# Patient Record
Sex: Male | Born: 1960 | Race: White | Hispanic: No | Marital: Married | Smoking: Current some day smoker
Health system: Southern US, Community
[De-identification: ages and names within clinical notes are randomized; demographics above are authoritative.]

## PROBLEM LIST (undated history)

## (undated) DIAGNOSIS — R918 Other nonspecific abnormal finding of lung field: Secondary | ICD-10-CM

## (undated) DIAGNOSIS — I10 Essential (primary) hypertension: Secondary | ICD-10-CM

## (undated) DIAGNOSIS — K219 Gastro-esophageal reflux disease without esophagitis: Secondary | ICD-10-CM

## (undated) DIAGNOSIS — I2510 Atherosclerotic heart disease of native coronary artery without angina pectoris: Secondary | ICD-10-CM

## (undated) DIAGNOSIS — E785 Hyperlipidemia, unspecified: Secondary | ICD-10-CM

## (undated) DIAGNOSIS — Z8739 Personal history of other diseases of the musculoskeletal system and connective tissue: Secondary | ICD-10-CM

## (undated) DIAGNOSIS — I219 Acute myocardial infarction, unspecified: Secondary | ICD-10-CM

## (undated) DIAGNOSIS — I2699 Other pulmonary embolism without acute cor pulmonale: Secondary | ICD-10-CM

## (undated) HISTORY — PX: COLONOSCOPY W/ POLYPECTOMY: SHX1380

## (undated) HISTORY — DX: Hyperlipidemia, unspecified: E78.5

## (undated) HISTORY — DX: Essential (primary) hypertension: I10

## (undated) HISTORY — PX: CORONARY ANGIOPLASTY WITH STENT PLACEMENT: SHX49

---

## 1999-09-03 ENCOUNTER — Encounter: Admission: RE | Admit: 1999-09-03 | Discharge: 1999-09-03 | Payer: Self-pay | Admitting: Gastroenterology

## 1999-09-03 ENCOUNTER — Encounter: Payer: Self-pay | Admitting: Gastroenterology

## 2000-10-28 ENCOUNTER — Encounter: Payer: Self-pay | Admitting: Gastroenterology

## 2000-10-28 ENCOUNTER — Ambulatory Visit (HOSPITAL_COMMUNITY): Admission: RE | Admit: 2000-10-28 | Discharge: 2000-10-28 | Payer: Self-pay | Admitting: Gastroenterology

## 2000-12-27 ENCOUNTER — Ambulatory Visit (HOSPITAL_COMMUNITY): Admission: RE | Admit: 2000-12-27 | Discharge: 2000-12-27 | Payer: Self-pay | Admitting: Gastroenterology

## 2002-07-27 HISTORY — PX: ESOPHAGOGASTRODUODENOSCOPY: SHX1529

## 2005-03-01 ENCOUNTER — Inpatient Hospital Stay (HOSPITAL_COMMUNITY): Admission: EM | Admit: 2005-03-01 | Discharge: 2005-03-03 | Payer: Self-pay | Admitting: Emergency Medicine

## 2005-03-02 ENCOUNTER — Encounter (INDEPENDENT_AMBULATORY_CARE_PROVIDER_SITE_OTHER): Payer: Self-pay | Admitting: Cardiology

## 2008-09-24 ENCOUNTER — Encounter: Admission: RE | Admit: 2008-09-24 | Discharge: 2008-09-24 | Payer: Self-pay | Admitting: Family Medicine

## 2010-12-12 NOTE — H&P (Signed)
[name], [name]                 ACCOUNT NO.:  [id number]   MEDICAL RECORD NO.:  [id number]          PATIENT TYPE:  INP   LOCATION:  1825                         FACILITY:  [facility]   PHYSICIAN:  Melissa L. Ladona Ridgel, MD  DATE OF BIRTH:  12-17-1960   DATE OF ADMISSION:  02/28/2005  DATE OF DISCHARGE:                                HISTORY & PHYSICAL   CHIEF COMPLAINT:  Chest pain.   PRIMARY CARE PHYSICIAN:  Dr. Manus Gunning.   HISTORY OF PRESENT ILLNESS:  The patient is a 50 year old white male with a  past medical history for hypertension, hypercholesterolemia, and GERD. The  patient states that he awoke at 1 a.m. on the night prior to admission with  substernal central chest pain radiating to both shoulders. The pain was 10  out of 10.  It was exacerbated by nothing and relieved over time. The  patient states that he got up and walked around, then he tried to go to  sleep in a recliner but really could not go back to sleep. The patient  states that he got up and went to work this morning. 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 The patient states  that he went home, sat down in a chair to rest and developed the chest pain  again, and therefore came in to the emergency room for further evaluation.   REVIEW OF SYSTEMS:  Reveals no weight loss or weight gain, no fevers, no  chills. No nausea or vomiting, no diaphoresis, no paroxysmal nocturnal  dyspnea, no orthopnea. Denies any dysuria, no hematochezia. All other review  of systems are negative.   PAST MEDICAL HISTORY:  1.  Hypertension.  2.  Hypercholesterolemia.  3.  Gastroesophageal reflux disease.   PAST SURGICAL HISTORY:  None.   SOCIAL HISTORY:  He smokes 1.5 packs per day. He denies any ethanol use at  this time. However he has had trouble with ethanol in the past. He does  smoke marijuana. 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   FAMILY HISTORY:  Mom is living with hypertension. Dad  deceased secondary to  alcohol abuse.   ALLERGIES:  No known drug allergies.   MEDICATIONS:  1.  Nexium 40 mg daily.  2.  Atenolol/hydrochlorothiazide 100/25 mg daily.  3.  Lovastatin 40 mg daily.  4.  Back in April he did have a prescription for Antabuse.   PHYSICAL EXAMINATION:  VITAL SIGNS:  Temperature is 97.1, blood pressure  148/95, pulse 56, respirations 24, saturation 100%.  GENERAL:  This is a well-developed, well-nourished white male in no acute  distress.  HEENT:  Normocephalic, atraumatic. Pupils are equal, round and reactive to  light. Extraocular movements are intact. Mucous membranes are moist. Poor  dentition.  NECK:  Supple with no JVD, no lymph nodes, no carotid bruits.  CHEST:  Clear to auscultation, no rhonchi, wheezes.  CARDIOVASCULAR:  Regular rate and rhythm, positive S1, S2, no S3, S4 noted,  no murmurs, rubs, or gallops.  ABDOMEN:  Soft, nontender, nondistended with positive  bowel sounds.  EXTREMITIES:  Show no clubbing, cyanosis or edema.  NEUROLOGIC:  He is awake, alert, oriented. Cranial nerves II-XII are intact.  Power is 5/5. Deep tendon reflexes are 2+.   LABORATORY DATA:  Sodium is 140, potassium is 2.9, chloride is 103, CO2 is  31, BUN is 16, creatinine 0.9, glucose is 120. Will check a CBC as point of  care enzymes myoglobin is 44.7. CK-MB of 33.3 and a troponin of 0.39 which  is slightly elevated. EKG shows sinus bradycardia with minor ST depressions  in V2, really nothing to speak about.   His chest x-ray shows a 7 mm nodule at the left base; need to repeat films  with nipple markers.   ASSESSMENT:  This is a 50 year old white male with past medical history for  hypertension, hypercholesterolemia, and heavy tobacco use who presents with  unstable angina without acute EKG changes.  1.  Cardiovascular. Unstable angina with increased troponin. No acute EKG      changes at this time. I agree with a nitroglycerin drip and heparin.      Will  check an echo. Continue his aspirin and Atenolol,      hydrochlorothiazide, and consult cardiology in the a.m.  2.  Pulmonary. The lung nodule needs follow up x-ray and we will recommend      tobacco cessation and a Nicoderm patch.  3.  Gastrointestinal. Has a history of gastroesophageal reflux disease. We      will continue either Nexium or Protonix.  4.  GU which is stable.  5.  Endocrine. Will check a TSH and a fasting lipid panel.  6.  The patient is a full code and full care.  7.  Will replace his potassium p.o. and IV p.r.n.       MLT/MEDQ  D:  03/01/2005  T:  03/01/2005  Job:  [id number]   cc:   Angelia Mould. Derrell Lolling, M.D.  [address]  [place]  [place] [address]

## 2010-12-12 NOTE — Procedures (Signed)
[place]  Patient:    [name], [name]                        MRN: [id number] Proc. Date: 12/27/00 Adm. Date:  [id number] Attending:  Nelda Marseille CC:         Dyanne Carrel, M.D.   Procedure Report  PROCEDURE PERFORMED:  Esophagogastroduodenoscopy with biopsies.  ENDOSCOPIST:  Petra Kuba, M.D.  INDICATIONS FOR PROCEDURE:  Morning nausea and vomiting, questionable etiology.  Consent was signed after risks, benefits, methods, and options were thoroughly discussed in the office.  MEDICATIONS USED:  Demerol 100 mg, Versed 10 mg.  DESCRIPTION OF PROCEDURE:  The video endoscope was inserted by direct vision, the esophagus was normal.  In the distal esophagus was a small hiatal hernia. Scope passed into the stomach and advanced through the antrum pertinent for some minimal antritis through a normal pylorus into a normal duodenal bulb and around the C-loop to a normal second portion of the duodenum.  The scope was withdrawn back to the bulb and a good look there ruled out abnormalities in that location.  Scope was withdrawn back to the stomach and retroflexed.  High in the cardia, the hiatal hernia was confirmed.  The fundus, angularis, lesser and greater curve were normal except for some mild gastritis.  Scope was straightened and straight visualization of the stomach confirmed the gastritis, ruled out any other abnormalities.  The scope was advanced to the antrum.  One biopsy for the CLO test was obtained to rule out Helicobacter pylori.  Scope was then slowly withdrawn.  Again a good look at the esophagus was normal except for the small hiatal hernia.  Scope was removed.  The patient tolerated the procedure well.  There was no obvious immediate complications.  ENDOSCOPIC DIAGNOSIS: 1. Small hiatal hernia. 2. Gastritis, antritis, status post CLO biopsy. 3. Otherwise normal esophagogastroduodenoscopy.  PLAN:  Increase head of  bed to 30 degrees to see if that helps.  Await CLO test and although I dont think treatment if positive would be helpful, willing to try.  Consider a trial of antidepressants next versus following up with Dr. Manus Gunning to see if he has any other ideas, work-up or plans or even sending him for a second opinion since I dont know of any other etiology to check for which I have discussed with Mr. Antonelli.  Possibly he is just so used to throwing up from his drinking, he has trained himself to throw up in the morning. DD:  12/27/00 TD:  12/27/00 Job: [id number]

## 2010-12-12 NOTE — Discharge Summary (Signed)
NAMEKEIONTE, SWICEGOOD                 ACCOUNT NO.:  [id number]   MEDICAL RECORD NO.:  [id number]          PATIENT TYPE:  INP   LOCATION:  2928                         FACILITY:  [facility]   PHYSICIAN:  Corinna L. Lendell Caprice, MDDATE OF BIRTH:  [birth year]   DATE OF ADMISSION:  02/28/2005  DATE OF DISCHARGE:  03/03/2005                                 DISCHARGE SUMMARY   DIAGNOSES:  1.  Non-ST segment elevation myocardial infarction.  2.  Hyperlipidemia.  3.  Hypertension.  4.  Gastroesophageal reflux disease.  5.  Hypokalemia.  6.  Tobacco abuse, counseled against.   DISCHARGE MEDICATIONS:  1.  Aspirin 325 milligrams a day.  2.  Lovastatin 80 milligrams a day.  3.  KCl 20 mEq a day.  4.  Plavix 75 milligrams a day.  5.  He is to continue Nexium 40 milligrams a day.  6.  Atenolol 50 milligrams a day.  7.  Hydrochlorothiazide 25 milligrams a day.   FOLLOW UP:  Dr. Amil Amen on March 20, 2005. Follow up with the [facility]  Cardiology Lab on March 10, 2005.   CONDITION:  Stable.   DIET:  Heart-healthy.   CONSULTATIONS:  Dr. Corliss Marcus.   PROCEDURE:  Cardiac catheterization on March 02, 2005 which showed two-  vessel atherosclerotic cardiovascular disease status post stent placement in  the mid-left circumflex marginal with normal ejection fraction.   PERTINENT LABORATORY DATA:  Urine drug screen positive for cannabinoids.  Troponin peaked at 0.97. CPK was 153. Peak CPK-MB was 6.3, index was 4.1.  Triglycerides 160, total cholesterol 176, HDL 29, LDL 115. TSH 2.5. Basic  metabolic panel significant for a potassium of 2.9, otherwise unremarkable.  Uric acid at 7.1. Coagulation panel normal. CBC was significant for white  count of 11,000, otherwise unremarkable. Special studies in radiology:  Chest x-ray showed a subcentimeter nodular density at the left base but with  no nipple markers, nodule, probable bronchitis. An EKG showed sinus  bradycardia with short PR interval, right  ventricular conduction delay.   HISTORY AND HOSPITAL COURSE:  Mr. Maffei is a 50 year old white male with  history of hypertension, hyperlipidemia who woke up with substernal chest  pain radiating to his shoulders. Please see H&P for complete details. His  vital signs were significant for a blood pressure 148/95, pulse 56. Fairly  unremarkable physical examination. He was noted to be hyperkalemic and had  elevated troponin. The patient was admitted to the hospital with  nitroglycerin drip and heparin drip. Cardiology was consulted. He had a  cardiac catheterization and stent placement as above. At the time of  discharge, the patient had no further chest pain due to his elevated LDL.  His Zocor was increased. He also was started on aspirin and Plavix. He also  had an echocardiogram which showed normal LV function and no significant  valvular disease or wall motion abnormalities.   The patient's potassium was repleted.      Corinna L. Lendell Caprice, MD  Electronically Signed     CLS/MEDQ  D:  07/08/2005  T:  07/09/2005  Job:  [id number]  cc:   Bryan Lemma. Manus Gunning, M.D.  Fax: [phone]

## 2010-12-12 NOTE — Cardiovascular Report (Signed)
[name], Adam NO.:  [id number]   Adam RECORD NO.:  [id number]          PATIENT TYPE:  INP   LOCATION:  2928                         FACILITY:  [facility]   PHYSICIAN:  Francisca December, M.D.  DATE OF BIRTH:  1960-08-18   DATE OF PROCEDURE:  03/02/2005  DATE OF DISCHARGE:                              CARDIAC CATHETERIZATION   PROCEDURES PERFORMED:  1.  Left heart catheterization.  2.  Left ventriculogram.  3.  Coronary angiography.  4.  PCI/DE stent implantation midleft circumflex marginal.   INDICATION:  [name] is a 50 year old man was admitted to on March 01, 2005 after prolonged chest pain. He is ruled in for non-ST-segment  myocardial infarction by elevated troponin level. He was brought to the  cardiac catheterization laboratory at this time to identify the extent of  disease and provide further therapeutic options.   PROCEDURE NOTE:  The patient is brought to the cardiac catheterization  laboratory in the fasting state. The right groin was prepped and draped in  the usual sterile fashion. Local anesthesia was obtained with infiltration  of 1% lidocaine. A 6-French catheter sheath was inserted percutaneously into  the right femoral artery utilizing an anterior  approach over a guiding J-  wire. Left heart catheterization then proceeded in the standard fashion,  using a 6-French 110 cm pigtail catheter and 6-French #4 left and right  Judkins catheters. All catheter manipulations were performed using  fluoroscopic observation and exchanges performed over a long guiding J-wire.   I then proceeded with coronary intervention. A 6-French #4 CLS guiding  catheter was advanced to the ascending aorta where left coronary os was  engaged. A 0.014 inch Luge and intracoronary guidewire was passed across the  lesion in the left circumflex marginal branch without difficulty. The  patient received 4000 units of heparin and a double bolus and constant  infusion of Integrilin. The resultant ACT was 286 seconds. The lesion was  directly stented using a 3.0/16 mm Scimed TAXUS intracoronary drug-eluting  stent. This was inflated to peak pressure of 14 atmospheres. Following  deflation and removal of the balloon, adequate patency was confirmed in  orthogonal views both with and without the guidewire in place. The guiding  catheter was then removed. A right femoral arteriogram was obtained, but  showed extensive atherosclerotic plaque at the entry site of the catheter  sheath. It was therefore sutured into place. The patient was transported to  the recovery area in stable condition.   HEMODYNAMIC RESULTS:  Systemic arterial pressure was 119/76 with a mean of  95 mmHg. There was no systolic gradient across the aortic valve. Left  ventricular end-diastolic pressure was 8 mmHg preventriculogram.   ANGIOGRAPHY:  The left ventriculogram demonstrated normal chamber size and  normal global systolic function without regional wall motion abnormality.  Visual estimated ejection fraction 65-70%. There is left coronary  calcification seen. There is no mitral regurgitation.   There was a right dominant coronary system present. The main left coronary  artery was normal   The left anterior descending artery and its  branches were moderately  diseased; the vessel contains angulated 40-50% stenosis in the midportion  just before the origin of a moderate-sized diagonal branch which has a 60-  70% stenosis at its origin. The ongoing anterior descending artery reaches  and traverses the apex. It is diffusely diseased.   The left circumflex coronary and its branches are highly diseased; the  vessel really gives rise to a single large marginal branch. In the  midportion of this, there is a tapering focal 80-90% stenosis. The ongoing  vessel is quite large.   The right coronary and its branches are moderately diseased as well. There  is a 40% stenosis in the  proximal portion. There is diffuse disease in the  junction of the mid and distal segments but without significant obstruction.  The distal vessel gives rise to a moderate size posterior descending artery  and a moderate size posterolateral segment with two left ventricular  branches, both of which are relatively small. The poster descending artery  has a 60-70% stenosis at its origin.   Collateral vessels are not seen.   Following balloon dilatation and stent implantation in the circumflex  marginal branch, there was no residual stenosis.   FINAL IMPRESSION:  1.  Atherosclerotic cardiovascular disease, two-vessel.  2.  Status post successful percutaneous coronary intervention drug-eluting      stent implantation mid-left circumflex marginal.  3.  Intact ventricular size and global systolic function. Ejection fraction      65-70%.  4.  Typical angina was reproduced with device insertion and balloon      inflation.      Francisca December, M.D.  Electronically Signed     JHE/MEDQ  D:  03/02/2005  T:  03/03/2005  Job:  [id number]   cc:   [name]. [name], M.D.  [address] [place]  [place] [address]  Fax: [phone]

## 2010-12-12 NOTE — Consult Note (Signed)
NAMETOMMEY, BARRET NO.:  [id number]   MEDICAL RECORD NO.:  [id number]          PATIENT TYPE:  INP   LOCATION:  2928                         FACILITY:  [facility]   PHYSICIAN:  Francisca December, M.D.  DATE OF BIRTH:  1960/09/17   DATE OF CONSULTATION:  03/01/2005  DATE OF DISCHARGE:                                   CONSULTATION   REASON FOR CONSULTATION:  Chest pain.   HISTORY OF PRESENT ILLNESS:  [name] is a 50 year old male without  prior cardiac history, who late Friday, February 27, 2005, to early Saturday,  February 28, 2005, developed the onset of anterior substernal aching chest pain  that radiated out to both shoulders.  This was spontaneous in nature.  He  felt the pain was severe enough that he could not sit or lie.  He got up to  walk.  It did not generally help much to walk.  The pain would wax and wane.  Each episode would last 2 to 3 minutes and then resolve.  He would sit down  to rest, and again it would start again within 10 or 15 minutes.  This  occurred multiple times Friday evening until he finally got up and got ready  for work Saturday morning.  He went to work all day and did not have any  discomfort.  However, around 10 p.m. Saturday evening, it began again, and  he presented to the emergency room around 1 a.m.  The pain was not  associated with any nausea or diaphoresis.  No particular shortness of  breath.   Cardiac risk factors are age, sex, hypertension, and tobacco abuse.  There  is no early family history of coronary disease.  He does have  hypercholesterolemia as well.   PAST MEDICAL HISTORY:  1.  Hypertension.  2.  Hypercholesterolemia.  3.  GERD.   PAST SURGICAL HISTORY:  None.   ALLERGIES:  None known.   CURRENT MEDICATIONS:  1.  Nexium 40 mg p.o. daily.  2.  Atenolol/hydrochlorothiazide 100/25 mg one p.o. daily.  3.  Lovastatin 40 mg p.o. daily.   SOCIAL HISTORY:  Smokes 1-1/2 packs of cigarettes daily.  No ethanol.   Does  use occasional cannabis.  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   FAMILY HISTORY:  Father died of ETOH abuse.  Mother is alive with  hypertension.   REVIEW OF SYSTEMS:  Otherwise negative.   PHYSICAL EXAMINATION:  GENERAL:  This is a well-appearing, thin 50 year old  man in no acute distress.  VITAL SIGNS:  Blood pressure 136/82.  Pulse 55 and regular.  Respiratory  rate 18.  Temperature 97.8.  HEENT:  Unremarkable.  Head is atraumatic and normocephalic.  Pupils are  equal and reactive to light and accommodation.  Extraocular movements are  intact.  Sclerae are anicteric.  Oral mucosa is pink and moist.  Tongue is  not coated.  NECK:  Supple without thyromegaly or masses.  Carotid upstrokes are normal.  There is no bruit.  There is no jugular  venous distention.  CHEST:  Clear with adequate excursion.  HEART:  Regular rhythm.  Normal S1 and S2 heard.  No S3, S4, murmur, click,  or rub noted.  ABDOMEN:  Soft, scaphoid, nontender.  No midline pulsatile mass.  GU:  External genitalia without lesions.  RECTAL:  Not performed.  EXTREMITIES:  Full range of motion.  No edema.  Intact distal pulses.  NEUROLOGIC:  Cranial nerves 2-12 are intact.  Motor and sensory grossly  intact.  Gait not tested.  SKIN:  Warm, dry, and clear.   LABORATORY DATA:  Electrocardiogram on admission showed normal sinus rhythm.  There are small Q-waves in I and aVL.  Admission hemogram within normal  limits.  Potassium on admission was 2.5.  It was increased to 2.9 this  morning.  Serum electrolytes otherwise normal.  BUN and creatinine normal.  On initial point of care enzymes in the emergency room, troponin was 0.39,  CK/MB 3.3, myoglobin 44.  Subsequent troponin drawn in the CCU is 0.78 with  a CK/MB of 6.3 and a total of 153.  Additional CK/MB pending at this time.  Chest x-ray with no active cardiopulmonary disease.  There may be a 7-mm  nodule at the left base.    ASSESSMENT:  1.  Acute coronary syndrome/non-ST elevation myocardial infarction.  2.  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  3.  Hypertension.  4.  Tobacco abuse.  5.  Lung nodule, left base.  6.  Hyperlipidemia.  7.  Gastroesophageal reflux disease.   PLAN:  1.  Agree with your management thus far, which includes IV nitroglycerin, IV      heparin, and aspirin.  Continue beta blocker.  2.  Repeat CK/MB and troponin today.  3.  Have counseled him for cardiac catheterization, coronary angiography,      and possible percutaneous coronary intervention.  Goals, risks, and      alternatives were reviewed.  The patient states his understanding, has      had his questions answered, and wishes to proceed.       JHE/MEDQ  D:  03/01/2005  T:  03/01/2005  Job:  [id number]

## 2011-06-21 ENCOUNTER — Other Ambulatory Visit: Payer: Self-pay

## 2011-06-21 ENCOUNTER — Encounter (HOSPITAL_COMMUNITY): Admission: EM | Disposition: A | Discharge status: Home / Self Care | Payer: Self-pay | Source: Ambulatory Visit

## 2011-06-21 ENCOUNTER — Inpatient Hospital Stay (HOSPITAL_COMMUNITY)
Admission: EM | Admit: 2011-06-21 | Discharge: 2011-06-24 | DRG: 853 | Disposition: A | Discharge status: Home / Self Care | Payer: BC Managed Care – PPO | Attending: Cardiovascular Disease | Admitting: Cardiovascular Disease

## 2011-06-21 ENCOUNTER — Inpatient Hospital Stay (HOSPITAL_COMMUNITY)
Admission: EM | Admit: 2011-06-21 | Discharge: 2011-06-21 | Disposition: A | Discharge status: Home / Self Care | Payer: No Typology Code available for payment source | Source: Ambulatory Visit

## 2011-06-21 ENCOUNTER — Encounter (HOSPITAL_COMMUNITY)
Admission: EM | Disposition: A | Discharge status: Home / Self Care | Payer: Self-pay | Source: Home / Self Care | Attending: Cardiovascular Disease

## 2011-06-21 ENCOUNTER — Emergency Department (HOSPITAL_COMMUNITY): Payer: BC Managed Care – PPO

## 2011-06-21 ENCOUNTER — Encounter: Payer: Self-pay | Admitting: *Deleted

## 2011-06-21 ENCOUNTER — Ambulatory Visit (HOSPITAL_COMMUNITY): Admit: 2011-06-21 | Payer: Self-pay | Admitting: Cardiovascular Disease

## 2011-06-21 DIAGNOSIS — K219 Gastro-esophageal reflux disease without esophagitis: Secondary | ICD-10-CM | POA: Diagnosis present

## 2011-06-21 DIAGNOSIS — R079 Chest pain, unspecified: Secondary | ICD-10-CM

## 2011-06-21 DIAGNOSIS — F17200 Nicotine dependence, unspecified, uncomplicated: Secondary | ICD-10-CM | POA: Diagnosis present

## 2011-06-21 DIAGNOSIS — E7800 Pure hypercholesterolemia, unspecified: Secondary | ICD-10-CM | POA: Diagnosis present

## 2011-06-21 DIAGNOSIS — E785 Hyperlipidemia, unspecified: Secondary | ICD-10-CM | POA: Diagnosis present

## 2011-06-21 DIAGNOSIS — Z9861 Coronary angioplasty status: Secondary | ICD-10-CM

## 2011-06-21 DIAGNOSIS — I2510 Atherosclerotic heart disease of native coronary artery without angina pectoris: Secondary | ICD-10-CM

## 2011-06-21 DIAGNOSIS — I252 Old myocardial infarction: Secondary | ICD-10-CM

## 2011-06-21 DIAGNOSIS — R110 Nausea: Secondary | ICD-10-CM | POA: Diagnosis not present

## 2011-06-21 DIAGNOSIS — Z7982 Long term (current) use of aspirin: Secondary | ICD-10-CM

## 2011-06-21 DIAGNOSIS — I10 Essential (primary) hypertension: Secondary | ICD-10-CM | POA: Diagnosis present

## 2011-06-21 DIAGNOSIS — I213 ST elevation (STEMI) myocardial infarction of unspecified site: Secondary | ICD-10-CM

## 2011-06-21 DIAGNOSIS — I2119 ST elevation (STEMI) myocardial infarction involving other coronary artery of inferior wall: Principal | ICD-10-CM | POA: Diagnosis present

## 2011-06-21 DIAGNOSIS — Z7902 Long term (current) use of antithrombotics/antiplatelets: Secondary | ICD-10-CM

## 2011-06-21 HISTORY — DX: Atherosclerotic heart disease of native coronary artery without angina pectoris: I25.10

## 2011-06-21 HISTORY — PX: LEFT HEART CATH: SHX5478

## 2011-06-21 LAB — CBC
HCT: 37.7 % — ABNORMAL LOW (ref 39.0–52.0)
Hemoglobin: 13.4 g/dL (ref 13.0–17.0)
MCH: 33 pg (ref 26.0–34.0)
MCHC: 35.5 g/dL (ref 30.0–36.0)
MCV: 92.9 fL (ref 78.0–100.0)
RDW: 11.9 % (ref 11.5–15.5)

## 2011-06-21 LAB — DIFFERENTIAL
Basophils Absolute: 0 10*3/uL (ref 0.0–0.1)
Basophils Relative: 0 % (ref 0–1)
Eosinophils Absolute: 0.1 10*3/uL (ref 0.0–0.7)
Eosinophils Relative: 1 % (ref 0–5)
Monocytes Absolute: 1.2 10*3/uL — ABNORMAL HIGH (ref 0.1–1.0)
Monocytes Relative: 7 % (ref 3–12)
Neutro Abs: 14.2 10*3/uL — ABNORMAL HIGH (ref 1.7–7.7)

## 2011-06-21 LAB — CARDIAC PANEL(CRET KIN+CKTOT+MB+TROPI)
CK, MB: 39.4 ng/mL (ref 0.3–4.0)
Relative Index: 3.1 — ABNORMAL HIGH (ref 0.0–2.5)
Total CK: 1266 U/L — ABNORMAL HIGH (ref 7–232)
Troponin I: >25 ng/mL (ref <0.30)

## 2011-06-21 LAB — PROTIME-INR
INR: 1.31 (ref 0.00–1.49)
Prothrombin Time: 16.5 seconds — ABNORMAL HIGH (ref 11.6–15.2)

## 2011-06-21 LAB — COMPREHENSIVE METABOLIC PANEL
AST: 114 U/L — ABNORMAL HIGH (ref 0–37)
Albumin: 4 g/dL (ref 3.5–5.2)
BUN: 21 mg/dL (ref 6–23)
Calcium: 9.1 mg/dL (ref 8.4–10.5)
Creatinine, Ser: 0.97 mg/dL (ref 0.50–1.35)
Total Bilirubin: 0.2 mg/dL — ABNORMAL LOW (ref 0.3–1.2)
Total Protein: 6.4 g/dL (ref 6.0–8.3)

## 2011-06-21 LAB — APTT: aPTT: 75 seconds — ABNORMAL HIGH (ref 24–37)

## 2011-06-21 SURGERY — LEFT AND RIGHT HEART CATHETERIZATION WITH CORONARY ANGIOGRAM
Anesthesia: LOCAL

## 2011-06-21 SURGERY — LEFT HEART CATH
Anesthesia: LOCAL

## 2011-06-21 MED ORDER — SODIUM CHLORIDE 0.9 % IV SOLN
250.0000 mL | INTRAVENOUS | Status: DC
  Administered 2011-06-21 – 2011-06-23 (×2): 1000 mL via INTRAVENOUS

## 2011-06-21 MED ORDER — HEPARIN SODIUM (PORCINE) 5000 UNIT/ML IJ SOLN
5000.0000 [IU] | Freq: Three times a day (TID) | INTRAMUSCULAR | Status: DC
  Administered 2011-06-22 – 2011-06-24 (×8): 5000 [IU] via SUBCUTANEOUS
  Filled 2011-06-21 (×11): qty 1

## 2011-06-21 MED ORDER — MIDAZOLAM HCL 2 MG/2ML IJ SOLN
INTRAMUSCULAR | Status: AC
  Filled 2011-06-21: qty 2

## 2011-06-21 MED ORDER — METOPROLOL TARTRATE 25 MG PO TABS: 25.0000 mg | ORAL_TABLET | Freq: Two times a day (BID) | ORAL | Status: DC

## 2011-06-21 MED ORDER — MORPHINE SULFATE 4 MG/ML IJ SOLN
4.0000 mg | Freq: Once | INTRAMUSCULAR | Status: AC
  Administered 2011-06-21: 2 mg via INTRAVENOUS

## 2011-06-21 MED ORDER — ONDANSETRON HCL 4 MG/2ML IJ SOLN
4.0000 mg | Freq: Once | INTRAMUSCULAR | Status: AC
  Administered 2011-06-21: 4 mg via INTRAVENOUS

## 2011-06-21 MED ORDER — HEPARIN BOLUS VIA INFUSION
4000.0000 [IU] | Freq: Once | INTRAVENOUS | Status: AC
  Administered 2011-06-21: 4000 [IU] via INTRAVENOUS
  Filled 2011-06-21: qty 4000

## 2011-06-21 MED ORDER — LIDOCAINE HCL (PF) 1 % IJ SOLN
INTRAMUSCULAR | Status: AC
  Filled 2011-06-21: qty 30

## 2011-06-21 MED ORDER — MORPHINE SULFATE 4 MG/ML IJ SOLN
INTRAMUSCULAR | Status: AC
  Administered 2011-06-22: 2 mg via INTRAVENOUS
  Filled 2011-06-21: qty 1

## 2011-06-21 MED ORDER — ALPRAZOLAM 0.25 MG PO TABS
0.2500 mg | ORAL_TABLET | Freq: Two times a day (BID) | ORAL | Status: DC | PRN
  Administered 2011-06-23 – 2011-06-24 (×2): 0.25 mg via ORAL
  Filled 2011-06-21 (×2): qty 1

## 2011-06-21 MED ORDER — METOPROLOL TARTRATE 12.5 MG HALF TABLET
12.5000 mg | ORAL_TABLET | Freq: Two times a day (BID) | ORAL | Status: DC
  Administered 2011-06-21: 12.5 mg via ORAL
  Filled 2011-06-21 (×3): qty 1

## 2011-06-21 MED ORDER — NITROGLYCERIN 0.4 MG SL SUBL
SUBLINGUAL_TABLET | SUBLINGUAL | Status: AC
  Filled 2011-06-21: qty 25

## 2011-06-21 MED ORDER — HEPARIN (PORCINE) IN NACL 2-0.9 UNIT/ML-% IJ SOLN
INTRAMUSCULAR | Status: AC
  Filled 2011-06-21: qty 2000

## 2011-06-21 MED ORDER — ACETAMINOPHEN 325 MG PO TABS
650.0000 mg | ORAL_TABLET | ORAL | Status: DC | PRN
  Administered 2011-06-23: 650 mg via ORAL
  Filled 2011-06-21: qty 2

## 2011-06-21 MED ORDER — FENTANYL CITRATE 0.05 MG/ML IJ SOLN
INTRAMUSCULAR | Status: AC
  Filled 2011-06-21: qty 2

## 2011-06-21 MED ORDER — PRASUGREL HCL 10 MG PO TABS
ORAL_TABLET | ORAL | Status: AC
  Administered 2011-06-22: 10 mg via ORAL
  Filled 2011-06-21: qty 6

## 2011-06-21 MED ORDER — ROSUVASTATIN CALCIUM 40 MG PO TABS: 40.0000 mg | ORAL_TABLET | Freq: Every day | ORAL | Status: DC

## 2011-06-21 MED ORDER — NITROGLYCERIN 0.2 MG/ML ON CALL CATH LAB
INTRAVENOUS | Status: AC
  Filled 2011-06-21: qty 1

## 2011-06-21 MED ORDER — ASPIRIN EC 81 MG PO TBEC
81.0000 mg | DELAYED_RELEASE_TABLET | Freq: Every day | ORAL | Status: DC
  Administered 2011-06-22 – 2011-06-24 (×3): 81 mg via ORAL
  Filled 2011-06-21 (×3): qty 1

## 2011-06-21 MED ORDER — ASPIRIN 300 MG RE SUPP
300.0000 mg | RECTAL | Status: AC
  Filled 2011-06-21: qty 1

## 2011-06-21 MED ORDER — MIDAZOLAM HCL 2 MG/2ML IJ SOLN
INTRAMUSCULAR | Status: AC
  Filled 2011-06-21: qty 1

## 2011-06-21 MED ORDER — ONDANSETRON HCL 4 MG/2ML IJ SOLN
4.0000 mg | Freq: Four times a day (QID) | INTRAMUSCULAR | Status: DC | PRN
  Administered 2011-06-22: 4 mg via INTRAVENOUS
  Filled 2011-06-21 (×2): qty 2

## 2011-06-21 MED ORDER — NITROGLYCERIN 0.4 MG SL SUBL
0.4000 mg | SUBLINGUAL_TABLET | SUBLINGUAL | Status: DC | PRN
  Administered 2011-06-21: 19:00:00 via SUBLINGUAL
  Administered 2011-06-21: 0.4 mg via SUBLINGUAL

## 2011-06-21 MED ORDER — SODIUM CHLORIDE 0.9 % IJ SOLN: 3.0000 mL | INTRAMUSCULAR | Status: DC | PRN

## 2011-06-21 MED ORDER — MORPHINE SULFATE 4 MG/ML IJ SOLN
4.0000 mg | INTRAMUSCULAR | Status: DC | PRN
  Administered 2011-06-21 (×2): 4 mg via INTRAVENOUS
  Administered 2011-06-22: 2 mg via INTRAVENOUS
  Administered 2011-06-22 (×5): 4 mg via INTRAVENOUS
  Administered 2011-06-23: 2 mg via INTRAVENOUS
  Administered 2011-06-23 (×3): 4 mg via INTRAVENOUS
  Filled 2011-06-21 (×11): qty 1

## 2011-06-21 MED ORDER — ONDANSETRON HCL 4 MG/2ML IJ SOLN
INTRAMUSCULAR | Status: AC
  Filled 2011-06-21: qty 2

## 2011-06-21 MED ORDER — ASPIRIN 81 MG PO CHEW
324.0000 mg | CHEWABLE_TABLET | ORAL | Status: AC
  Administered 2011-06-21: 324 mg via ORAL

## 2011-06-21 MED ORDER — SODIUM CHLORIDE 0.9 % IJ SOLN
3.0000 mL | Freq: Two times a day (BID) | INTRAMUSCULAR | Status: DC
  Administered 2011-06-22 – 2011-06-24 (×4): 3 mL via INTRAVENOUS

## 2011-06-21 MED ORDER — NITROGLYCERIN IN D5W 200-5 MCG/ML-% IV SOLN
INTRAVENOUS | Status: AC
  Filled 2011-06-21: qty 250

## 2011-06-21 MED ORDER — MORPHINE SULFATE 4 MG/ML IJ SOLN
INTRAMUSCULAR | Status: AC
  Filled 2011-06-21: qty 1

## 2011-06-21 MED ORDER — MORPHINE SULFATE 2 MG/ML IJ SOLN: 2.0000 mg | INTRAMUSCULAR | Status: DC | PRN

## 2011-06-21 MED ORDER — HEPARIN SODIUM (PORCINE) 5000 UNIT/ML IJ SOLN
INTRAMUSCULAR | Status: AC
  Administered 2011-06-22: 5000 [IU] via SUBCUTANEOUS
  Filled 2011-06-21: qty 1

## 2011-06-21 MED ORDER — BIVALIRUDIN 250 MG IV SOLR
INTRAVENOUS | Status: AC
  Filled 2011-06-21: qty 250

## 2011-06-21 MED ORDER — SODIUM CHLORIDE 0.9 % IV SOLN
250.0000 mL | INTRAVENOUS | Status: DC
  Administered 2011-06-21: 21:00:00 via INTRAVENOUS

## 2011-06-21 MED ORDER — NITROGLYCERIN IN D5W 200-5 MCG/ML-% IV SOLN
2.0000 ug/min | INTRAVENOUS | Status: DC
  Administered 2011-06-21: 10 ug/min via INTRAVENOUS

## 2011-06-21 MED ORDER — ROSUVASTATIN CALCIUM 10 MG PO TABS
10.0000 mg | ORAL_TABLET | Freq: Every day | ORAL | Status: DC
  Administered 2011-06-21 – 2011-06-24 (×4): 10 mg via ORAL
  Filled 2011-06-21 (×4): qty 1

## 2011-06-21 NOTE — H&P (Signed)
Primary Cardiologist: unknown  Chief Complaint:  Chest pain.  HPI: [name]; is a 50 year old gentleman with a history of coronary artery disease requiring PCI to his left circumflex artery in 2006 in the setting of an NSTEMI, hypertension, hyperlipidemia, and ongoing tobacco use who presents with abrupt onset severe substernal chest pain.  His chest pain began abruptly at around 615pm tonight (Sunday) while he was sitting at rest. Pain is substernal, crushing in nature, and is associated with significant nausea. He has vomited several times int he last hour. He does report that for the past two days he has felt episodic chest pressure similar in nature though much less severe. Based on the severity of his pain, he called EMS. In the field, a 12-lead ECG was obtained and on the basis of this result (ST segment elevation in an inferior-posterior distribution), a code STEMI was called and the cath lab was called in for emergent coronary angiography and possible coronary intervention.   Of note, the patient has had no shortness of breath, no orthopnea, no PND, no leg edema. No fevers or recent sick contacts. No diarrhea. No bleeding or bruising issues. The patient has been taking a daily aspirin and was taking clopidogrel until about a month ago.   Past Medical History: Coronary Artery Disease  - s/p NSTEMI in 2006 prompting LHCath and PCI to an OM1 branch; cath alos demonstrated non-obstructive disease in the LAD and RCA which was medically managed. LV gram 65-70%. Hypertension Hypercholesterolemia Active tobacco use Gastroesophageal reflux disease  Family History: No known premature CAD Mother with HTN Father deceased from EtOH abude  Social History:   Active cigarette smoker:  1-1.5 packs per day.  Married (wife accompanies him here)  ROS: As per HPI, otherwise is comprehensively negative  Allergies:  NKDA  Medications: Aspirin 325mg  Lovastatin 80mg  Plavix 75mg  daily (has not taken  in a month) Nexium 40mg  Atenolol 50mg  HCTZ 25mg   Exam: Afebrile HR 80 BP 160/93 RR 16 97% 2L [place] GENERAL: thin appearing man in significant distress from chest pain and nausea NECK:  Supple with no JVD, no lymph nodes, no carotid bruits. CHEST:  Clear to auscultation, no rhonchi, wheezes. CARDIOVASCULAR:  Regular rate S1, S2, no murmurs, rubs, or gallops. ABDOMEN:  Soft, nontender, nondistended with positive bowel sounds. EXTREMITIES:  Warm, well-perfused, no edema NEUROLOGIC:  He is awake, alert, oriented. SKIN warm & dry  Labs:  Pending; no results back yet  EKG: sinus rhythm with 2mm of ST depression in V2-V4 and > 1mm of ST elevation in leads II, III, AVF  Assessment:   50 year old gentleman with prior CAD and several ongoing coronary risk factors who presents with acute chest pain in the setting of an inferior STEMI. On the basis of his clinical presentation and his field ECG, he was taken emergently to the cardiac catheterization lab. Angiography was performed revealing non-obstructive disease in the LAD and LCx systems (patent prior OM1 stent), and a total obstruction in the proximal portion of his RCA. Emergent angioplasty and PCI performed by Dr. Copper with restoration of TIMI II flow in the RCA.   Plan: CAD with inferior STEMI - s/p emergent reperfusion via RCA PCI with TIMI II flow restored - Post-PCI pain still present, though improving; will follow ST segments on serial ECGs and supportively manage his improving pain with IV NTG and with IV morphine as needed - Obligatory dual anti-platelet therapy with ASA (lifelong) and Prasugrel 10mg  daily (atleast one  year) - Statin therapy with aggressive LDL goal (ideally < 70) - Mandatory smoking cessation and counseling - TTE to assess LV function given that ventriculography was not performed in the cath lab  HTN - Will follow BP trend and make adjustments accordingly - Beta-blockade with Metop 12.5mg  BID with titrations as  necessary - Would benefit from ACE-I if BP allows; Will first re-introduce beta-blockade  Hyperlipidemia - Statin therapy with aggressive LDL goal (ideally <70)  Plan discussed with patient and his questions were answered to the best of my ability.  Zacarias Pontes, M.D. Cardiology Fellow

## 2011-06-21 NOTE — H&P (Signed)
H and P update. Pt with known CAD, presented with acute inferoposterior MI 11/25. Brought directly to cardiac cath lab for emergency cath and possible PCI. Emergency consent was obtained. H &P note of Dr Maryelizabeth Kaufmann reviewed with no additions to make.

## 2011-06-21 NOTE — ED Notes (Signed)
Pt had onset of chest pain approx 1800, code stemi called pta. Given asa and 1 spray nitro pta. HR 70, Bp 160/90 pta.

## 2011-06-21 NOTE — Procedures (Signed)
Cardiac Catheterization Procedure Note  Name: [name] MRN: [id number] DOB: 04/13/1947  Procedure: Catheter placement, Selective Coronary Angiography,   PTCA/Stent of the RCA  Indication:    Diagnostic Procedure Details: The right groin was prepped, draped, and anesthetized with 1% lidocaine. Using the modified Seldinger technique, a 6 French sheath was introduced into the right femoral artery. Standard Judkins catheters were used for selective coronary angiography. Catheter exchanges were performed over a wire.  The diagnostic procedure was well-tolerated without immediate complications.  PROCEDURAL FINDINGS Hemodynamics: AO 123/70   Coronary angiography: Coronary dominance: right  Left mainstem: 40% stenosis  Left anterior descending (LAD): Patent to the LV apex. Mild nonobstructive disease noted. Prox stenosis 30-40%. 2 moderate diags are patent.  Left circumflex (LCx): Nonobstructive disease. Patent stent into OM1 with 50% ISR.  Right coronary artery (RCA): Total occlusion in mid vessel. Severe diffuse proximal stenosis prior to occlusion site.  PCI Procedure Note:  Following the diagnostic procedure, the decision was made to proceed with PCI.  Weight-based bivalirudin was given for anticoagulation. Effient 60 mg was given orally. Once a therapeutic ACT was achieved, a 6 [place] JR4 guide catheter was inserted.  A Cougar coronary guidewire was used to cross the lesion.  The lesion was predilated with a 2.5x15 mm balloon.  The lesion was then stented with overlapping 3.0x38 and 3.0x20 Promus Element DES.  The stents were postdilated with a 3.25x20 mm Rankin balloon to 18 atm throughout.  Following PCI, there was 0% residual stenosis and TIMI-3 flow. Final angiography confirmed an excellent result. Femoral hemostasis will be achieved with manual pressure once angiomax is off for 2 hours.  The patient tolerated the PCI procedure well. There were no immediate procedural complications.   The patient was transferred to the CCU for further monitoring.  Final Conclusions:   1. Acute inferior MI secondary to total occlusion of the RCA 2. Successful primary PCI of the RCA with overlapping DES 3. Moderate Lcx in-stent restenosis 4. Mild nonobstructive LAD stenosis  Recommendations: Check 2D echo tomorrow to assess LV function, post MI medical therapy. Note the patient went into atrial fib with mildly increased HR's after reperfusion (HR 80-110 bpm). He will be started on a beta blocker and hopefully will convert back to sinus rapidly.  [name] 06/21/2011, 8:28 PM

## 2011-06-21 NOTE — ED Notes (Signed)
Taken to cath lab at [facility]

## 2011-06-21 NOTE — ED Provider Notes (Signed)
History     CSN: [id number] Arrival date & time: 06/21/2011  6:53 PM   First MD Initiated Contact with Patient 06/21/11 1904      Chief Complaint  Patient presents with  . Code STEMI    (Consider location/radiation/quality/duration/timing/severity/associated sxs/prior treatment) Patient is a 50 y.o. male presenting with chest pain. The history is provided by the patient and the EMS personnel.  Chest Pain The chest pain began 3 - 5 hours ago. Chest pain occurs constantly. The chest pain is unchanged. The pain is associated with exertion. The severity of the pain is severe. The quality of the pain is described as pressure-like. The pain radiates to the left shoulder. Chest pain is worsened by exertion. Primary symptoms include shortness of breath and nausea. Pertinent negatives for primary symptoms include no fever, no cough, no wheezing, no abdominal pain and no vomiting.  Associated symptoms include diaphoresis.  Pertinent negatives for associated symptoms include no numbness and no weakness. He tried nitroglycerin (minimal improvement with ntg) for the symptoms. Risk factors include smoking/tobacco exposure.  His past medical history is significant for CAD (stent 6 yrs ago).   Code STEMI called in field by EMS.  ASA 324 mg PO and NTG given by EMS.  Past Medical History  Diagnosis Date  . MI (myocardial infarction)   . Hypertension   . Coronary artery disease     History reviewed. No pertinent past surgical history.  History reviewed. No pertinent family history.  History  Substance Use Topics  . Smoking status: Current Everyday Smoker -- 1.0 packs/day for 20 years    Types: Cigarettes  . Smokeless tobacco: Not on file  . Alcohol Use: No      Review of Systems  Constitutional: Positive for diaphoresis. Negative for fever, chills and activity change.  HENT: Negative for congestion and neck pain.   Respiratory: Positive for shortness of breath. Negative for cough, chest  tightness and wheezing.   Cardiovascular: Positive for chest pain.  Gastrointestinal: Positive for nausea. Negative for vomiting, abdominal pain, diarrhea and abdominal distention.  Genitourinary: Negative for difficulty urinating.  Musculoskeletal: Negative for gait problem.  Skin: Negative for rash.  Neurological: Negative for weakness and numbness.  Psychiatric/Behavioral: Negative for behavioral problems and confusion.  All other systems reviewed and are negative.    Allergies  Review of patient's allergies indicates no known allergies.  Home Medications  No current outpatient prescriptions on file.  BP 111/75  Pulse 75  Temp(Src) 98.2 F (36.8 C) (Oral)  Resp 18  SpO2 99%  Physical Exam  Nursing note and vitals reviewed. Constitutional: He is oriented to person, place, and time. He appears well-developed and well-nourished.       Uncomfortable appearing  HENT:  Head: Normocephalic.  Nose: Nose normal.  Eyes: EOM are normal. Pupils are equal, round, and reactive to light.  Neck: Normal range of motion. Neck supple. No JVD present.  Cardiovascular: Regular rhythm and intact distal pulses.   Pulmonary/Chest: Effort normal and breath sounds normal. No respiratory distress.  Abdominal: Soft. Bowel sounds are normal. He exhibits no distension. There is no tenderness.  Musculoskeletal: Normal range of motion. He exhibits no edema and no tenderness.       No calf ttp  Neurological: He is alert and oriented to person, place, and time.       Normal strength  Skin: Skin is warm.  Psychiatric: He has a normal mood and affect. His behavior is normal. Thought content normal.  ED Course  Procedures (including critical care time)    Date: 06/21/2011  Rate: 75  Rhythm: sinus arrhythmia  QRS Axis: normal  Intervals: QT prolonged  ST/T Wave abnormalities: ST elevations inferiorly and ST depressions anteriorly  Conduction Disutrbances:none  Narrative Interpretation: STEMI   Old EKG Reviewed: none available     Labs Reviewed  CBC - Abnormal; Notable for the following:    WBC 18.3 (*)    RBC 4.06 (*)    HCT 37.7 (*)    All other components within normal limits  DIFFERENTIAL - Abnormal; Notable for the following:    Neutro Abs 14.2 (*)    Monocytes Absolute 1.2 (*)    All other components within normal limits  COMPREHENSIVE METABOLIC PANEL - Abnormal; Notable for the following:    CO2 18 (*)    Glucose, Bld 140 (*)    AST 114 (*)    Total Bilirubin 0.2 (*)    All other components within normal limits  APTT - Abnormal; Notable for the following:    aPTT 75 (*)    All other components within normal limits  PROTIME-INR - Abnormal; Notable for the following:    Prothrombin Time 16.5 (*)    All other components within normal limits  CARDIAC PANEL(CRET KIN+CKTOT+MB+TROPI) - Abnormal; Notable for the following:    Total CK 1266 (*)    CK, MB 39.4 (*)    Troponin I >25.00 (*)    Relative Index 3.1 (*)    All other components within normal limits  MRSA PCR SCREENING  CARDIAC PANEL(CRET KIN+CKTOT+MB+TROPI)  CARDIAC PANEL(CRET KIN+CKTOT+MB+TROPI)  CBC  BASIC METABOLIC PANEL  PROTIME-INR  CBC  CREATININE, SERUM  LIPID PANEL   No results found.   No diagnosis found. Diagnosis: ST elevation MI     MDM  Pt with h/o CAD and circumflex stent 6 yrs ago here with STEMI.  Code STEMI called in field.  ASA by EMS.  Cardiology at bedside when pt arrived.  Heparin bolus given.  Pt to get additional antiplatelet in cath lab.  Immediately taken to cath lab.  No hemodynamic compromise.        Milus Glazier 06/21/11 2346

## 2011-06-22 ENCOUNTER — Other Ambulatory Visit: Payer: Self-pay

## 2011-06-22 ENCOUNTER — Inpatient Hospital Stay (HOSPITAL_COMMUNITY): Payer: No Typology Code available for payment source

## 2011-06-22 LAB — LIPID PANEL
Cholesterol: 132 mg/dL (ref 0–200)
Total CHOL/HDL Ratio: 3.8 RATIO
Triglycerides: 98 mg/dL (ref <150)
VLDL: 20 mg/dL (ref 0–40)

## 2011-06-22 LAB — CBC
HCT: 36.6 % — ABNORMAL LOW (ref 39.0–52.0)
MCV: 94.6 fL (ref 78.0–100.0)
RDW: 12.1 % (ref 11.5–15.5)
WBC: 16.7 10*3/uL — ABNORMAL HIGH (ref 4.0–10.5)

## 2011-06-22 LAB — POCT I-STAT, CHEM 8
Calcium, Ion: 1.14 mmol/L (ref 1.12–1.32)
Chloride: 99 mEq/L (ref 96–112)
HCT: 37 % — ABNORMAL LOW (ref 39.0–52.0)
Potassium: 3.4 mEq/L — ABNORMAL LOW (ref 3.5–5.1)
Sodium: 132 mEq/L — ABNORMAL LOW (ref 135–145)

## 2011-06-22 LAB — BASIC METABOLIC PANEL
BUN: 15 mg/dL (ref 6–23)
Chloride: 104 mEq/L (ref 96–112)
Creatinine, Ser: 0.78 mg/dL (ref 0.50–1.35)
GFR calc Af Amer: >90 mL/min (ref >90)

## 2011-06-22 LAB — CARDIAC PANEL(CRET KIN+CKTOT+MB+TROPI): Total CK: 2146 U/L — ABNORMAL HIGH (ref 7–232)

## 2011-06-22 LAB — POCT ACTIVATED CLOTTING TIME: Activated Clotting Time: 496 seconds

## 2011-06-22 MED ORDER — OXYCODONE-ACETAMINOPHEN 5-325 MG PO TABS
2.0000 | ORAL_TABLET | Freq: Once | ORAL | Status: AC
  Administered 2011-06-22: 2 via ORAL
  Filled 2011-06-22: qty 2

## 2011-06-22 MED ORDER — PROMETHAZINE HCL 25 MG/ML IJ SOLN
12.5000 mg | Freq: Four times a day (QID) | INTRAMUSCULAR | Status: DC | PRN
  Administered 2011-06-22: 12.5 mg via INTRAVENOUS
  Filled 2011-06-22: qty 1

## 2011-06-22 MED ORDER — LISINOPRIL 10 MG PO TABS
10.0000 mg | ORAL_TABLET | Freq: Every day | ORAL | Status: DC
  Administered 2011-06-22 – 2011-06-23 (×2): 10 mg via ORAL
  Filled 2011-06-22 (×2): qty 1

## 2011-06-22 MED ORDER — METOPROLOL TARTRATE 25 MG PO TABS
25.0000 mg | ORAL_TABLET | Freq: Two times a day (BID) | ORAL | Status: DC
  Filled 2011-06-22: qty 1

## 2011-06-22 MED ORDER — METOPROLOL TARTRATE 12.5 MG HALF TABLET
12.5000 mg | ORAL_TABLET | Freq: Once | ORAL | Status: AC
  Administered 2011-06-22: 12.5 mg via ORAL
  Filled 2011-06-22: qty 1

## 2011-06-22 MED ORDER — PRASUGREL HCL 10 MG PO TABS
10.0000 mg | ORAL_TABLET | Freq: Every day | ORAL | Status: DC
  Administered 2011-06-22 – 2011-06-24 (×3): 10 mg via ORAL
  Filled 2011-06-22 (×3): qty 1

## 2011-06-22 MED ORDER — METOPROLOL TARTRATE 25 MG PO TABS
25.0000 mg | ORAL_TABLET | Freq: Four times a day (QID) | ORAL | Status: DC
  Administered 2011-06-22 – 2011-06-24 (×9): 25 mg via ORAL
  Filled 2011-06-22 (×11): qty 1

## 2011-06-22 MED ORDER — ATROPINE SULFATE 1 MG/ML IJ SOLN
INTRAMUSCULAR | Status: AC
  Filled 2011-06-22: qty 1

## 2011-06-22 NOTE — Progress Notes (Signed)
Pt states pain to Lt shoulder improves w/mobility (ROM) the patient currently denies c/p .

## 2011-06-22 NOTE — Progress Notes (Signed)
Rt groin arterial Sheath pulled per protocol .Approx 3 cm hematoma at entry site was noted prior to sheath pull the patient toll procedure well .Pressure held x 30 min then pressure drsg applied. Scant ( approx 3 cm ) bruise to entry site yet skin very supple .Activity level discussed w/Pt .Plan of care reviewed w/Pt .VSS .

## 2011-06-22 NOTE — Consult Note (Signed)
Pt smokes 1 ppd and is in action stage, eager to quit. Recommended the patch. 21 mg patch x 6 weeks, 14 mg patch x 2 weeks and 7 mg patch x 2 weeks. Instructed pt on patch use directions. Referred to 1-800 quit now for f/u and support. Discussed oral fixation substitutes, second hand smoke and in home smoking policy. Reviewed and gave pt Written education/contact information.

## 2011-06-22 NOTE — Progress Notes (Signed)
Subjective: Patient has been having mild-moderate left sided CP on and off since PPCI last night despite being on a NTG gtt. Also note that his rhythm has been between sinus with runs of NSVT.   Objective: Vital signs in last 24 hours: Filed Vitals:   06/22/11 0300 06/22/11 0400 06/22/11 0500 06/22/11 0545  BP: 139/83 127/95 130/82 125/95  Pulse: 64 69 70   Temp:  97.8 F (36.6 C)    TempSrc:  Oral    Resp: 14 11 11 15   Height:      Weight:      SpO2: 100% 98% 99% 98%   Weight change:   Intake/Output Summary (Last 24 hours) at 06/22/11 0720 Last data filed at 06/22/11 0500  Gross per 24 hour  Intake 605.75 ml  Output    475 ml  Net 130.75 ml   Physical Exam: GENERAL: thin appearing man in laying in bed, NAD NECK: Supple with no JVD, no lymph nodes, no carotid bruits.  CHEST: Clear to auscultation, no rhonchi, wheezes.  CARDIOVASCULAR: Regular rate S1, S2, no murmurs, rubs, or gallops.  ABDOMEN: Soft, nontender, nondistended with positive bowel sounds.  EXTREMITIES: Warm, well-perfused, no edema  NEUROLOGIC: He is awake, alert, oriented.  SKIN warm & dry  Lab Results: Basic Metabolic Panel:  Lab 06/22/11 1610 06/21/11 2055  NA 135 135  K 4.0 3.9  CL 104 104  CO2 20 18*  GLUCOSE 125* 140*  BUN 15 21  CREATININE 0.78 0.97  CALCIUM 8.8 9.1  MG -- --  PHOS -- --   Liver Function Tests:  Lab 06/21/11 2055  AST 114*  ALT 27  ALKPHOS 57  BILITOT 0.2*  PROT 6.4  ALBUMIN 4.0   CBC:  Lab 06/22/11 0442 06/21/11 2055  WBC 16.7* 18.3*  NEUTROABS -- 14.2*  HGB 12.9* 13.4  HCT 36.6* 37.7*  MCV 94.6 92.9  PLT 214 226   Cardiac Enzymes:  Lab 06/22/11 0442 06/21/11 2055  CKTOTAL 2146* 1266*  CKMB 132.6* 39.4*  CKMBINDEX -- --  TROPONINI >25.00* >25.00*   Fasting Lipid Panel:  Lab 06/22/11 0442  CHOL 132  HDL 35*  LDLCALC 77  TRIG 98  CHOLHDL 3.8  LDLDIRECT --   Coagulation:  Lab 06/22/11 0442 06/21/11 2055  LABPROT 13.4 16.5*  INR 1.00 1.31     Medications:  Scheduled Meds:   . aspirin  324 mg Oral NOW   Or  . aspirin  300 mg Rectal NOW  . aspirin EC  81 mg Oral Daily  . atropine      . bivalirudin      . fentaNYL      . heparin  4,000 Units Intravenous Once  . heparin      . heparin      . heparin  5,000 Units Subcutaneous Q8H  . lidocaine      . metoprolol tartrate  12.5 mg Oral Once  . metoprolol tartrate  25 mg Oral BID  . midazolam      . midazolam      .  morphine injection  4 mg Intravenous Once  . morphine      . nitroGLYCERIN      . nitroGLYCERIN      . ondansetron (ZOFRAN) IV  4 mg Intravenous Once  . oxyCODONE-acetaminophen  2 tablet Oral Once  . prasugrel      . rosuvastatin  10 mg Oral Daily  . sodium chloride  3 mL Intravenous Q12H  .  DISCONTD: metoprolol tartrate  12.5 mg Oral BID  . DISCONTD: nitroGLYCERIN       Continuous Infusions:   . sodium chloride 250 mL (06/21/11 2200)  . nitroGLYCERIN 35 mcg/min (06/22/11 0500)  . DISCONTD: sodium chloride 10 mL/hr at 06/21/11 2116   PRN Meds:.acetaminophen, ALPRAZolam, morphine injection, nitroGLYCERIN, ondansetron (ZOFRAN) IV, sodium chloride, DISCONTD:  morphine injection Assessment 50 year old male with prior CAD, presents with inferior STEMI on 11/15. Had PPCI to proximal portion of his RCA. Emergent angioplasty and PCI performed by Dr. Copper with restoration of TIMI II flow in the RCA.   Plan: -Consider starting ACE inhibitor today -Increase metoprolol to 25mg  q6h. -Continue Prasagrel indefinitely  -Continue to trend cardiac enzymes -Smoking cessation counseling  -Continue to monitor tele rhythm   LOS: 1 day   TOBBIA,PATRICK 06/22/2011, 7:20 AM  I have taken a history, reviewed medications, allergies, PMH, SH, FH, and reviewed ROS and examined the patient.  I agree with the assessment and plan. Will increase metoprolol to q 6 hrs, add ACEI, and hold off on Echo since will not change our plan. Will need Echo in 6 weeks to assess LV  systolic function.  Tihanna Goodson C. Daleen Squibb, MD, [facility] Pager:  [phone]

## 2011-06-22 NOTE — ED Provider Notes (Signed)
I saw and evaluated the patient, reviewed the resident's note and I agree with the findings and plan, ekg interpretation (reviewed by me)  Continues to have CP on arrival to ED. RRR, CTAB. STEMI activation in field. Cardiology at bedside on arrival. To cath lab for PCI.  Forbes Cellar, MD 06/22/11 2133

## 2011-06-22 NOTE — Progress Notes (Signed)
   CARE MANAGEMENT NOTE 06/22/2011  Patient:  [name]   Account Number:  [id number]  Date Initiated:  06/22/2011  Documentation initiated by:  GRAVES-BIGELOW,Tjuana Vickrey  Subjective/Objective Assessment:   Pt in with stemi. Plan for d/c on effient. CM will provide pt with 30 day free/copay card. CM will place cards in shadow chart. MD please write rx for 30 day free without refills and original with refills.     Action/Plan:   Anticipated DC Date:  06/24/2011   Anticipated DC Plan:  HOME/SELF CARE      DC Planning Services  CM consult      Choice offered to / List presented to:             Status of service:  Completed, signed off Medicare Important Message given?   (If response is "NO", the following Medicare IM given date fields will be blank) Date Medicare IM given:   Date Additional Medicare IM given:    Discharge Disposition:  HOME/SELF CARE  Per UR Regulation:    Comments:  06-22-11 1625 Tomi Bamberger, RN, BSN [phone] CM will continue to monitor for additional d/c needs.

## 2011-06-23 DIAGNOSIS — I2119 ST elevation (STEMI) myocardial infarction involving other coronary artery of inferior wall: Secondary | ICD-10-CM

## 2011-06-23 LAB — CARDIAC PANEL(CRET KIN+CKTOT+MB+TROPI)
CK, MB: 55 ng/mL (ref 0.3–4.0)
Relative Index: 3.8 — ABNORMAL HIGH (ref 0.0–2.5)
Total CK: 1455 U/L — ABNORMAL HIGH (ref 7–232)
Troponin I: >25 ng/mL (ref <0.30)

## 2011-06-23 MED ORDER — PANTOPRAZOLE SODIUM 40 MG PO TBEC
40.0000 mg | DELAYED_RELEASE_TABLET | Freq: Every day | ORAL | Status: DC
  Administered 2011-06-23 – 2011-06-24 (×2): 40 mg via ORAL
  Filled 2011-06-23 (×2): qty 1

## 2011-06-23 MED ORDER — LISINOPRIL 20 MG PO TABS
20.0000 mg | ORAL_TABLET | Freq: Every day | ORAL | Status: DC
  Administered 2011-06-24: 20 mg via ORAL
  Filled 2011-06-23: qty 1

## 2011-06-23 NOTE — Progress Notes (Signed)
Subjective: Patient denies CP, but does report symptoms of GERD this morning, had no major events overnight and no further runs of NSVT.  Objective: Vital signs in last 24 hours: Filed Vitals:   06/23/11 0300 06/23/11 0400 06/23/11 0500 06/23/11 0600  BP: 110/73 108/68 105/64   Pulse: 87 86 81 85  Temp:  99.4 F (37.4 C)    TempSrc:  Oral    Resp: 17 18 18 17   Height:      Weight:      SpO2: 96% 94% 97% 97%   Weight change:   Intake/Output Summary (Last 24 hours) at 06/23/11 0715 Last data filed at 06/23/11 0600  Gross per 24 hour  Intake   1680 ml  Output   1125 ml  Net    555 ml   Physical Exam: GENERAL: laying in bed, NAD  NECK: Supple with no JVD, no lymph nodes, no carotid bruits.  CHEST: Clear to auscultation, no rhonchi, wheezes.  CARDIOVASCULAR: Regular rate S1, S2, no murmurs, rubs, or gallops.  ABDOMEN: Soft, nontender, nondistended with positive bowel sounds.  EXTREMITIES: Warm, well-perfused, no edema  NEUROLOGIC: He is awake, alert, oriented.  SKIN warm & dry  Lab Results: Basic Metabolic Panel:  Lab 06/22/11 6578 06/21/11 2055  NA 135 135  K 4.0 3.9  CL 104 104  CO2 20 18*  GLUCOSE 125* 140*  BUN 15 21  CREATININE 0.78 0.97  CALCIUM 8.8 9.1  MG -- --  PHOS -- --   Liver Function Tests:  Lab 06/21/11 2055  AST 114*  ALT 27  ALKPHOS 57  BILITOT 0.2*  PROT 6.4  ALBUMIN 4.0   CBC:  Lab 06/22/11 0442 06/21/11 2055  WBC 16.7* 18.3*  NEUTROABS -- 14.2*  HGB 12.9* 13.4  HCT 36.6* 37.7*  MCV 94.6 92.9  PLT 214 226   Cardiac Enzymes:  Lab 06/22/11 0442 06/21/11 2055  CKTOTAL 2146* 1266*  CKMB 132.6* 39.4*  CKMBINDEX -- --  TROPONINI >25.00* >25.00*   Fasting Lipid Panel:  Lab 06/22/11 0442  CHOL 132  HDL 35*  LDLCALC 77  TRIG 98  CHOLHDL 3.8  LDLDIRECT --   Coagulation:  Lab 06/22/11 0442 06/21/11 2055  LABPROT 13.4 16.5*  INR 1.00 1.31    Micro Results: Recent Results (from the past 240 hour(s))  MRSA PCR SCREENING      Status: Normal   Collection Time   06/21/11  9:18 PM      Component Value Range Status Comment   MRSA by PCR NEGATIVE  NEGATIVE  Final    Studies/Results: No results found. Medications:   Scheduled Meds:   . aspirin EC  81 mg Oral Daily  . heparin  5,000 Units Subcutaneous Q8H  . lisinopril  10 mg Oral Daily  . metoprolol tartrate  25 mg Oral QID  . nitroGLYCERIN      . prasugrel  10 mg Oral Daily  . rosuvastatin  10 mg Oral Daily  . sodium chloride  3 mL Intravenous Q12H  . DISCONTD: metoprolol tartrate  25 mg Oral BID   Continuous Infusions:   . sodium chloride 1,000 mL (06/23/11 0204)  . nitroGLYCERIN Stopped (06/22/11 1345)   PRN Meds:.acetaminophen, ALPRAZolam, morphine injection, nitroGLYCERIN, ondansetron (ZOFRAN) IV, promethazine, sodium chloride  Assessment and Plan: 50 year old male with prior CAD, presents with inferior STEMI on 11/15. Had PPCI to proximal portion of his RCA. Emergent angioplasty and PCI performed by Dr. Copper with restoration of TIMI II  flow in the RCA. Had several runs of NSVT on 11/16, this however ceased after his BB was increased.   Inferior STEMI, s/p RCA DES placement x2 on 11/25. -Transfer to SDU -Continue Prasagrel indefinitely  -D/C IVF -2D Echo in 6 weeks at outpatient followup.  HTN -Continue Lisniopril and Metoprolol at current dosage  HLD -LDL 77, was on lovastatin 80mg  at home, now on Crestor 10mg  in the acute setting. On hospital d/c consider switching patient to Atorvastatin 20-40mg .  GERD -Start Protonix  Nausea -Well controlled on Zofran/Promethazine -Advance diet to full liquids.   LOS: 2 days   TOBBIA,PATRICK 06/23/2011, 7:15 AM  Attending Note:   The patient was seen and examined.  Agree with assessment and plan as noted above.  Exam is unremarkable.  HR remains a bit high.  Pt is doing well. Continues to have mild chest soreness.   ECG shows mild ST elevation c/w aneurism inferiorly.  Transfer to  step down. Continue to increase BB and ACE-I.   Vesta Mixer, Montez Hageman., MD, [facility] 06/23/2011, 9:07 AM

## 2011-06-23 NOTE — Progress Notes (Signed)
CARDIAC REHAB PHASE I   PRE:  Rate/Rhythm: 82 SR  BP:  Supine: 98/65  Sitting:   Standing:    SaO2:   MODE:  Ambulation: 780 ft   POST:  Rate/Rhythem: 94 SR  BP:  Supine:   Sitting: 95/69  Standing:    SaO2:  1405-1520   Tolerated ambulation well without c/o of cp or SOB. Gait steady.States that his chest is sore. Completed MI education with pt. and wife. He declines Outpt. CRP due to his long work hours.  Beatrix Fetters

## 2011-06-24 ENCOUNTER — Inpatient Hospital Stay (HOSPITAL_COMMUNITY): Payer: BC Managed Care – PPO

## 2011-06-24 DIAGNOSIS — I219 Acute myocardial infarction, unspecified: Secondary | ICD-10-CM

## 2011-06-24 MED ORDER — PANTOPRAZOLE SODIUM 40 MG PO TBEC: 40.0000 mg | DELAYED_RELEASE_TABLET | Freq: Every day | ORAL | Status: DC

## 2011-06-24 MED ORDER — ASPIRIN 81 MG PO TBEC: 81.0000 mg | DELAYED_RELEASE_TABLET | Freq: Every day | ORAL | Status: AC

## 2011-06-24 MED ORDER — SIMVASTATIN 40 MG PO TABS: 40.0000 mg | ORAL_TABLET | Freq: Every day | ORAL | Status: DC

## 2011-06-24 MED ORDER — NITROGLYCERIN 0.4 MG SL SUBL: 0.4000 mg | SUBLINGUAL_TABLET | SUBLINGUAL | Status: DC | PRN

## 2011-06-24 MED ORDER — LISINOPRIL 20 MG PO TABS: 20.0000 mg | ORAL_TABLET | Freq: Every day | ORAL | Status: DC

## 2011-06-24 MED ORDER — ATORVASTATIN CALCIUM 80 MG PO TABS: 80.0000 mg | ORAL_TABLET | Freq: Every day | ORAL | Status: DC

## 2011-06-24 MED ORDER — PRASUGREL HCL 10 MG PO TABS: 10.0000 mg | ORAL_TABLET | Freq: Every day | ORAL | Status: DC

## 2011-06-24 MED ORDER — METOPROLOL TARTRATE 50 MG PO TABS: 50.0000 mg | ORAL_TABLET | Freq: Two times a day (BID) | ORAL | Status: DC

## 2011-06-24 NOTE — Progress Notes (Signed)
Subjective:  Feels much better. No chest pain or dyspnea.  Objective:  Vital Signs in the last 24 hours: Temp:  [98 F (36.7 C)-100 F (37.8 C)] 99.1 F (37.3 C) (11/28 0739) Pulse Rate:  [77-99] 99  (11/28 0739) Resp:  [15-22] 20  (11/28 0406) BP: (90-103)/(59-72) 103/70 mmHg (11/28 0739) SpO2:  [95 %-99 %] 96 % (11/28 0739)  Intake/Output from previous day: 11/27 0701 - 11/28 0700 In: 547 [P.O.:480; I.V.:67] Out: 675 [Urine:675]  Physical Exam: Pt is alert and oriented, NAD HEENT: normal Neck: JVP - normal, carotids 2+= without bruits Lungs: CTA bilaterally CV: RRR without murmur or gallop Abd: soft, NT, Positive BS, no hepatomegaly Ext: no C/C/E, distal pulses intact and equal Right groin with mild ecchymoses Skin: warm/dry no rash  Lab Results:  Basename 06/22/11 0442 06/21/11 2055  WBC 16.7* 18.3*  HGB 12.9* 13.4  PLT 214 226    Basename 06/22/11 0442 06/21/11 2055  NA 135 135  K 4.0 3.9  CL 104 104  CO2 20 18*  GLUCOSE 125* 140*  BUN 15 21  CREATININE 0.78 0.97    Basename 06/23/11 0930 06/22/11 0442  TROPONINI >25.00* >25.00*    Tele:  Sinus rhythm  Assessment/Plan:  1. Inferior MI - s/p overlapping DES. Continue ASA 81 mg, effient 10 mg daily for minimum 12 months. Needs 2D echo as he has had no assessment of LV function. Continue beta blocker and ACE-I. 2. HTN - cont metoprolol and lisinopril 3. Hyperlipidemia - recommend atorvastatin 80 mg at discharge 4. Dispo - home after echo completed. 5. Tobacco - cessation counseling done. Pt determined to quit.   [name], M.D. 06/24/2011, 9:08 AM

## 2011-06-24 NOTE — Progress Notes (Signed)
  Echocardiogram 2D Echocardiogram has been performed.  Dewitt Hoes, RDCS 06/24/2011, 3:05 PM

## 2011-06-24 NOTE — Progress Notes (Signed)
CARDIAC REHAB PHASE I   PRE:  Rate/Rhythm: 84SR  BP:  Supine: 107/69  Sitting:   Standing:    SaO2:   MODE:  Ambulation: 700 ft   POST:  Rate/Rhythem: 100SR  BP:  Supine: 100/67  Sitting:   Standing:    SaO2:   Pt walked 700 ft with steady gait. Tired by end of walk. Wants to go home.Denied CP> [id number]  Duanne Limerick

## 2011-06-24 NOTE — Discharge Summary (Signed)
Physician Discharge Summary  Patient ID: JAECOB LOWDEN MRN: [id number] DOB/AGE: 1961/03/22 50 y.o.  Admit date: 06/21/2011 Discharge date: 06/24/2011  Primary Discharge Diagnosis: Inferior STEMI  Secondary Discharge Diagnosis:  Patient Active Problem List  Diagnoses  . Coronary artery disease  . Hypertension   Ongoing Tobacco Use    nonsustained VT, possibly reperfusion arrhythmia   . Hyperlipidemia    Significant Diagnostic Studies: Cardiac catheter placement, Selective Coronary Angiography, PTCA/Stent of the RCA, 2-d echo, CXR   Hospital Course: Mr. Vilardi is a 50 year old male with a history of coronary artery disease. He had chest pain he came to the hospital where he is EKG was consistent with an ST elevation MI. He was taken directly to the cath lab. Cardiac cath results: Left mainstem: 40% stenosis  Left anterior descending (LAD): Patent to the LV apex. Mild nonobstructive disease noted. Prox stenosis 30-40%. 2 moderate diags are patent.  Left circumflex (LCx): Nonobstructive disease. Patent stent into OM1 with 50% ISR.  Right coronary artery (RCA): Total occlusion in mid vessel. Severe diffuse proximal stenosis prior to occlusion site.  The lesion was then stented with overlapping 3.0x38 and 3.0x20 Promus Element DES. Following PCI, there was 0% residual stenosis and TIMI-3 flow.   He tolerated the procedure well and the sheath was removed without difficulty. He was seen by cardiac rehabilitation post procedure and smoking cessation. A nicotine patch is appropriate if he will abstain from cigarettes. He had some reflux symptoms and was started on protonix. He had some brief runs of nonsustained VT but was asymptomatic. He had been started on a beta blocker and a statin increased. The beta blocker was uptitrated as his blood pressure and heart rate would allow. I 06/24/2011 he was ambulating without chest pain or shortness of breath. He was evaluated by Dr. Excell Seltzer and considered  stable for discharge, to followup as an outpatient.  Labs:  Lab Results  Component Value Date   WBC 16.7* 06/22/2011   HGB 12.9* 06/22/2011   HCT 36.6* 06/22/2011   MCV 94.6 06/22/2011   PLT 214 06/22/2011    Lab 06/22/11 0442 06/21/11 2055  NA 135 --  K 4.0 --  CL 104 --  CO2 20 --  BUN 15 --  CREATININE 0.78 --  CALCIUM 8.8 --  PROT -- 6.4  BILITOT -- 0.2*  ALKPHOS -- 57  ALT -- 27  AST -- 114*  GLUCOSE 125* --   Lab Results  Component Value Date   CKTOTAL 1455* 06/23/2011   CKMB 55.0* 06/23/2011   TROPONINI >25.00* 06/23/2011    Lab Results  Component Value Date   CHOL 132 06/22/2011   Lab Results  Component Value Date   HDL 35* 06/22/2011   Lab Results  Component Value Date   LDLCALC 77 06/22/2011   Lab Results  Component Value Date   TRIG 98 06/22/2011   Lab Results  Component Value Date   CHOLHDL 3.8 06/22/2011   No results found for this basename: LDLDIRECT      Radiology: IMPRESSION: Minimal chronic bronchitic changes. Right coronary stents. No acute abnormalities   FOLLOW UP PLANS AND APPOINTMENTS Discharge Orders    Future Appointments: Provider: Department: Dept Phone: Center:   07/06/2011 2:00 PM Beatrice Lecher, PA [facility] [phone] [facility]     Current Discharge Medication List    START taking these medications   Details  aspirin EC 81 MG EC tablet Take 1 tablet (81 mg total) by mouth daily.  atorvastatin (LIPITOR) 80 MG tablet Take 1 tablet (80 mg total) by mouth daily. Qty: 30 tablet, Refills: 11    lisinopril (PRINIVIL,ZESTRIL) 20 MG tablet Take 1 tablet (20 mg total) by mouth daily. Qty: 30 tablet, Refills: 11    metoprolol tartrate (LOPRESSOR) 50 MG tablet Take 1 tablet (50 mg total) by mouth 2 (two) times daily. Qty: 60 tablet, Refills: 11    nitroGLYCERIN (NITROSTAT) 0.4 MG SL tablet Place 1 tablet (0.4 mg total) under the tongue every 5 (five) minutes as needed for chest pain. Qty: 25 tablet,  Refills: 4    !! prasugrel (EFFIENT) 10 MG TABS Take 1 tablet (10 mg total) by mouth daily. Qty: 30 tablet, Refills: 11    !! prasugrel (EFFIENT) 10 MG TABS Take 1 tablet (10 mg total) by mouth daily. Qty: 30 tablet, Refills: 0     Nicotine patches daily   Protonix 40 mg daily   STOP taking these medications     aspirin 325 MG tablet      atenolol (TENORMIN) 50 MG tablet      clopidogrel (PLAVIX) 75 MG tablet      simvastatin (ZOCOR) 80 MG tablet        Follow-up Information    Follow up with Tereso Newcomer, PA. (12/10 @ 2pm)    Contact information:   [address] [place] [address] [phone]          BRING ALL MEDICATIONS WITH YOU TO FOLLOW UP APPOINTMENTS  Time spent with patient to include physician time: Signed: Theodore Demark 06/24/2011, 2:31 PM Co-Sign MD

## 2011-06-29 NOTE — Discharge Summary (Signed)
Patient seen and evaluated at time of discharge. Please see my progress note that same day. Agree with documentation as per Ms. Barrett.

## 2011-07-03 ENCOUNTER — Encounter: Payer: Self-pay | Admitting: *Deleted

## 2011-07-06 ENCOUNTER — Ambulatory Visit (INDEPENDENT_AMBULATORY_CARE_PROVIDER_SITE_OTHER): Payer: BC Managed Care – PPO | Admitting: Physician Assistant

## 2011-07-06 ENCOUNTER — Encounter: Payer: Self-pay | Admitting: Physician Assistant

## 2011-07-06 ENCOUNTER — Encounter: Payer: Self-pay | Admitting: *Deleted

## 2011-07-06 VITALS — BP 104/66 | HR 52 | Ht 69.0 in | Wt 145.1 lb

## 2011-07-06 DIAGNOSIS — I2510 Atherosclerotic heart disease of native coronary artery without angina pectoris: Secondary | ICD-10-CM

## 2011-07-06 DIAGNOSIS — I498 Other specified cardiac arrhythmias: Secondary | ICD-10-CM

## 2011-07-06 DIAGNOSIS — I10 Essential (primary) hypertension: Secondary | ICD-10-CM

## 2011-07-06 DIAGNOSIS — E785 Hyperlipidemia, unspecified: Secondary | ICD-10-CM

## 2011-07-06 DIAGNOSIS — R001 Bradycardia, unspecified: Secondary | ICD-10-CM

## 2011-07-06 DIAGNOSIS — Z720 Tobacco use: Secondary | ICD-10-CM

## 2011-07-06 LAB — BASIC METABOLIC PANEL
CO2: 24 mEq/L (ref 19–32)
Calcium: 9.7 mg/dL (ref 8.4–10.5)
Creatinine, Ser: 1.1 mg/dL (ref 0.4–1.5)
GFR: 78.86 mL/min (ref >60.00)

## 2011-07-06 NOTE — Assessment & Plan Note (Signed)
He is committed to quitting. 

## 2011-07-06 NOTE — Progress Notes (Signed)
[address] [place], [place]  [address] Phone: [phone] Fax:  [phone]  Date:  07/06/2011   Name:  [name]       DOB:  05-21-1961 MRN:  [id number]  PCP:  Dr. Manus Gunning Primary Cardiologist:  Dr. [name]    History of Present Illness: [name] is a 50 y.o. male who presents for post hospital follow up.  He has a history of CAD, status post NSTEMI 8/06 treated with a Taxus DES to the CFX by Dr. Amil Amen, HTN and hyperlipidemia.  He was admitted 11/25-11/28 with an inferior STEMI.  Emergent LHC 06/21/11:  LM 40%, pLAD 30-40%, OM1 stent ok with 50% ISR, mRCA occluded, severe diffuse prox stenosis prior to occlusion site.  PCI:  Overlapping Promus DES x 2 to RCA.   Post PCI, he developed brief atrial fibrillation.  He also had some nonsustained ventricular tachycardia.  Labs: Hemoglobin 12.9, potassium 4.0, creatinine 0.78, ALT 27, TC 132, HDL 35, LDL 77, TG 98.  Chest x-ray demonstrated mild bronchitic changes, no acute findings.  Echocardiogram 06/24/11 EF 60-65%, possible hypokinesis of the basal inferior wall, normal diastolic function.  Overall, he is doing well.  He denies chest pain.  He denies significant shortness of breath.  He denies palpitations, syncope, PND or edema.  He's had a couple of occasions where he has felt short of breath when he laid flat.  However, I do not believe he is describing orthopnea.  He denies weight gain or swelling.  He denies fatigue, dizziness or lightheadedness.  Past Medical History  Diagnosis Date  . Coronary artery disease     s/p Taxus DES to CFX 8/06 in setting of NSTEMI;  s/p Inf STEMI 06/21/11:LM 40%, pLAD 30-40%, OM1 stent ok with 50% ISR, mRCA occluded, severe diffuse prox stenosis prior to occlusion site.  PCI:  Overlapping Promus DES x 2 to RCA.   [name] HTN (hypertension)   . HLD (hyperlipidemia)     Current Outpatient Prescriptions  Medication Sig Dispense Refill  . aspirin EC 81 MG EC tablet  Take 1 tablet (81 mg total) by mouth daily.      [name] atorvastatin (LIPITOR) 80 MG tablet Take 1 tablet (80 mg total) by mouth daily.  30 tablet  11  . lisinopril (PRINIVIL,ZESTRIL) 20 MG tablet Take 1 tablet (20 mg total) by mouth daily.  30 tablet  11  . metoprolol tartrate (LOPRESSOR) 50 MG tablet Take 1 tablet (50 mg total) by mouth 2 (two) times daily.  60 tablet  11  . nitroGLYCERIN (NITROSTAT) 0.4 MG SL tablet Place 1 tablet (0.4 mg total) under the tongue every 5 (five) minutes as needed for chest pain.  25 tablet  4  . pantoprazole (PROTONIX) 40 MG tablet Take 1 tablet (40 mg total) by mouth daily at 12 noon.  30 tablet  11  . prasugrel (EFFIENT) 10 MG TABS Take 1 tablet (10 mg total) by mouth daily.  30 tablet  0    Allergies: No Known Allergies  History  Substance Use Topics  . Smoking status: Current Everyday Smoker -- 1.0 packs/day for 20 years    Types: Cigarettes  . Smokeless tobacco: Not on file  . Alcohol Use: No     PHYSICAL EXAM: VS:  BP 104/66  Pulse 52  Ht 5\' 9"  (1.753 m)  Wt 145 lb 1.9 oz (65.826 kg)  BMI 21.43 kg/m2 Well nourished, well developed, in no acute distress HEENT:  normal Neck: no JVD Cardiac:  normal S1, S2; RRR; no murmur Lungs:  clear to auscultation bilaterally, no wheezing, rhonchi or rales Abd: soft, nontender, no hepatomegaly Ext: no edema; RFA site without hematoma or bruit Skin: warm and dry Neuro:  CNs 2-12 intact, no focal abnormalities noted  EKG:   Sinus bradycardia heart rate 48, normal axis, inferior Q waves, T wave inversions in leads 2, 3, aVF, peaked T waves in leads V2 to V6, no significant change when compared to prior tracing  ASSESSMENT AND PLAN:

## 2011-07-06 NOTE — Assessment & Plan Note (Signed)
He is not particularly symptomatic.  However, I will decrease his metoprolol to 50 mg one half tablet b.i.d.

## 2011-07-06 NOTE — Assessment & Plan Note (Addendum)
Doing well post MI.  Continue aspirin and Effient.  We discussed the importance of dual antiplatelet therapy.  Followup with Dr. [name] in 6-8 weeks.  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

## 2011-07-06 NOTE — Patient Instructions (Signed)
Your physician recommends that you schedule a follow-up appointment in: 6-8 WEEKS WITH DR. Excell Seltzer PER Tereso Newcomer, PA-C  Your physician recommends that you return for lab work in: TODAY BMET 401.1  A WORK NOTE HAS BEEN GIVEN TO YOU STATING YOU ARE UNABLE TO RETURN BACK TO WORK UNTIL 07/28/2011

## 2011-07-06 NOTE — Assessment & Plan Note (Addendum)
Controlled.  Check a basic metabolic panel today to followup on renal function and potassium

## 2011-07-06 NOTE — Assessment & Plan Note (Signed)
Managed by PCP.  Goal LDL less than 70.  I have recommended he have followup lipids and LFTs with his PCP in 6-8 weeks.

## 2011-08-21 ENCOUNTER — Ambulatory Visit: Payer: BC Managed Care – PPO | Admitting: Cardiovascular Disease

## 2011-09-10 ENCOUNTER — Ambulatory Visit (INDEPENDENT_AMBULATORY_CARE_PROVIDER_SITE_OTHER): Payer: BC Managed Care – PPO | Admitting: Cardiovascular Disease

## 2011-09-10 ENCOUNTER — Encounter: Payer: Self-pay | Admitting: Cardiovascular Disease

## 2011-09-10 DIAGNOSIS — I2510 Atherosclerotic heart disease of native coronary artery without angina pectoris: Secondary | ICD-10-CM

## 2011-09-10 DIAGNOSIS — I10 Essential (primary) hypertension: Secondary | ICD-10-CM

## 2011-09-10 NOTE — Patient Instructions (Addendum)
Your physician wants you to follow-up in: 6 MONTHS.  You will receive a reminder letter in the mail two months in advance. If you don't receive a letter, please call our office to schedule the follow-up appointment.  Your physician recommends that you continue on your current medications as directed. Please refer to the Current Medication list given to you today.  

## 2011-09-10 NOTE — Assessment & Plan Note (Signed)
Blood pressure well controlled. Actually BP is a little low today but he is having no symptoms. Will continue same meds.

## 2011-09-10 NOTE — Progress Notes (Signed)
HPI:  This is a 51 year old gentleman presented for follow up evaluation. The patient has a long history of coronary artery disease, undergoing drug alluding stent implantation in 2006 after presenting with a non-ST elevation infarction. He had an inferior wall MI and 2012 and was treated with overlapping drug-eluting stents in the right coronary artery. He presents today for followup evaluation.  The patient denies chest pain or pressure. He denies dyspnea. Unfortunately, he has resumed smoking at nearly 1 pack per day. He denies lightheadedness, weakness, or syncope. He has no other complaints. He has been compliant with his medications.  Outpatient Encounter Prescriptions as of 09/10/2011  Medication Sig Dispense Refill  . aspirin EC 81 MG EC tablet Take 1 tablet (81 mg total) by mouth daily.      [name] atorvastatin (LIPITOR) 80 MG tablet Take 1 tablet (80 mg total) by mouth daily.  30 tablet  11  . lisinopril (PRINIVIL,ZESTRIL) 20 MG tablet Take 1 tablet (20 mg total) by mouth daily.  30 tablet  11  . metoprolol (LOPRESSOR) 50 MG tablet Take 0.5 tablets (25 mg total) by mouth 2 (two) times daily.      . nitroGLYCERIN (NITROSTAT) 0.4 MG SL tablet Place 1 tablet (0.4 mg total) under the tongue every 5 (five) minutes as needed for chest pain.  25 tablet  4  . prasugrel (EFFIENT) 10 MG TABS Take 1 tablet (10 mg total) by mouth daily.  30 tablet  0  . DISCONTD: pantoprazole (PROTONIX) 40 MG tablet Take 1 tablet (40 mg total) by mouth daily at 12 noon.  30 tablet  11    No Known Allergies  Past Medical History  Diagnosis Date  . Coronary artery disease     s/p Taxus DES to CFX 8/06 in setting of NSTEMI;  s/p Inf STEMI 06/21/11:LM 40%, pLAD 30-40%, OM1 stent ok with 50% ISR, mRCA occluded, severe diffuse prox stenosis prior to occlusion site.  PCI:  Overlapping Promus DES x 2 to RCA.   [name] HTN (hypertension)   . HLD (hyperlipidemia)     ROS: Negative except as per HPI  BP 98/60  Pulse 60  Ht 5\' 9"   (1.753 m)  Wt 67.132 kg (148 lb)  BMI 21.86 kg/m2  PHYSICAL EXAM: Pt is alert and oriented, NAD HEENT: normal Neck: JVP - normal, carotids 2+= without bruits Lungs: CTA bilaterally CV: RRR without murmur or gallop Abd: soft, NT, Positive BS, no hepatomegaly Ext: no C/C/E, distal pulses intact and equal Skin: warm/dry no rash  EKG:  Sinus rhythm 68 beats per minute, within normal limits.  ASSESSMENT AND PLAN:

## 2011-09-10 NOTE — Assessment & Plan Note (Signed)
The patient is stable without angina. He has preserved left ventricular function following his myocardial infarction a few months ago. He will continue on a combination of aspirin, effient, beta blocker, ACE inhibitor, and statin drug. I encouraged him to focus on tobacco cessation again. We discussed this at length.

## 2012-05-16 ENCOUNTER — Encounter: Payer: Self-pay | Admitting: Cardiovascular Disease

## 2012-07-22 ENCOUNTER — Other Ambulatory Visit (HOSPITAL_COMMUNITY): Payer: Self-pay | Admitting: Physician Assistant

## 2012-09-11 ENCOUNTER — Other Ambulatory Visit (HOSPITAL_COMMUNITY): Payer: Self-pay | Admitting: Physician Assistant

## 2012-10-07 ENCOUNTER — Other Ambulatory Visit (HOSPITAL_COMMUNITY): Payer: Self-pay | Admitting: Cardiovascular Disease

## 2012-10-24 ENCOUNTER — Encounter: Payer: Self-pay | Admitting: Cardiovascular Disease

## 2012-10-24 ENCOUNTER — Ambulatory Visit (INDEPENDENT_AMBULATORY_CARE_PROVIDER_SITE_OTHER): Payer: BC Managed Care – PPO | Admitting: Cardiovascular Disease

## 2012-10-24 VITALS — BP 90/62 | HR 87 | Ht 69.0 in | Wt 144.0 lb

## 2012-10-24 DIAGNOSIS — I2510 Atherosclerotic heart disease of native coronary artery without angina pectoris: Secondary | ICD-10-CM

## 2012-10-24 DIAGNOSIS — E785 Hyperlipidemia, unspecified: Secondary | ICD-10-CM

## 2012-10-24 DIAGNOSIS — I10 Essential (primary) hypertension: Secondary | ICD-10-CM

## 2012-10-24 MED ORDER — CLOPIDOGREL BISULFATE 75 MG PO TABS: 75.0000 mg | ORAL_TABLET | Freq: Every day | ORAL | Status: DC

## 2012-10-24 MED ORDER — LISINOPRIL 10 MG PO TABS: ORAL_TABLET | ORAL | Status: DC

## 2012-10-24 MED ORDER — METOPROLOL TARTRATE 25 MG PO TABS: 12.5000 mg | ORAL_TABLET | Freq: Two times a day (BID) | ORAL | Status: DC

## 2012-10-24 NOTE — Progress Notes (Signed)
HPI:  52 year old gentleman presenting for followup. He has coronary artery disease with history of drug-eluting stents in 2006. He had an inferior wall MI in 2012 and was treated with overlapping drug-eluting stents in the right coronary artery.  He is doing well. He denies chest pain, chest pressure, dyspnea, edema, palpitations, orthopnea, or PND. He denies bleeding problems. He continues to smoke cigarettes about one pack per day. No other complaints today.  Outpatient Encounter Prescriptions as of 10/24/2012  Medication Sig Dispense Refill  . atorvastatin (LIPITOR) 80 MG tablet TAKE ONE TABLET BY MOUTH EVERY DAY  30 tablet  10  . EFFIENT 10 MG TABS TAKE ONE TABLET BY MOUTH ONCE DAILY  30 tablet  6  . lisinopril (PRINIVIL,ZESTRIL) 20 MG tablet TAKE ONE TABLET BY MOUTH EVERY DAY  30 tablet  10  . metoprolol (LOPRESSOR) 50 MG tablet Take 25 mg by mouth 2 (two) times daily.      . prasugrel (EFFIENT) 10 MG TABS Take 1 tablet (10 mg total) by mouth daily.  30 tablet  0  . nitroGLYCERIN (NITROSTAT) 0.4 MG SL tablet Place 1 tablet (0.4 mg total) under the tongue every 5 (five) minutes as needed for chest pain.  25 tablet  4  . [DISCONTINUED] metoprolol (LOPRESSOR) 50 MG tablet Take 0.5 tablets (25 mg total) by mouth 2 (two) times daily.       No facility-administered encounter medications on file as of 10/24/2012.    No Known Allergies  Past Medical History  Diagnosis Date  . Coronary artery disease     s/p Taxus DES to CFX 8/06 in setting of NSTEMI;  s/p Inf STEMI 06/21/11:LM 40%, pLAD 30-40%, OM1 stent ok with 50% ISR, mRCA occluded, severe diffuse prox stenosis prior to occlusion site.  PCI:  Overlapping Promus DES x 2 to RCA.   [name] HTN (hypertension)   . HLD (hyperlipidemia)     ROS: Negative except as per HPI  BP 90/62  Pulse 87  Ht 5\' 9"  (1.753 m)  Wt 144 lb (65.318 kg)  BMI 21.26 kg/m2  SpO2 96%  PHYSICAL EXAM: Pt is alert and oriented, NAD HEENT: normal Neck: JVP -  normal, carotids 2+= without bruits Lungs: CTA bilaterally CV: RRR without murmur or gallop Abd: soft, NT, Positive BS, no hepatomegaly Ext: no C/C/E, distal pulses intact and equal Skin: warm/dry no rash  EKG:  Normal sinus rhythm, 83 beats per minute, within normal limits.  ASSESSMENT AND PLAN:  1. Coronary artery disease, native vessel. He is greater than 12 months out from his most recent infarction. He was treated with overlapping drug-eluting stents in the setting of acute myocardial infarction I think he would be beneficial for him to complete at least 2 years dual antiplatelet therapy. However, I think the best risk/benefit is to change it effient and Plavix 75 mg daily. He should remain on aspirin 81 mg daily. He's been off of his beta blocker. I am going to ask him to resume metoprolol at a dose of 12.5 mg twice daily. We are going to reduce his lisinopril to 10 mg daily in order to allow his blood pressure to tolerate this.  2. Hyperlipidemia. Lipids are followed by his primary care physician. He is taking atorvastatin 80 mg daily.  3. Tobacco abuse. He needs to stop smoking. He understands this. He was counseled today.  For followup of like to see him back in one year.  Medicine changes as detailed above:  Stop effient  Start Plavix 75 mg daily  Start metoprolol 12.5 mg twice daily  Decrease lisinopril to 10 mg daily  [name] 10/24/2012 4:11 PM

## 2012-10-24 NOTE — Patient Instructions (Addendum)
Your physician has recommended you make the following change in your medication: STOP Effient, START Plavix 75mg  take one by mouth daily, DECREASE Lisinopril to 10mg  take one by mouth daily, START Metoprolol Tartrate 25mg  take one-half tablet by mouth twice a day  Your physician wants you to follow-up in: 1 YEAR with Dr Excell Seltzer.  You will receive a reminder letter in the mail two months in advance. If you don't receive a letter, please call our office to schedule the follow-up appointment.

## 2013-08-16 ENCOUNTER — Other Ambulatory Visit (HOSPITAL_COMMUNITY): Payer: Self-pay | Admitting: Cardiovascular Disease

## 2013-08-18 ENCOUNTER — Other Ambulatory Visit: Payer: Self-pay

## 2013-08-18 DIAGNOSIS — E785 Hyperlipidemia, unspecified: Secondary | ICD-10-CM

## 2013-08-18 DIAGNOSIS — I2510 Atherosclerotic heart disease of native coronary artery without angina pectoris: Secondary | ICD-10-CM

## 2013-08-18 DIAGNOSIS — I10 Essential (primary) hypertension: Secondary | ICD-10-CM

## 2013-08-18 MED ORDER — LISINOPRIL 10 MG PO TABS: ORAL_TABLET | ORAL | Status: DC

## 2013-10-25 ENCOUNTER — Other Ambulatory Visit (HOSPITAL_COMMUNITY): Payer: Self-pay | Admitting: Cardiovascular Disease

## 2013-11-21 ENCOUNTER — Ambulatory Visit (INDEPENDENT_AMBULATORY_CARE_PROVIDER_SITE_OTHER): Payer: BC Managed Care – PPO | Admitting: Cardiovascular Disease

## 2013-11-21 ENCOUNTER — Encounter: Payer: Self-pay | Admitting: Cardiovascular Disease

## 2013-11-21 VITALS — BP 120/82 | HR 72 | Ht 69.0 in | Wt 150.8 lb

## 2013-11-21 DIAGNOSIS — R079 Chest pain, unspecified: Secondary | ICD-10-CM

## 2013-11-21 DIAGNOSIS — E785 Hyperlipidemia, unspecified: Secondary | ICD-10-CM

## 2013-11-21 DIAGNOSIS — I2510 Atherosclerotic heart disease of native coronary artery without angina pectoris: Secondary | ICD-10-CM

## 2013-11-21 DIAGNOSIS — I10 Essential (primary) hypertension: Secondary | ICD-10-CM

## 2013-11-21 NOTE — Progress Notes (Signed)
HPI:  53 year old gentleman presenting for followup. He has coronary artery disease with history of drug-eluting stents in 2006. He had an inferior wall MI in 2012 and was treated with overlapping drug-eluting stents in the right coronary artery. Left ventricular function at the time of his MI was normal with an LVEF estimated at 65% by echocardiography.  Overall the patient is doing well. He has no chest pain, chest pressure, or shortness of breath with normal activities. He describes 2 episodes in the past year when he did experience chest discomfort. The first episode occurred when he ran into a group of black snakes. The second episode occurred with shoveling snow. He felt a tight discomfort in the left chest on both occasions. Symptoms resolved with rest. He has no other complaints. He denies edema, palpitations, bleeding problems, orthopnea, or PND. He is compliant with his medications.   Outpatient Encounter Prescriptions as of 11/21/2013  Medication Sig  . aspirin 81 MG tablet Take 81 mg by mouth daily.  . clopidogrel (PLAVIX) 75 MG tablet TAKE ONE TABLET BY MOUTH ONCE DAILY  . metoprolol (LOPRESSOR) 25 MG tablet Take 0.5 tablets (12.5 mg total) by mouth 2 (two) times daily.  . [DISCONTINUED] atorvastatin (LIPITOR) 80 MG tablet TAKE ONE TABLET BY MOUTH ONCE DAILY  . [DISCONTINUED] lisinopril (PRINIVIL,ZESTRIL) 10 MG tablet TAKE ONE TABLET BY MOUTH EVERY DAY  . [DISCONTINUED] nitroGLYCERIN (NITROSTAT) 0.4 MG SL tablet Place 1 tablet (0.4 mg total) under the tongue every 5 (five) minutes as needed for chest pain.    No Known Allergies  Past Medical History  Diagnosis Date  . Coronary artery disease     s/p Taxus DES to CFX 8/06 in setting of NSTEMI;  s/p Inf STEMI 06/21/11:LM 40%, pLAD 30-40%, OM1 stent ok with 50% ISR, mRCA occluded, severe diffuse prox stenosis prior to occlusion site.  PCI:  Overlapping Promus DES x 2 to RCA.   [name] HTN (hypertension)   . HLD (hyperlipidemia)      ROS: Negative except as per HPI  BP 120/82  Pulse 72  Ht $R'5\' 9"'Ku$  (1.753 m)  PHYSICAL EXAM: Pt is alert and oriented, NAD HEENT: normal Neck: JVP - normal, carotids 2+= without bruits Lungs: CTA bilaterally CV: RRR without murmur or gallop Abd: soft, NT, Positive BS, no hepatomegaly Ext: no C/C/E, distal pulses intact and equal Skin: warm/dry no rash  EKG:  NSR 67 beats per minute, within normal limits.  Cardiac catheterization 06/21/2011: PROCEDURAL FINDINGS  Hemodynamics:  AO 123/70  Coronary angiography:  Coronary dominance: right  Left mainstem: 40% stenosis  Left anterior descending (LAD): Patent to the LV apex. Mild nonobstructive disease noted. Prox stenosis 30-40%. 2 moderate diags are patent.  Left circumflex (LCx): Nonobstructive disease. Patent stent into OM1 with 50% ISR.  Right coronary artery (RCA): Total occlusion in mid vessel. Severe diffuse proximal stenosis prior to occlusion site.  PCI Procedure Note: Following the diagnostic procedure, the decision was made to proceed with PCI. Weight-based bivalirudin was given for anticoagulation. Effient 60 mg was given orally. Once a therapeutic ACT was achieved, a 6 [place] JR4 guide catheter was inserted. A Cougar coronary guidewire was used to cross the lesion. The lesion was predilated with a 2.5x15 mm balloon. The lesion was then stented with overlapping 3.0x38 and 3.0x20 Promus Element DES. The stents were postdilated with a 3.25x20 mm [address] balloon to 18 atm throughout. Following PCI, there was 0% residual stenosis and TIMI-3 flow. Final angiography confirmed an excellent  result. Femoral hemostasis will be achieved with manual pressure once angiomax is off for 2 hours. The patient tolerated the PCI procedure well. There were no immediate procedural complications. The patient was transferred to the CCU for further monitoring.  Final Conclusions:  1. Acute inferior MI secondary to total occlusion of the RCA  2. Successful  primary PCI of the RCA with overlapping DES  3. Moderate Lcx in-stent restenosis  4. Mild nonobstructive LAD stenosis  Recommendations: Check 2D echo tomorrow to assess LV function, post MI medical therapy. Note the patient went into atrial fib with mildly increased HR's after reperfusion (HR 80-110 bpm). He will be started on a beta blocker and hopefully will convert back to sinus rapidly.  [name]  06/21/2011, 8:28 PM  2-D echocardiogram 06/24/2011: Study Conclusions  Left ventricle: The cavity size was normal. Wall thickness was normal. Systolic function was normal. The estimated ejection fraction was in the range of 60% to 65%. Probable hypokinesis of the basalinferior myocardium. Left ventricular diastolic function parameters were normal.  ASSESSMENT AND PLAN: 1. Coronary artery disease, native vessel, with history of MI. The patient will remain on dual antiplatelet therapy with aspirin and Plavix. With his episodes of chest pain associated with high stress and/or vigorous exertion (shoveling snow), recommend an exercise Myoview stress test to rule out ischemia. He has CCS class 1-2 symptoms.  2. Hypertension. Blood pressure under good control on just a very low dose of metoprolol tartrate. He discontinue lisinopril over the past year and feels much better off of this medication. His lightheadedness and fatigue have improved.  3. Hyperlipidemia. He remains on high-dose atorvastatin and he is followed by his primary physician.  4. Tobacco abuse. Lengthy discussion about tobacco cessation. Offered nicotine patches or Chantix. He declined and will work on quitting on his own.  [name] 11/21/2013 4:02 PM

## 2013-11-21 NOTE — Patient Instructions (Signed)
Your physician has requested that you have an exercise stress myoview. For further information please visit [email or web address]. Please follow instruction sheet, as given.  Your physician recommends that you continue on your current medications as directed. Please refer to the Current Medication list given to you today.  Your physician wants you to follow-up in: 1 YEAR with Dr Burt Knack.  You will receive a reminder letter in the mail two months in advance. If you don't receive a letter, please call our office to schedule the follow-up appointment.

## 2013-12-05 ENCOUNTER — Encounter (HOSPITAL_COMMUNITY): Payer: BC Managed Care – PPO

## 2013-12-06 ENCOUNTER — Other Ambulatory Visit (HOSPITAL_COMMUNITY): Payer: Self-pay | Admitting: Cardiovascular Disease

## 2013-12-19 ENCOUNTER — Encounter (HOSPITAL_COMMUNITY): Payer: BC Managed Care – PPO

## 2014-01-22 ENCOUNTER — Other Ambulatory Visit: Payer: Self-pay | Admitting: Cardiovascular Disease

## 2014-04-23 ENCOUNTER — Encounter: Payer: Self-pay | Admitting: Cardiovascular Disease

## 2014-06-19 ENCOUNTER — Telehealth: Payer: Self-pay | Admitting: Cardiovascular Disease

## 2014-06-19 MED ORDER — NITROGLYCERIN 0.4 MG SL SUBL: 0.4000 mg | SUBLINGUAL_TABLET | SUBLINGUAL | Status: DC | PRN

## 2014-06-19 NOTE — Telephone Encounter (Signed)
I spoke with the patient on the telephone. He has been having progressive chest pain at night. 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 However, he has had chest pain the last several nights that feels similar in characteristic to his previous angina. He had had 2 episodes of angina when I saw him in the spring, but his pattern is clearly accelerating. He has no chest discomfort or shortness of breath at present. After careful review of his chart and discussion of potential approaches, which include stress testing versus invasive evaluation, I think it is best that he undergo cardiac catheterization and possible PCI. I recommended that he come into the short stay tomorrow for outpatient cardiac cath. I am working in the hospital tomorrow and will do his history and physical exam prior to the procedure. The patient's nitroglycerin was updated and he will go to his pharmacy tonight to pick that up. He understands that if any severe chest pain or nitroglycerin unresponsive symptoms occur , he needs to call 911. Otherwise he will come in tomorrow for an elective cardiac catheterization and possible PCI.  [name] 06/19/2014 5:47 PM

## 2014-06-19 NOTE — Telephone Encounter (Signed)
New Msg    Patient calling, states when he goes to lay down his chest starts hurting. Patient isn't having chest pains at the moment, he is just concerned about why this may happen. [phone]

## 2014-06-19 NOTE — Telephone Encounter (Signed)
I spoke with the pt and he is complaining of 9/10 chest pain that occurs at night when he lays down in his bed.  Today the pt has exerted himself at work without any symptoms.  The pt states that around 9:30 PM he lays down on the couch and then at 11 he gets up and takes about 20 steps on level ground to his bed.  When he lays down he develops 9/10 chest pain that lasts about 5 minutes and then eases off.  The pt has not taken NTG for his symptoms. The pt's pain is similar to what he felt with his heart attacks.  No associated symptoms with chest pain. The pt feels like his symptoms have escalated over the past few weeks. Dr Burt Knack will contact the pt.

## 2014-06-20 ENCOUNTER — Encounter (HOSPITAL_COMMUNITY): Payer: Self-pay | Admitting: General Practice

## 2014-06-20 ENCOUNTER — Ambulatory Visit (HOSPITAL_COMMUNITY)
Admission: RE | Admit: 2014-06-20 | Discharge: 2014-06-21 | Disposition: A | Discharge status: Home / Self Care | Payer: BC Managed Care – PPO | Source: Ambulatory Visit | Attending: Cardiology | Admitting: Cardiology

## 2014-06-20 ENCOUNTER — Encounter (HOSPITAL_COMMUNITY)
Admission: RE | Disposition: A | Discharge status: Home / Self Care | Payer: BC Managed Care – PPO | Source: Ambulatory Visit | Attending: Cardiology

## 2014-06-20 DIAGNOSIS — F17210 Nicotine dependence, cigarettes, uncomplicated: Secondary | ICD-10-CM | POA: Diagnosis not present

## 2014-06-20 DIAGNOSIS — I10 Essential (primary) hypertension: Secondary | ICD-10-CM | POA: Diagnosis not present

## 2014-06-20 DIAGNOSIS — I200 Unstable angina: Secondary | ICD-10-CM | POA: Diagnosis present

## 2014-06-20 DIAGNOSIS — K219 Gastro-esophageal reflux disease without esophagitis: Secondary | ICD-10-CM | POA: Diagnosis not present

## 2014-06-20 DIAGNOSIS — T82857A Stenosis of cardiac prosthetic devices, implants and grafts, initial encounter: Secondary | ICD-10-CM | POA: Insufficient documentation

## 2014-06-20 DIAGNOSIS — E785 Hyperlipidemia, unspecified: Secondary | ICD-10-CM | POA: Diagnosis not present

## 2014-06-20 DIAGNOSIS — Z7982 Long term (current) use of aspirin: Secondary | ICD-10-CM | POA: Diagnosis not present

## 2014-06-20 DIAGNOSIS — I25110 Atherosclerotic heart disease of native coronary artery with unstable angina pectoris: Secondary | ICD-10-CM | POA: Insufficient documentation

## 2014-06-20 DIAGNOSIS — Z720 Tobacco use: Secondary | ICD-10-CM | POA: Diagnosis present

## 2014-06-20 DIAGNOSIS — I2510 Atherosclerotic heart disease of native coronary artery without angina pectoris: Secondary | ICD-10-CM | POA: Diagnosis present

## 2014-06-20 DIAGNOSIS — Z716 Tobacco abuse counseling: Secondary | ICD-10-CM | POA: Diagnosis not present

## 2014-06-20 DIAGNOSIS — Z955 Presence of coronary angioplasty implant and graft: Secondary | ICD-10-CM | POA: Diagnosis not present

## 2014-06-20 DIAGNOSIS — I252 Old myocardial infarction: Secondary | ICD-10-CM | POA: Diagnosis not present

## 2014-06-20 DIAGNOSIS — M109 Gout, unspecified: Secondary | ICD-10-CM | POA: Insufficient documentation

## 2014-06-20 DIAGNOSIS — Y838 Other surgical procedures as the cause of abnormal reaction of the patient, or of later complication, without mention of misadventure at the time of the procedure: Secondary | ICD-10-CM | POA: Insufficient documentation

## 2014-06-20 DIAGNOSIS — R9431 Abnormal electrocardiogram [ECG] [EKG]: Secondary | ICD-10-CM | POA: Insufficient documentation

## 2014-06-20 DIAGNOSIS — Y92238 Other place in hospital as the place of occurrence of the external cause: Secondary | ICD-10-CM | POA: Diagnosis not present

## 2014-06-20 DIAGNOSIS — I2582 Chronic total occlusion of coronary artery: Secondary | ICD-10-CM | POA: Insufficient documentation

## 2014-06-20 HISTORY — DX: Personal history of other diseases of the musculoskeletal system and connective tissue: Z87.39

## 2014-06-20 HISTORY — PX: LEFT HEART CATHETERIZATION WITH CORONARY ANGIOGRAM: SHX5451

## 2014-06-20 HISTORY — DX: Gastro-esophageal reflux disease without esophagitis: K21.9

## 2014-06-20 HISTORY — DX: Acute myocardial infarction, unspecified: I21.9

## 2014-06-20 LAB — CBC
HCT: 43.1 % (ref 39.0–52.0)
Hemoglobin: 14.7 g/dL (ref 13.0–17.0)
MCH: 31.6 pg (ref 26.0–34.0)
MCHC: 34.1 g/dL (ref 30.0–36.0)
MCV: 92.7 fL (ref 78.0–100.0)
Platelets: 285 10*3/uL (ref 150–400)
RBC: 4.65 MIL/uL (ref 4.22–5.81)
RDW: 12.4 % (ref 11.5–15.5)
WBC: 11.1 10*3/uL — ABNORMAL HIGH (ref 4.0–10.5)

## 2014-06-20 LAB — BASIC METABOLIC PANEL WITH GFR
Anion gap: 15 (ref 5–15)
BUN: 17 mg/dL (ref 6–23)
CO2: 20 meq/L (ref 19–32)
Calcium: 9.4 mg/dL (ref 8.4–10.5)
Chloride: 104 meq/L (ref 96–112)
Creatinine, Ser: 0.77 mg/dL (ref 0.50–1.35)
GFR calc Af Amer: >90 mL/min
GFR calc non Af Amer: >90 mL/min
Glucose, Bld: 102 mg/dL — ABNORMAL HIGH (ref 70–99)
Potassium: 3.9 meq/L (ref 3.7–5.3)
Sodium: 139 meq/L (ref 137–147)

## 2014-06-20 LAB — PROTIME-INR
INR: 1 (ref 0.00–1.49)
Prothrombin Time: 13.3 s (ref 11.6–15.2)

## 2014-06-20 LAB — POCT ACTIVATED CLOTTING TIME
ACTIVATED CLOTTING TIME: 467 s
ACTIVATED CLOTTING TIME: 467 s

## 2014-06-20 SURGERY — LEFT HEART CATHETERIZATION WITH CORONARY ANGIOGRAM
Anesthesia: LOCAL

## 2014-06-20 MED ORDER — MIDAZOLAM HCL 2 MG/2ML IJ SOLN
INTRAMUSCULAR | Status: AC
  Filled 2014-06-20: qty 2

## 2014-06-20 MED ORDER — ATORVASTATIN CALCIUM 80 MG PO TABS
80.0000 mg | ORAL_TABLET | Freq: Every day | ORAL | Status: DC
  Administered 2014-06-20: 18:00:00 80 mg via ORAL
  Filled 2014-06-20 (×2): qty 1

## 2014-06-20 MED ORDER — SODIUM CHLORIDE 0.9 % IJ SOLN: 3.0000 mL | INTRAMUSCULAR | Status: DC | PRN

## 2014-06-20 MED ORDER — SODIUM CHLORIDE 0.9 % IV SOLN: 250.0000 mL | INTRAVENOUS | Status: DC | PRN

## 2014-06-20 MED ORDER — ASPIRIN 81 MG PO TABS: 81.0000 mg | ORAL_TABLET | Freq: Every day | ORAL | Status: DC

## 2014-06-20 MED ORDER — NITROGLYCERIN 1 MG/10 ML FOR IR/CATH LAB
INTRA_ARTERIAL | Status: AC
  Filled 2014-06-20: qty 10

## 2014-06-20 MED ORDER — SODIUM CHLORIDE 0.9 % IJ SOLN: 3.0000 mL | Freq: Two times a day (BID) | INTRAMUSCULAR | Status: DC

## 2014-06-20 MED ORDER — OXYCODONE-ACETAMINOPHEN 5-325 MG PO TABS: 1.0000 | ORAL_TABLET | ORAL | Status: DC | PRN

## 2014-06-20 MED ORDER — ASPIRIN 81 MG PO CHEW
81.0000 mg | CHEWABLE_TABLET | Freq: Every day | ORAL | Status: DC
  Administered 2014-06-21: 81 mg via ORAL
  Filled 2014-06-20 (×2): qty 1

## 2014-06-20 MED ORDER — CLOPIDOGREL BISULFATE 300 MG PO TABS
ORAL_TABLET | ORAL | Status: AC
  Filled 2014-06-20: qty 1

## 2014-06-20 MED ORDER — LIDOCAINE HCL (PF) 1 % IJ SOLN
INTRAMUSCULAR | Status: AC
  Filled 2014-06-20: qty 30

## 2014-06-20 MED ORDER — PNEUMOCOCCAL VAC POLYVALENT 25 MCG/0.5ML IJ INJ
0.5000 mL | INJECTION | INTRAMUSCULAR | Status: AC
  Administered 2014-06-21: 09:00:00 0.5 mL via INTRAMUSCULAR
  Filled 2014-06-20: qty 0.5

## 2014-06-20 MED ORDER — FENTANYL CITRATE 0.05 MG/ML IJ SOLN
INTRAMUSCULAR | Status: AC
  Filled 2014-06-20: qty 2

## 2014-06-20 MED ORDER — METOPROLOL TARTRATE 12.5 MG HALF TABLET
12.5000 mg | ORAL_TABLET | Freq: Two times a day (BID) | ORAL | Status: DC
  Administered 2014-06-20 – 2014-06-21 (×2): 12.5 mg via ORAL
  Filled 2014-06-20 (×4): qty 1

## 2014-06-20 MED ORDER — SODIUM CHLORIDE 0.9 % IV SOLN
INTRAVENOUS | Status: DC
  Administered 2014-06-20: 09:00:00 via INTRAVENOUS

## 2014-06-20 MED ORDER — HEPARIN SODIUM (PORCINE) 1000 UNIT/ML IJ SOLN
INTRAMUSCULAR | Status: AC
  Filled 2014-06-20: qty 1

## 2014-06-20 MED ORDER — ASPIRIN 81 MG PO CHEW
CHEWABLE_TABLET | ORAL | Status: AC
  Administered 2014-06-20: 81 mg via ORAL
  Filled 2014-06-20: qty 1

## 2014-06-20 MED ORDER — HEPARIN (PORCINE) IN NACL 2-0.9 UNIT/ML-% IJ SOLN
INTRAMUSCULAR | Status: AC
  Filled 2014-06-20: qty 1000

## 2014-06-20 MED ORDER — BIVALIRUDIN 250 MG IV SOLR
INTRAVENOUS | Status: AC
  Filled 2014-06-20: qty 250

## 2014-06-20 MED ORDER — VERAPAMIL HCL 2.5 MG/ML IV SOLN
INTRAVENOUS | Status: AC
  Filled 2014-06-20: qty 2

## 2014-06-20 MED ORDER — SODIUM CHLORIDE 0.9 % IV SOLN: 1.0000 mL/kg/h | INTRAVENOUS | Status: AC

## 2014-06-20 MED ORDER — ASPIRIN 81 MG PO CHEW
81.0000 mg | CHEWABLE_TABLET | ORAL | Status: AC
  Administered 2014-06-20: 81 mg via ORAL

## 2014-06-20 MED ORDER — TRAZODONE HCL 50 MG PO TABS
50.0000 mg | ORAL_TABLET | Freq: Once | ORAL | Status: AC
  Administered 2014-06-20: 21:00:00 50 mg via ORAL
  Filled 2014-06-20: qty 1

## 2014-06-20 MED ORDER — ACETAMINOPHEN 325 MG PO TABS: 650.0000 mg | ORAL_TABLET | ORAL | Status: DC | PRN

## 2014-06-20 MED ORDER — ONDANSETRON HCL 4 MG/2ML IJ SOLN: 4.0000 mg | Freq: Four times a day (QID) | INTRAMUSCULAR | Status: DC | PRN

## 2014-06-20 MED ORDER — CLOPIDOGREL BISULFATE 75 MG PO TABS
75.0000 mg | ORAL_TABLET | Freq: Every day | ORAL | Status: DC
  Administered 2014-06-21: 09:00:00 75 mg via ORAL
  Filled 2014-06-20: qty 1

## 2014-06-20 NOTE — Progress Notes (Signed)
[id number] Education completed with pt and wife who voiced understanding. Discussed smoking cessation and gave fake cigarette. Gave smoking cessation handouts and encouraged pt to call [phone] as needed. Discussed heart healthy diet. Pt declined CRP 2 due to work schedule. Encouraged walking for ex. Staff can walk with pt later. Graylon Good RN BSN 06/20/2014 2:49 PM

## 2014-06-20 NOTE — Interval H&P Note (Signed)
History and Physical Interval Note:  06/20/2014 9:44 AM  [name]  has presented today for surgery, with the diagnosis of cp, vad  The various methods of treatment have been discussed with the patient and family. After consideration of risks, benefits and other options for treatment, the patient has consented to  Procedure(s): LEFT HEART CATHETERIZATION WITH CORONARY ANGIOGRAM (N/A) as a surgical intervention .  The patient's history has been reviewed, patient examined, no change in status, stable for surgery.  I have reviewed the patient's chart and labs.  Questions were answered to the patient's satisfaction.    Cath Lab Visit (complete for each Cath Lab visit)  Clinical Evaluation Leading to the Procedure:   ACS: No.  Non-ACS:    Anginal Classification: CCS III  Anti-ischemic medical therapy: Minimal Therapy (1 class of medications)  Non-Invasive Test Results: No non-invasive testing performed  Prior CABG: No previous CABG       [name]

## 2014-06-20 NOTE — H&P (Signed)
History and Physical  Patient ID: [name] MRN: [id number], SOB: 1961/05/01 53 y.o. Date of Encounter: 06/20/2014, 9:35 AM  Primary Physician: [name], MD Primary Cardiologist: Burt Knack  Chief Complaint: Chest pain  HPI: 53 y.o. male w/ PMHx significant for CAD/hx inferior MI who presented to [facility] on 06/20/2014 with complaints of Chest pain.  The patient called the office yesterday evening because of chest discomfort. He reported chest discomfort both with exertion and at rest. He has had nocturnal symptoms for the last 3-4 nights. He describes a pressure like sensation in the substernal chest region radiating to the left upper arm. Over the past 6 months, he has had rare episodes with high levels of stress, but these have accelerated over the last 1-2 weeks. He's had a slow down when walking up hills. He was able to work yesterday without chest pain. His symptoms are exactly like those experienced at the time of his heart attack, albeit less severe. He has not taken nitroglycerin because his whole nitroglycerin or expired.  After discussion with the patient yesterday evening, I recommended cardiac catheterization and possible PCI. His symptoms are highly typical of accelerating/crescendo angina. As he was chest pain-free at the time, I advised him to come in first thing this morning for evaluation prior to cardiac catheterization. If he were to have chest pain refractory to nitroglycerin, I recommended that he call EMS. Fortunately, over night he experienced no symptoms. He presents now for evaluation prior to cardiac catheterization and possible PCI.  The patient denies associated symptoms of shortness of breath, diaphoresis, nausea, or vomiting. He's had no prolonged episodes of chest pain. His pain has resolved with rest. He has been compliant with his medications. He has been continued on dual antiplatelet therapy with aspirin and clopidogrel without bleeding  problems.  Past Medical History  Diagnosis Date  . Coronary artery disease     s/p Taxus DES to CFX 8/06 in setting of NSTEMI;  s/p Inf STEMI 06/21/11:LM 40%, pLAD 30-40%, OM1 stent ok with 50% ISR, mRCA occluded, severe diffuse prox stenosis prior to occlusion site.  PCI:  Overlapping Promus DES x 2 to RCA.   [name] HTN (hypertension)   . HLD (hyperlipidemia)      Surgical History: No past surgical history on file.   Home Meds: Prior to Admission medications   Medication Sig Start Date End Date Taking? Authorizing Provider  aspirin 81 MG tablet Take 81 mg by mouth daily.   Yes Historical Provider, MD  atorvastatin (LIPITOR) 80 MG tablet Take 80 mg by mouth daily.   Yes Historical Provider, MD  atorvastatin (LIPITOR) 80 MG tablet TAKE ONE TABLET BY MOUTH ONCE DAILY   Yes [name], MD  atorvastatin (LIPITOR) 80 MG tablet Take 80 mg by mouth daily.   Yes Historical Provider, MD  clopidogrel (PLAVIX) 75 MG tablet Take 75 mg by mouth daily.   Yes Historical Provider, MD  metoprolol tartrate (LOPRESSOR) 25 MG tablet TAKE ONE-HALF TABLET BY MOUTH TWICE DAILY 01/22/14  Yes [name], MD  metoprolol tartrate (LOPRESSOR) 25 MG tablet Take 12.5 mg by mouth 2 (two) times daily.   Yes Historical Provider, MD  nitroGLYCERIN (NITROSTAT) 0.4 MG SL tablet Place 1 tablet (0.4 mg total) under the tongue every 5 (five) minutes as needed for chest pain. 06/19/14  Yes [name], MD  clopidogrel (PLAVIX) 75 MG tablet TAKE ONE TABLET BY MOUTH ONCE DAILY    [name], MD  Allergies: No Known Allergies  History   Social History  . Marital Status: Single    Spouse Name: N/A    Number of Children: N/A  . Years of Education: N/A   Occupational History  . Not on file.   Social History Main Topics  . Smoking status: Current Every Day Smoker -- 1.00 packs/day for 20 years    Types: Cigarettes  . Smokeless tobacco: Not on file  . Alcohol Use: No  . Drug Use: No  . Sexual Activity: Yes     Other Topics Concern  . Not on file   Social History Narrative  . No narrative on file     Family history: No history of premature CAD. The patient's mother has hypertension. His father died of alcohol related illness.  Review of Systems: General: negative for chills, fever, night sweats or weight changes.  ENT: negative for rhinorrhea or epistaxis Cardiovascular: See history present illness. Negative for palpitations or leg swelling. Dermatological: negative for rash Respiratory: negative for cough or wheezing GI: negative for nausea, vomiting, diarrhea, bright red blood per rectum, melena, or hematemesis GU: no hematuria, urgency, or frequency Neurologic: negative for visual changes, syncope, headache, or dizziness Heme: no easy bruising or bleeding Endo: negative for excessive thirst, thyroid disorder, or flushing Musculoskeletal: negative for joint pain or swelling, negative for myalgias All other systems reviewed and are otherwise negative except as noted above.  Physical Exam: Blood pressure 132/85, pulse 66, temperature 97.6 F (36.4 C), temperature source Oral, resp. rate 20, height 5\' 9"  (1.753 m), weight 162 lb (73.483 kg), SpO2 100 %. General: Well developed, well nourished, alert and oriented, in no acute distress. HEENT: Normocephalic, atraumatic, sclera non-icteric, no xanthomas, nares are without discharge.  Neck: Supple. Carotids 2+ without bruits. JVP normal Lungs: Clear bilaterally to auscultation without wheezes, rales, or rhonchi. Breathing is unlabored. Heart: RRR with normal S1 and S2. No murmurs, rubs, or gallops appreciated. Abdomen: Soft, non-tender, non-distended with normoactive bowel sounds. No hepatomegaly. No rebound/guarding. No obvious abdominal masses. Back: No CVA tenderness Msk:  Strength and tone appear normal for age. Extremities: No clubbing, cyanosis, or edema.  Distal pedal pulses are 2+ and equal bilaterally. Neuro: CNII-XII intact,  moves all extremities spontaneously. Psych:  Responds to questions appropriately with a normal affect.   Labs:   Lab Results  Component Value Date   WBC 11.1* 06/20/2014   HGB 14.7 06/20/2014   HCT 43.1 06/20/2014   MCV 92.7 06/20/2014   PLT 285 06/20/2014   No results for input(s): NA, K, CL, CO2, BUN, CREATININE, CALCIUM, PROT, BILITOT, ALKPHOS, ALT, AST, GLUCOSE in the last 168 hours.  Invalid input(s): LABALBU No results for input(s): CKTOTAL, CKMB, TROPONINI in the last 72 hours. Lab Results  Component Value Date   CHOL 132 06/22/2011   HDL 35* 06/22/2011   LDLCALC 77 06/22/2011   TRIG 98 06/22/2011   No results found for: DDIMER  Radiology/Studies:  No results found.   EKG: Normal sinus rhythm with short PR, otherwise within normal limits.  ASSESSMENT AND PLAN:  1. Accelerating angina. The patient has typical symptoms of unstable angina pectoris. Cardiac catheterization is indicated. He is at high risk with his known history of coronary artery disease and previous MI. He continues to smoke cigarettes. Further disposition pending cardiac cath results.  I have reviewed the risks, indications, and alternatives to cardiac catheterization and possible PCI were reviewed with the patient. Risks include but are not limited to bleeding,  infection, vascular injury, stroke, myocardial infection, arrhythmia, kidney injury, radiation-related injury in the case of prolonged fluoroscopy use, emergency cardiac surgery, and death. The patient understands the risks of serious complication is low (<6%).   2. HTN: BP controlled on metoprolol. Has been on lisinopril but unable to tolerate due to fatigue and low BP.  3. Hyperlipidemia: treated with a statin drug. Followed by PCP.  4. Tobacco: cessation counseling again done. Unfortunately he continues to smoke.   Signed, [name] MD 06/20/2014, 9:35 AM

## 2014-06-20 NOTE — Progress Notes (Signed)
TR BAND REMOVAL  LOCATION:    right radial  DEFLATED PER PROTOCOL:    Yes.    TIME BAND OFF / DRESSING APPLIED:    1615   SITE UPON ARRIVAL:    Level 1 (small bruise, soft)  SITE AFTER BAND REMOVAL:    Level 1  REVERSE ALLEN'S TEST:     positive  CIRCULATION SENSATION AND MOVEMENT:    Within Normal Limits   Yes.    COMMENTS:   Tolerated procedure well

## 2014-06-20 NOTE — CV Procedure (Signed)
Cardiac Catheterization Procedure Note  Name: Adam LAVIOLETTE MRN: [id number] DOB: Dec 02, 1960  Procedure: Left Heart Cath, Selective Coronary Angiography, LV angiography, PTCA and stenting of the LCx  Indication: Unstable Angina. 53 year old gentleman with known CAD. He has presented with acute coronary syndrome on 2 previous occasions, 2007 and 2014. He called into the office last night with progressive symptoms of angina. He had a clear pattern of accelerating anginal symptoms and I recommended that he come directly to the short stay this morning for cardiac catheterization and possible PCI.  Procedural Details:  The right wrist was prepped, draped, and anesthetized with 1% lidocaine. Using the modified Seldinger technique, a 5/6 French Slender sheath was introduced into the right radial artery. 3 mg of verapamil was administered through the sheath, weight-based unfractionated heparin was administered intravenously. Standard Judkins catheters were used for selective coronary angiography and left ventriculography. Catheter exchanges were performed over an exchange length guidewire.  PROCEDURAL FINDINGS Hemodynamics: AO 102/59 LV 101/2   Coronary angiography: Coronary dominance: right  Left mainstem: The left main is patent with no high-grade stenosis noted. The vessel divides into the LAD and left circumflex.  Left anterior descending (LAD): The LAD is patent to the left ventricular apex. There is mild calcification noted. The proximal LAD has 20-30% stenosis. The mid LAD is patent with minor luminal irregularities. The distal LAD is patent and it wraps around the left ventricular apex. The first septal perforator is very large and it supplies extensive collaterals to the distal RCA territory. The diagonal branches are patent with 50-60% ostial stenoses.  Left circumflex (LCx): The left circumflex is a large-caliber vessel. The proximal vessel is patent without stenosis. The mid vessel is  previously stented and there is subtotal stenosis of 95% identified. This is within the previously stented segment. The distal stented segment is widely patent. The obtuse marginal branch beyond the stented segment is widely patent.  Right coronary artery (RCA): This vessel is large, dominant, and extensively stented. The vessel is totally occluded in its midportion within the previously stented segment. There are left to right collaterals supplied by septal perforators of the LAD.  Left ventriculography: There is akinesis of the basal inferior wall. The mid inferior wall is hypokinetic. The other LV wall segments contract vigorously. The estimated LVEF is 50-55%. There is no significant mitral regurgitation identified.  PCI Note:  Following the diagnostic procedure, the decision was made to proceed with PCI of the left circumflex.  The basal inferior wall appears akinetic consistent with his old inferior wall MI. I suspect that his symptoms were related to critical stenosis of the left circumflex. The patient was reloaded with Plavix 300 mg. He has been taking chronic Plavix. Weight-based bivalirudin was given for anticoagulation. Once a therapeutic ACT was achieved, a 6 [place] XB LAD guide catheter was inserted.  A cougar guidewire was initially attempted. I could not cross the lesion with this. Multiple benzathine wire tip were attempted. I changed out to a whisper wire with supportive balloon I still was unable to cross the lesion. That wire was left in place and a Fielder XT wire was used successfully across the lesion. The whisper wire was then pulled out. The remainder of the interventional procedure was performed over the [facility] wire.  The lesion was predilated with a 2.5 mm balloon to 12 atm.  The lesion was then stented with a 3.25x23 mm Xience DES at 14 atm.  The stent was postdilated with a 3.5  mm noncompliant balloon to 16 atm.  Following PCI, there was 0% residual stenosis and TIMI-3 flow.  Final angiography confirmed an excellent result. The patient tolerated the procedure well. There were no immediate procedural complications. A TR band was used for radial hemostasis. The patient was transferred to the post catheterization recovery area for further monitoring.  PCI Data: Vessel - LCx/Segment - mid Percent Stenosis (pre)  95 (in-stent) TIMI-flow 3 Stent 3.25x23 mm Xience DES Percent Stenosis (post) 0 TIMI-flow (post) 3  Contrast: 165 cc  Radiation dose/Fluoro time: 15.7 min  Estimated Blood Loss: minimal  Final Conclusions:   1. Severe 2 vessel CAD with total occlusion of the RCA (in-stent) and critical stenosis of the LCx (in-stent) 2. Mild LV segmental dysfunction with low LVEDP 3. Successful single vessel PCI   Recommendations:  ASA/plavix at least 12 months and favor long-term without interruption. Aggressive risk reduction, tobacco cessation. Should be ok for discharge home tomorrow am.  [name] MD, [facility] 06/20/2014, 11:26 AM

## 2014-06-21 ENCOUNTER — Encounter (HOSPITAL_COMMUNITY): Payer: Self-pay | Admitting: Physician Assistant

## 2014-06-21 DIAGNOSIS — I25110 Atherosclerotic heart disease of native coronary artery with unstable angina pectoris: Secondary | ICD-10-CM | POA: Diagnosis not present

## 2014-06-21 DIAGNOSIS — T82857A Stenosis of cardiac prosthetic devices, implants and grafts, initial encounter: Secondary | ICD-10-CM | POA: Diagnosis not present

## 2014-06-21 DIAGNOSIS — I2582 Chronic total occlusion of coronary artery: Secondary | ICD-10-CM | POA: Diagnosis not present

## 2014-06-21 DIAGNOSIS — E785 Hyperlipidemia, unspecified: Secondary | ICD-10-CM

## 2014-06-21 DIAGNOSIS — Z955 Presence of coronary angioplasty implant and graft: Secondary | ICD-10-CM | POA: Diagnosis not present

## 2014-06-21 DIAGNOSIS — I10 Essential (primary) hypertension: Secondary | ICD-10-CM

## 2014-06-21 LAB — BASIC METABOLIC PANEL
ANION GAP: 13 (ref 5–15)
BUN: 14 mg/dL (ref 6–23)
CHLORIDE: 102 meq/L (ref 96–112)
CO2: 21 mEq/L (ref 19–32)
Calcium: 9.3 mg/dL (ref 8.4–10.5)
Creatinine, Ser: 0.94 mg/dL (ref 0.50–1.35)
GFR calc Af Amer: >90 mL/min (ref >90)
Glucose, Bld: 86 mg/dL (ref 70–99)
Potassium: 3.9 mEq/L (ref 3.7–5.3)
Sodium: 136 mEq/L — ABNORMAL LOW (ref 137–147)

## 2014-06-21 LAB — CBC
HCT: 41.4 % (ref 39.0–52.0)
Hemoglobin: 14.3 g/dL (ref 13.0–17.0)
MCH: 32.1 pg (ref 26.0–34.0)
MCHC: 34.5 g/dL (ref 30.0–36.0)
MCV: 93 fL (ref 78.0–100.0)
Platelets: 278 10*3/uL (ref 150–400)
RBC: 4.45 MIL/uL (ref 4.22–5.81)
RDW: 12.3 % (ref 11.5–15.5)
WBC: 12.2 10*3/uL — ABNORMAL HIGH (ref 4.0–10.5)

## 2014-06-21 MED ORDER — NITROGLYCERIN 0.4 MG SL SUBL: 0.4000 mg | SUBLINGUAL_TABLET | SUBLINGUAL | Status: AC | PRN

## 2014-06-21 NOTE — Discharge Summary (Signed)
Discharge Summary   Patient ID: [name], MRN: [id number], DOB/AGE: 52-03-1961 52 y.o.  Admit date: 06/20/2014 Discharge date: 06/21/2014   Primary Care Physician:  Gaynelle Arabian R   Primary Cardiologist:  Dr. [name]   Reason for Admission:  Unstable angina   Primary Discharge Diagnoses:  Principal Problem:   Unstable angina pectoris Active Problems:   Coronary artery disease   Hypertension   Hyperlipidemia   Tobacco abuse     Wt Readings from Last 3 Encounters:  06/21/14 162 lb 14.7 oz (73.9 kg)  11/21/13 150 lb 12 oz (68.38 kg)  10/24/12 144 lb (65.318 kg)    Secondary Discharge Diagnoses:   Past Medical History  Diagnosis Date  . Coronary artery disease     a. s/p Taxus DES to CFX 8/06 in setting of NSTEMI;  b.  s/p Inf STEMI 06/21/11:  mRCA occluded>> PCI:  Overlapping Promus DES x 2 to RCA.;  c.  LHC (11/15):  pLAD 20-30%, oDx branches 50-60%, mCFX stent 95% ISR, dCFX stent ok, mRCA stent occluded with L-R collats, EF 50-55%, basal inf AK, mid Inf HK >> PCI:  3.25 x 23 mm Xience DES to [place]  . HLD (hyperlipidemia)   . HTN (hypertension)     "took me off my BP pills" (06/20/2014)  . Myocardial infarction 02/2005; 05/2011  . GERD (gastroesophageal reflux disease)     hx (06/20/2014)  . History of gout       Allergies:   No Known Allergies    Procedures Performed This Admission:    CARDIAC CATHETERIZATION AND PERCUTANEOUS CORONARY INTERVENTION (06/20/14): Coronary dominance: right  Left mainstem: The left main is patent with no high-grade stenosis noted. The vessel divides into the LAD and left circumflex.  Left anterior descending (LAD): The LAD is patent to the left ventricular apex. There is mild calcification noted. The proximal LAD has 20-30% stenosis. The mid LAD is patent with minor luminal irregularities. The distal LAD is patent and it wraps around the left ventricular apex. The first septal perforator is very large and it supplies  extensive collaterals to the distal RCA territory. The diagonal branches are patent with 50-60% ostial stenoses.  Left circumflex (LCx): The left circumflex is a large-caliber vessel. The proximal vessel is patent without stenosis. The mid vessel is previously stented and there is subtotal stenosis of 95% identified. This is within the previously stented segment. The distal stented segment is widely patent. The obtuse marginal branch beyond the stented segment is widely patent.  Right coronary artery (RCA): This vessel is large, dominant, and extensively stented. The vessel is totally occluded in its midportion within the previously stented segment. There are left to right collaterals supplied by septal perforators of the LAD.  Left ventriculography: There is akinesis of the basal inferior wall. The mid inferior wall is hypokinetic. The other LV wall segments contract vigorously. The estimated LVEF is 50-55%. There is no significant mitral regurgitation identified.  PCI Data: Vessel - LCx/Segment - mid Percent Stenosis (pre)  95 (in-stent) TIMI-flow 3 Stent 3.25x23 mm Xience DES Percent Stenosis (post) 0 TIMI-flow (post) 3   Hospital Course:  [name] is a 53 y.o. male with known hx of CAD s/p NSTEMI in 2006 treated with DES to CFX and inferior STEMI in 2012 treated with DES to the RCA, HTN, HL, tobacco abuse.  He called in to the office 06/19/14 with progressive symptoms of angina.  He was set up for cardiac catheterization yesterday  and this demonstrated severe 2 v CAD with total occlusion of the RCA at previous stent site with L to R collaterals and critical CFX stenosis at the previous stent.  He underwent PCI with DES to the CFX.  EF was 50-55% with inferior segmental wall motion abnormalities.  This AM, he was seen by Dr. Peter Martinique.  He is doing well without further chest pain.  Smoking cessation was again recommended.  He is felt stable for DC to home.  He will need to remain on dual  antiplatelet therapy for a minimum of 12 mos (preferable indefinite).  He will FU in the office in the next 2 weeks.     Discharge Vitals:   Blood pressure 130/77, pulse 63, temperature 98.4 F (36.9 C), temperature source Oral, resp. rate 18, height 5\' 9"  (1.753 m), weight 162 lb 14.7 oz (73.9 kg), SpO2 97 %.   Labs:   Recent Labs  06/20/14 0900 06/21/14 0323  WBC 11.1* 12.2*  HGB 14.7 14.3  HCT 43.1 41.4  MCV 92.7 93.0  PLT 285 278     Recent Labs  06/20/14 0900 06/21/14 0323  NA 139 136*  K 3.9 3.9  CL 104 102  CO2 20 21  BUN 17 14  CREATININE 0.77 0.94  CALCIUM 9.4 9.3     Recent Labs  06/20/14 0900  INR 1.00     Diagnostic Procedures and Studies:  No results found.    Disposition:   Pt is being discharged home today in good condition.  Follow-up Plans & Appointments      Follow-up Information    Follow up with [name], MD In 2 weeks.   Specialty:  Cardiology   Why:  the office will call to arrange an appointment with Dr. Burt Knack or Richardson Dopp, PA-C   Contact information:   [address] [address] [phone]       Discharge Medications    Medication List    TAKE these medications        aspirin 81 MG tablet  Take 81 mg by mouth daily.     atorvastatin 80 MG tablet  Commonly known as:  LIPITOR  Take 80 mg by mouth daily.     clopidogrel 75 MG tablet  Commonly known as:  PLAVIX  Take 75 mg by mouth daily.     metoprolol tartrate 25 MG tablet  Commonly known as:  LOPRESSOR  TAKE ONE-HALF TABLET BY MOUTH TWICE DAILY     nitroGLYCERIN 0.4 MG SL tablet  Commonly known as:  NITROSTAT  Place 1 tablet (0.4 mg total) under the tongue every 5 (five) minutes as needed for chest pain.         Outstanding Labs/Studies  1. None   Duration of Discharge Encounter: Greater than 30 minutes including physician and PA time.  Signed, Richardson Dopp, PA-C   06/21/2014 8:18 AM

## 2014-06-21 NOTE — Discharge Instructions (Signed)
NO HEAVY LIFTING OR SEXUAL ACTIVITY X 7 DAYS. NO DRIVING X 2-3 DAYS. NO SOAKING BATHS, HOT TUBS, POOLS, ETC., X 7 DAYS  Low Sodium, Heart-Healthy Diet  Call [phone] for problems with the cath site (bleeding, bruising, fever).

## 2014-06-21 NOTE — Progress Notes (Signed)
Primary cardiologist:  Dr. [name]   Subjective:    Hx of CAD, tobacco abuse, HTN, HL. Patient with prior MI in 2006 >> DES to CFX and STEMI in 2012 >> DES x 2 to RCA.  Presented with accelerated angina.  LHC with severe 2v CAD (RCA occl - ISR) and critical CFX ISR, EF 50-55% with inf AK/HK >> 3.25x23 mm Xience DES to mid CFX.  This AM, he denies chest pain or dyspnea.  Objective:   Temp:  [97.5 F (36.4 C)-98.6 F (37 C)] 98 F (36.7 C) (11/26 1829) Pulse Rate:  [58-77] 66 (11/26 9371) Resp:  [18-20] 20 (11/26 6967) BP: (90-153)/(64-98) 112/67 mmHg (11/26 0632) SpO2:  [96 %-100 %] 96 % (11/26 8938) Weight:  [162 lb (73.483 kg)-162 lb 14.7 oz (73.9 kg)] 162 lb 14.7 oz (73.9 kg) (11/26 0000) Last BM Date: 06/19/14  Filed Weights   06/20/14 0820 06/21/14 0000  Weight: 162 lb (73.483 kg) 162 lb 14.7 oz (73.9 kg)    Intake/Output Summary (Last 24 hours) at 06/21/14 0635 Last data filed at 06/21/14 0048  Gross per 24 hour  Intake 984.25 ml  Output    675 ml  Net 309.25 ml    Telemetry:  NSR  Exam:  General:  NAD  HEENT:  No JVD  Lungs:  CTA bilat   Cardiac:  RRR  Abdomen:  soft  Extremities:  right wrist without hematoma or mass   Lab Results:  Basic Metabolic Panel:  Recent Labs Lab 06/20/14 0900 06/21/14 0323  NA 139 136*  K 3.9 3.9  CL 104 102  CO2 20 21  GLUCOSE 102* 86  BUN 17 14  CREATININE 0.77 0.94  CALCIUM 9.4 9.3    Liver Function Tests: No results for input(s): AST, ALT, ALKPHOS, BILITOT, PROT, ALBUMIN in the last 168 hours.  CBC:  Recent Labs Lab 06/20/14 0900 06/21/14 0323  WBC 11.1* 12.2*  HGB 14.7 14.3  HCT 43.1 41.4  MCV 92.7 93.0  PLT 285 278    Cardiac Enzymes: No results for input(s): CKTOTAL, CKMB, CKMBINDEX, TROPONINI in the last 168 hours.  BNP: No results for input(s): PROBNP in the last 8760 hours.  Coagulation:  Recent Labs Lab 06/20/14 0900  INR 1.00       Medications:   Scheduled  Medications: . aspirin  81 mg Oral Daily  . atorvastatin  80 mg Oral q1800  . clopidogrel  75 mg Oral Daily  . metoprolol tartrate  12.5 mg Oral BID  . pneumococcal 23 valent vaccine  0.5 mL Intramuscular Tomorrow-1000  . sodium chloride  3 mL Intravenous Q12H     Infusions:     PRN Medications:  sodium chloride, acetaminophen, ondansetron (ZOFRAN) IV, oxyCODONE-acetaminophen, sodium chloride   Assessment:    Principal Problem:   Unstable angina pectoris Active Problems:   Coronary artery disease   Hypertension   Hyperlipidemia   Tobacco abuse     Plan/Discussion:    Doing well post PCI.  Will likely DC this AM after seen by MD.  Continue ASA, Plavix 12 mos minimum - probably indef.  FU with Dr. [name] or me in 2 weeks.  Danton Sewer, PA-C   06/21/2014 6:35 AM Pager [phone]     Patient seen and examined and history reviewed. Agree with above findings and plan. Doing well post PCI. No chest pain. Ecg is unchanged. VSS. No radial site hematoma. Labs OK. Plan to DC today on  ASA and Plavix. Plan to treat RCA occlusion medically. DC today with follow up in 2 weeks.  Lachandra Dettmann [place], [place] 06/21/2014 7:50 AM

## 2014-06-22 MED FILL — Sodium Chloride IV Soln 0.9%: INTRAVENOUS | Qty: 50 | Status: AC

## 2014-06-25 ENCOUNTER — Encounter (HOSPITAL_COMMUNITY): Payer: BC Managed Care – PPO

## 2014-06-28 ENCOUNTER — Encounter: Payer: Self-pay | Admitting: Cardiovascular Disease

## 2014-06-28 ENCOUNTER — Ambulatory Visit (INDEPENDENT_AMBULATORY_CARE_PROVIDER_SITE_OTHER): Payer: BC Managed Care – PPO | Admitting: Cardiovascular Disease

## 2014-06-28 VITALS — BP 122/84 | HR 58 | Ht 69.0 in | Wt 159.1 lb

## 2014-06-28 DIAGNOSIS — I200 Unstable angina: Secondary | ICD-10-CM

## 2014-06-28 NOTE — Progress Notes (Addendum)
Background: The patient is followed for coronary artery disease. He has a history of inferior MI. The patient has had acute coronary syndrome on 2 separate occasions (2007 and 2014) treated with PCI in both settings. He was evaluated in November 2015 with progressive chest pain at rest and with low-level exertion. He underwent cardiac catheterization demonstrating total occlusion of the right coronary artery within the previously stented segment and severe in-stent restenosis of the left circumflex. He was treated with PCI of the left circumflex using a drug-eluting stent. The right coronary artery was collateralized from the left and there was a wall motion abnormality consistent with his old inferior MI. LV function was preserved with an ejection fraction of 50-55%.  HPI:  53 year old gentleman presenting for hospital follow-up evaluation after recent PCI procedure.  The patient is doing well since his procedure. He has no symptoms of chest pain or pressure. He denies dyspnea , edema, or heart palpitations. He feels much better and has no complaints today. He is tolerating his medications and reports good adherence to his medical regimen.  Outpatient Encounter Prescriptions as of 06/28/2014  Medication Sig  . aspirin 81 MG tablet Take 81 mg by mouth daily.  [name] atorvastatin (LIPITOR) 80 MG tablet Take 80 mg by mouth daily.  . clopidogrel (PLAVIX) 75 MG tablet Take 75 mg by mouth daily.  . metoprolol tartrate (LOPRESSOR) 25 MG tablet TAKE ONE-HALF TABLET BY MOUTH TWICE DAILY  . nitroGLYCERIN (NITROSTAT) 0.4 MG SL tablet Place 1 tablet (0.4 mg total) under the tongue every 5 (five) minutes as needed for chest pain.    No Known Allergies  Past Medical History  Diagnosis Date  . Coronary artery disease     a. s/p Taxus DES to CFX 8/06 in setting of NSTEMI;  b.  s/p Inf STEMI 06/21/11:  mRCA occluded>> PCI:  Overlapping Promus DES x 2 to RCA.;  c.  LHC (11/15):  pLAD 20-30%, oDx branches 50-60%, mCFX  stent 95% ISR, dCFX stent ok, mRCA stent occluded with L-R collats, EF 50-55%, basal inf AK, mid Inf HK >> PCI:  3.25 x 23 mm Xience DES to [place]  . HLD (hyperlipidemia)   . HTN (hypertension)     "took me off my BP pills" (06/20/2014)  . Myocardial infarction 02/2005; 05/2011  . GERD (gastroesophageal reflux disease)     hx (06/20/2014)  . History of gout     family history is not on file.    BP 122/84 mmHg  Pulse 58  Ht 5\' 9"  (1.753 m)  Wt 159 lb 1.9 oz (72.176 kg)  BMI 23.49 kg/m2  PHYSICAL EXAM: Pt is alert and oriented, NAD HEENT: normal Neck: JVP - normal Lungs: CTA bilaterally CV: RRR without murmur or gallop Abd: soft, NT, Positive BS, no hepatomegaly Ext: no C/C/E, distal pulses intact and equal , right radial site is clear Skin: warm/dry no rash  ASSESSMENT AND PLAN:  1. Unstable angina pectoris, recent episode. The patient is now status post PCI of the left circumflex and he is symptom-free. He is back to routine  Activities. He is tolerating dual antiplatelet therapy with aspirin and clopidogrel. I stressed the importance of medication adherence. I will see him back in 6 months. He's also tolerating a beta blocker and statin drug.  2. Hyperlipidemia : The patient is treated with atorvastatin. He is followed by his primary physician.   3. Tobacco abuse. I again stressed the importance of complete tobacco cessation. He has cut  back significantly. He understands the implication with respect to his CAD.  [name], MD 06/28/2014 11:00 AM  03.23.2016 ADDENDUM  Spoke with Dr Roxan Hockey yesterday who informed me of patient's lund CA dx and advised the patient will need biopsies, etc. He had PCI in November, now about 4 months out from his last PCI. Considering need for tissue dx and treatment, advised it's ok to proceed and hold plavix 4-5 days before surgery. He will be able to stay on ASA 81 mg. Pt understands plan - we spoke on the telephone today.   [name] 10/17/2014 5:55 PM

## 2014-06-28 NOTE — Patient Instructions (Signed)
Your physician wants you to follow-up in: 6 MONTHS with Dr Cooper.  You will receive a reminder letter in the mail two months in advance. If you don't receive a letter, please call our office to schedule the follow-up appointment.  Your physician recommends that you continue on your current medications as directed. Please refer to the Current Medication list given to you today.  

## 2014-07-04 ENCOUNTER — Encounter (HOSPITAL_COMMUNITY): Payer: Self-pay | Admitting: Cardiovascular Disease

## 2014-07-05 ENCOUNTER — Encounter (HOSPITAL_COMMUNITY): Payer: Self-pay | Admitting: Cardiology

## 2014-07-16 ENCOUNTER — Other Ambulatory Visit: Payer: Self-pay | Admitting: Cardiovascular Disease

## 2014-08-05 ENCOUNTER — Other Ambulatory Visit: Payer: Self-pay | Admitting: Cardiovascular Disease

## 2014-08-27 DIAGNOSIS — R918 Other nonspecific abnormal finding of lung field: Secondary | ICD-10-CM

## 2014-08-27 HISTORY — DX: Other nonspecific abnormal finding of lung field: R91.8

## 2014-09-21 ENCOUNTER — Encounter (HOSPITAL_COMMUNITY): Payer: Self-pay | Admitting: Vascular Surgery

## 2014-09-21 ENCOUNTER — Emergency Department (HOSPITAL_COMMUNITY): Payer: 59

## 2014-09-21 ENCOUNTER — Other Ambulatory Visit: Payer: Self-pay

## 2014-09-21 ENCOUNTER — Inpatient Hospital Stay (HOSPITAL_COMMUNITY)
Admission: EM | Admit: 2014-09-21 | Discharge: 2014-09-24 | DRG: 176 | Disposition: A | Discharge status: Home / Self Care | Payer: 59 | Attending: Internal Medicine | Admitting: Internal Medicine

## 2014-09-21 ENCOUNTER — Other Ambulatory Visit (HOSPITAL_COMMUNITY): Payer: Self-pay | Admitting: Family Medicine

## 2014-09-21 ENCOUNTER — Ambulatory Visit (INDEPENDENT_AMBULATORY_CARE_PROVIDER_SITE_OTHER)
Admission: RE | Admit: 2014-09-21 | Discharge: 2014-09-21 | Disposition: A | Discharge status: Home / Self Care | Payer: 59 | Source: Ambulatory Visit | Attending: Vascular Surgery | Admitting: Vascular Surgery

## 2014-09-21 DIAGNOSIS — E785 Hyperlipidemia, unspecified: Secondary | ICD-10-CM | POA: Diagnosis present

## 2014-09-21 DIAGNOSIS — K219 Gastro-esophageal reflux disease without esophagitis: Secondary | ICD-10-CM | POA: Diagnosis present

## 2014-09-21 DIAGNOSIS — R079 Chest pain, unspecified: Secondary | ICD-10-CM

## 2014-09-21 DIAGNOSIS — I252 Old myocardial infarction: Secondary | ICD-10-CM | POA: Diagnosis not present

## 2014-09-21 DIAGNOSIS — R911 Solitary pulmonary nodule: Secondary | ICD-10-CM

## 2014-09-21 DIAGNOSIS — I2699 Other pulmonary embolism without acute cor pulmonale: Principal | ICD-10-CM | POA: Diagnosis present

## 2014-09-21 DIAGNOSIS — M79605 Pain in left leg: Secondary | ICD-10-CM

## 2014-09-21 DIAGNOSIS — R918 Other nonspecific abnormal finding of lung field: Secondary | ICD-10-CM | POA: Diagnosis present

## 2014-09-21 DIAGNOSIS — I248 Other forms of acute ischemic heart disease: Secondary | ICD-10-CM | POA: Diagnosis present

## 2014-09-21 DIAGNOSIS — Z7902 Long term (current) use of antithrombotics/antiplatelets: Secondary | ICD-10-CM | POA: Diagnosis not present

## 2014-09-21 DIAGNOSIS — Z7982 Long term (current) use of aspirin: Secondary | ICD-10-CM

## 2014-09-21 DIAGNOSIS — I10 Essential (primary) hypertension: Secondary | ICD-10-CM | POA: Diagnosis present

## 2014-09-21 DIAGNOSIS — R591 Generalized enlarged lymph nodes: Secondary | ICD-10-CM | POA: Diagnosis present

## 2014-09-21 DIAGNOSIS — Z955 Presence of coronary angioplasty implant and graft: Secondary | ICD-10-CM

## 2014-09-21 DIAGNOSIS — M7989 Other specified soft tissue disorders: Secondary | ICD-10-CM

## 2014-09-21 DIAGNOSIS — I2510 Atherosclerotic heart disease of native coronary artery without angina pectoris: Secondary | ICD-10-CM | POA: Diagnosis present

## 2014-09-21 DIAGNOSIS — Z720 Tobacco use: Secondary | ICD-10-CM | POA: Diagnosis present

## 2014-09-21 DIAGNOSIS — F17210 Nicotine dependence, cigarettes, uncomplicated: Secondary | ICD-10-CM | POA: Diagnosis present

## 2014-09-21 HISTORY — DX: Other pulmonary embolism without acute cor pulmonale: I26.99

## 2014-09-21 LAB — BASIC METABOLIC PANEL
ANION GAP: 6 (ref 5–15)
BUN: 11 mg/dL (ref 6–23)
CO2: 21 mmol/L (ref 19–32)
Calcium: 8.2 mg/dL — ABNORMAL LOW (ref 8.4–10.5)
Chloride: 109 mmol/L (ref 96–112)
Creatinine, Ser: 0.73 mg/dL (ref 0.50–1.35)
GFR calc Af Amer: >90 mL/min (ref >90)
Glucose, Bld: 100 mg/dL — ABNORMAL HIGH (ref 70–99)
POTASSIUM: 3.8 mmol/L (ref 3.5–5.1)
SODIUM: 136 mmol/L (ref 135–145)

## 2014-09-21 LAB — CBC WITH DIFFERENTIAL/PLATELET
Basophils Absolute: 0 10*3/uL (ref 0.0–0.1)
Basophils Relative: 0 % (ref 0–1)
EOS ABS: 0.3 10*3/uL (ref 0.0–0.7)
Eosinophils Relative: 2 % (ref 0–5)
HCT: 38 % — ABNORMAL LOW (ref 39.0–52.0)
HEMOGLOBIN: 13.3 g/dL (ref 13.0–17.0)
LYMPHS PCT: 18 % (ref 12–46)
Lymphs Abs: 2.4 10*3/uL (ref 0.7–4.0)
MCH: 32.7 pg (ref 26.0–34.0)
MCHC: 35 g/dL (ref 30.0–36.0)
MCV: 93.4 fL (ref 78.0–100.0)
MONO ABS: 1.3 10*3/uL — AB (ref 0.1–1.0)
MONOS PCT: 9 % (ref 3–12)
NEUTROS PCT: 71 % (ref 43–77)
Neutro Abs: 9.5 10*3/uL — ABNORMAL HIGH (ref 1.7–7.7)
Platelets: 204 10*3/uL (ref 150–400)
RBC: 4.07 MIL/uL — AB (ref 4.22–5.81)
RDW: 12.3 % (ref 11.5–15.5)
WBC: 13.5 10*3/uL — AB (ref 4.0–10.5)

## 2014-09-21 LAB — TROPONIN I
Troponin I: 0.77 ng/mL (ref <0.031)
Troponin I: 1.38 ng/mL (ref <0.031)

## 2014-09-21 LAB — HEPATIC FUNCTION PANEL
ALBUMIN: 4.1 g/dL (ref 3.5–5.2)
ALK PHOS: 100 U/L (ref 39–117)
ALT: 25 U/L (ref 0–53)
AST: 21 U/L (ref 0–37)
Bilirubin, Direct: 0.2 mg/dL (ref 0.0–0.5)
Indirect Bilirubin: 0.8 mg/dL (ref 0.3–0.9)
TOTAL PROTEIN: 7.2 g/dL (ref 6.0–8.3)
Total Bilirubin: 1 mg/dL (ref 0.3–1.2)

## 2014-09-21 LAB — MAGNESIUM: MAGNESIUM: 1.9 mg/dL (ref 1.5–2.5)

## 2014-09-21 LAB — MRSA PCR SCREENING: MRSA BY PCR: NEGATIVE

## 2014-09-21 LAB — HEPARIN LEVEL (UNFRACTIONATED): Heparin Unfractionated: 0.3 IU/mL (ref 0.30–0.70)

## 2014-09-21 LAB — TSH: TSH: 3.398 u[IU]/mL (ref 0.350–4.500)

## 2014-09-21 MED ORDER — METOPROLOL TARTRATE 12.5 MG HALF TABLET
12.5000 mg | ORAL_TABLET | Freq: Two times a day (BID) | ORAL | Status: DC
  Administered 2014-09-21 – 2014-09-24 (×6): 12.5 mg via ORAL
  Filled 2014-09-21 (×7): qty 1

## 2014-09-21 MED ORDER — ONDANSETRON HCL 4 MG PO TABS: 4.0000 mg | ORAL_TABLET | Freq: Four times a day (QID) | ORAL | Status: DC | PRN

## 2014-09-21 MED ORDER — ONDANSETRON HCL 4 MG/2ML IJ SOLN: 4.0000 mg | Freq: Four times a day (QID) | INTRAMUSCULAR | Status: DC | PRN

## 2014-09-21 MED ORDER — SODIUM CHLORIDE 0.9 % IJ SOLN
3.0000 mL | Freq: Two times a day (BID) | INTRAMUSCULAR | Status: DC
  Administered 2014-09-22 – 2014-09-23 (×2): 3 mL via INTRAVENOUS

## 2014-09-21 MED ORDER — IPRATROPIUM BROMIDE 0.02 % IN SOLN
0.5000 mg | Freq: Four times a day (QID) | RESPIRATORY_TRACT | Status: DC
  Filled 2014-09-21: qty 2.5

## 2014-09-21 MED ORDER — HEPARIN BOLUS VIA INFUSION
4500.0000 [IU] | Freq: Once | INTRAVENOUS | Status: AC
  Administered 2014-09-21: 4500 [IU] via INTRAVENOUS
  Filled 2014-09-21: qty 4500

## 2014-09-21 MED ORDER — LEVALBUTEROL HCL 0.63 MG/3ML IN NEBU
0.6300 mg | INHALATION_SOLUTION | Freq: Four times a day (QID) | RESPIRATORY_TRACT | Status: DC | PRN
  Filled 2014-09-21: qty 3

## 2014-09-21 MED ORDER — IOHEXOL 300 MG/ML  SOLN
100.0000 mL | Freq: Once | INTRAMUSCULAR | Status: AC | PRN
  Administered 2014-09-21: 100 mL via INTRAVENOUS

## 2014-09-21 MED ORDER — ATORVASTATIN CALCIUM 80 MG PO TABS
80.0000 mg | ORAL_TABLET | Freq: Every day | ORAL | Status: DC
  Administered 2014-09-22 – 2014-09-24 (×3): 80 mg via ORAL
  Filled 2014-09-21 (×3): qty 1

## 2014-09-21 MED ORDER — SODIUM CHLORIDE 0.9 % IV SOLN: INTRAVENOUS | Status: AC

## 2014-09-21 MED ORDER — HYDROMORPHONE HCL 1 MG/ML IJ SOLN: 1.0000 mg | INTRAMUSCULAR | Status: DC | PRN

## 2014-09-21 MED ORDER — MORPHINE SULFATE 4 MG/ML IJ SOLN: 4.0000 mg | Freq: Once | INTRAMUSCULAR | Status: DC

## 2014-09-21 MED ORDER — DOCUSATE SODIUM 100 MG PO CAPS
100.0000 mg | ORAL_CAPSULE | Freq: Two times a day (BID) | ORAL | Status: DC
  Administered 2014-09-21 – 2014-09-24 (×6): 100 mg via ORAL
  Filled 2014-09-21 (×6): qty 1

## 2014-09-21 MED ORDER — IPRATROPIUM BROMIDE 0.02 % IN SOLN: 0.5000 mg | Freq: Four times a day (QID) | RESPIRATORY_TRACT | Status: DC | PRN

## 2014-09-21 MED ORDER — HEPARIN (PORCINE) IN NACL 100-0.45 UNIT/ML-% IJ SOLN
1300.0000 [IU]/h | INTRAMUSCULAR | Status: DC
  Administered 2014-09-21: 1300 [IU]/h via INTRAVENOUS
  Filled 2014-09-21: qty 250

## 2014-09-21 MED ORDER — CLOPIDOGREL BISULFATE 75 MG PO TABS
75.0000 mg | ORAL_TABLET | Freq: Every day | ORAL | Status: DC
  Administered 2014-09-22 – 2014-09-24 (×3): 75 mg via ORAL
  Filled 2014-09-21 (×3): qty 1

## 2014-09-21 MED ORDER — ASPIRIN 81 MG PO CHEW
81.0000 mg | CHEWABLE_TABLET | Freq: Every day | ORAL | Status: DC
Start: 2014-09-22 — End: 2014-09-24
  Administered 2014-09-22 – 2014-09-24 (×3): 81 mg via ORAL
  Filled 2014-09-21 (×3): qty 1

## 2014-09-21 MED ORDER — ONDANSETRON HCL 4 MG/2ML IJ SOLN
4.0000 mg | Freq: Once | INTRAMUSCULAR | Status: DC
Start: 2014-09-21 — End: 2014-09-21

## 2014-09-21 NOTE — Consult Note (Signed)
Patient ID: [name] MRN: [id number], DOB/AGE: 1961-02-26   Admit date: 09/21/2014   Reason for Consult: Left shoulder pain + elevated troponin   Primary Physician: [name], MD Primary Cardiologist: Dr. Martinique  Pt. Profile:  54 y/o male with known CAD presenting to ED with chest pain  Problem List  Past Medical History  Diagnosis Date  . Coronary artery disease     a. s/p Taxus DES to CFX 8/06 in setting of NSTEMI;  b.  s/p Inf STEMI 06/21/11:  mRCA occluded>> PCI:  Overlapping Promus DES x 2 to RCA.;  c.  LHC (11/15):  pLAD 20-30%, oDx branches 50-60%, mCFX stent 95% ISR, dCFX stent ok, mRCA stent occluded with L-R collats, EF 50-55%, basal inf AK, mid Inf HK >> PCI:  3.25 x 23 mm Xience DES to [place]  . HLD (hyperlipidemia)   . HTN (hypertension)     "took me off my BP pills" (06/20/2014)  . Myocardial infarction 02/2005; 05/2011  . GERD (gastroesophageal reflux disease)     hx (06/20/2014)  . History of gout     Past Surgical History  Procedure Laterality Date  . Coronary angioplasty with stent placement  02/2005; 05/2011; 06/21/2015    "1; ?2; 1"  . Left heart cath N/A 06/21/2011    Procedure: LEFT HEART CATH;  Surgeon: [name], MD;  Location: [facility] CATH LAB;  Service: Cardiovascular;  Laterality: N/A;  . Left heart catheterization with coronary angiogram N/A 06/20/2014    Procedure: LEFT HEART CATHETERIZATION WITH CORONARY ANGIOGRAM;  Surgeon: Peter M Martinique, MD;  Location: [facility] CATH LAB;  Service: Cardiovascular;  Laterality: N/A;     Allergies  No Known Allergies  HPI The patient is a 54 y/o male followed by Dr. Martinique for coronary disease, HTN and HLD. He is also a chronic smoker. He has a history of inferior MI. The patient has had acute coronary syndrome on 2 separate occasions (2007 and 2014) treated with PCI in both settings. He was evaluated in November 2015 with progressive chest pain at rest and with low-level exertion. He underwent cardiac  catheterization demonstrating total occlusion of the right coronary artery within the previously stented segment and severe in-stent restenosis of the left circumflex. He was treated with PCI of the left circumflex using a drug-eluting stent. The right coronary artery was collateralized from the left and there was a wall motion abnormality consistent with his old inferior MI. LV function was preserved with an ejection fraction of 50-55%. He was placed on DAPT with ASA + Plavix as well as BB and statin therapy. His last OV with Dr. Martinique was 06/28/14 and he was felt to be stable from a cardiac standpoint.   He presents to the ED today with complaints of left shoulder pain. Symptoms first began last PM ~10 minutes after eating dinner. Described as a dull ache. Non radiating. No chest pain. However he recalls having shoulder pain at the time of his prior MI. He had no associated dyspnea, diaphoresis, lightheadness or dizziness. Was not worse with exertion. He took a pain pill at home and the pain was relieved after 15 minutes. Today, he had a second occurrence that occurred around 12 PM while in the waiting room at the doctor's office. He was being evaluated at a local vein clinic for assessment for possible DVT of the left lower exremity. He reports the development of left calf pain and swelling 3 days ago. Per patient report, Doppler studies  were normal. While in the waiting room he developed recurrent left shoulder pain similar to the discomfort he felt last night. However this episode was associated with nausea plus vomiting. EMS was called and he was transported to the emergency department. According to the patient he was not given nitroglycerin enroute. His pain resolved spontaneously after 15 minutes. He denies any further recurrence. He reports full medication compliance with all of his prescribed meds including aspirin and Plavix. Unfortunately he has continued to smoke on average 5-6 cigarettes a day.  In  the ED, he remains pain free. EKG shows NSR with Q waves and minimal ST elevations in the anterior leads. HR 74 bpm. CT of chest shows acute PE in the upper lobe branch of the right pulmonary artery. CXR shows prominent left hilum concerning for mass lesion. No other acute findings. CT also demonstrated 2.3 x 1.6 cm spiculated density seen in left suprahilar region concerning for malignancy. Immediately superior to this, 8 mm spiculated abnormality is noted in left lung apex concerning for malignancy is well. Left hilar, aorticopulmonary window and right peritracheal adenopathy is noted consistent with metastatic disease. Troponin is elevated at 0.77. SCr is stable at 0.73. He has a elevated WBC of 13.5.    Home Medications  Prior to Admission medications   Medication Sig Start Date End Date Taking? Authorizing Provider  aspirin 81 MG tablet Take 81 mg by mouth daily.   Yes Historical Provider, MD  atorvastatin (LIPITOR) 80 MG tablet Take 80 mg by mouth daily.   Yes Historical Provider, MD  atorvastatin (LIPITOR) 80 MG tablet TAKE ONE TABLET BY MOUTH ONCE DAILY 08/06/14  Yes Blane Ohara, MD  clopidogrel (PLAVIX) 75 MG tablet Take 75 mg by mouth daily.   Yes Historical Provider, MD  clopidogrel (PLAVIX) 75 MG tablet TAKE ONE TABLET BY MOUTH ONCE DAILY 07/17/14  Yes Blane Ohara, MD  metoprolol tartrate (LOPRESSOR) 25 MG tablet TAKE ONE-HALF TABLET BY MOUTH TWICE DAILY 01/22/14  Yes Blane Ohara, MD  nitroGLYCERIN (NITROSTAT) 0.4 MG SL tablet Place 1 tablet (0.4 mg total) under the tongue every 5 (five) minutes as needed for chest pain. 06/21/14  Yes Liliane Shi, PA-C    Family History  Family History  Problem Relation Age of Onset  . Hypertension Father     Social History  History   Social History  . Marital Status: Married    Spouse Name: N/A  . Number of Children: N/A  . Years of Education: N/A   Occupational History  . Not on file.   Social History Main Topics  .  Smoking status: Current Every Day Smoker -- 1.00 packs/day for 38 years    Types: Cigarettes  . Smokeless tobacco: Never Used  . Alcohol Use: No  . Drug Use: No  . Sexual Activity: Yes   Other Topics Concern  . Not on file   Social History Narrative     Review of Systems General:  No chills, fever, night sweats or weight changes.  Cardiovascular:  No chest pain, dyspnea on exertion, edema, orthopnea, palpitations, paroxysmal nocturnal dyspnea. Dermatological: No rash, lesions/masses Respiratory: No cough, dyspnea Urologic: No hematuria, dysuria Abdominal:   No nausea, vomiting, diarrhea, bright red blood per rectum, melena, or hematemesis Neurologic:  No visual changes, wkns, changes in mental status. All other systems reviewed and are otherwise negative except as noted above.  Physical Exam  Blood pressure 157/95, pulse 83, temperature 98.3 F (36.8 C), temperature source  Oral, resp. rate 18, height 5\' 9"  (1.753 m), weight 166 lb (75.297 kg), SpO2 98 %.  General: Pleasant, NAD Psych: Normal affect. Neuro: Alert and oriented X 3. Moves all extremities spontaneously. HEENT: Normal  Neck: Supple without bruits or JVD. Lungs:  Resp regular and unlabored, CTA. Heart: RRR no s3, s4, or 1/6 SM at RUSB. Abdomen: Soft, non-tender, non-distended, BS + x 4.  Extremities: No clubbing, cyanosis or edema. DP/PT/Radials 2+ and equal bilaterally.  Labs  Troponin (Point of Care Test) No results for input(s): TROPIPOC in the last 72 hours.  Recent Labs  09/21/14 1335  TROPONINI 0.77*   Lab Results  Component Value Date   WBC 13.5* 09/21/2014   HGB 13.3 09/21/2014   HCT 38.0* 09/21/2014   MCV 93.4 09/21/2014   PLT 204 09/21/2014     Recent Labs Lab 09/21/14 1335  NA 136  K 3.8  CL 109  CO2 21  BUN 11  CREATININE 0.73  CALCIUM 8.2*  GLUCOSE 100*   Lab Results  Component Value Date   CHOL 132 06/22/2011   HDL 35* 06/22/2011   LDLCALC 77 06/22/2011   TRIG 98  06/22/2011   No results found for: DDIMER   Radiology/Studies  Dg Chest 2 View  09/21/2014   CLINICAL DATA:  Chest pain.  Coronary disease  EXAM: CHEST  2 VIEW  COMPARISON:  06/24/2011  FINDINGS: Heart size normal. Right and left coronary artery stents. Negative for heart failure.  Prominent left hilum compared with prior studies which could represent mass or adenopathy. Further evaluation with CT is recommended.  Negative for pneumonia or effusion.  IMPRESSION: Prominent left hilum. Recommend CT chest with contrast to exclude mass lesion.  Right and left coronary artery stents.   Electronically Signed   By: Franchot Gallo M.D.   On: 09/21/2014 14:09   Ct Chest W Contrast  09/21/2014   CLINICAL DATA:  Left hilar prominence.  EXAM: CT CHEST WITH CONTRAST  TECHNIQUE: Multidetector CT imaging of the chest was performed during intravenous contrast administration.  CONTRAST:  128mL OMNIPAQUE IOHEXOL 300 MG/ML  SOLN  COMPARISON:  Chest radiograph of same day.  FINDINGS: No pneumothorax or plural effusion is noted. Right lung is clear. 2.3 x 1.6 cm spiculated abnormality is noted in left suprahilar region concerning for malignancy. Mild biapical scarring is noted. 8 mm spiculated density is noted in left lung apex concerning for malignancy. Left hilar adenopathy is noted measure 3.1 x 1.9 cm. Adenopathy is also noted in the aortopulmonary window with the largest measuring 2.3 x 1.4 cm. Right peritracheal lymph node measuring 12 x 9 mm is noted.  There is no evidence of thoracic aortic dissection or aneurysm. Filling defect is seen in upper lobe branch of right pulmonary artery consistent with pulmonary embolus. Visualized portion of upper abdomen appears normal. No significant osseous abnormality is noted in the chest.  IMPRESSION: 2.3 x 1.6 cm spiculated density seen in left suprahilar region concerning for malignancy. Immediately superior to this, 8 mm spiculated abnormality is noted in left lung apex  concerning for malignancy is well. Left hilar, aorticopulmonary window and right peritracheal adenopathy is noted consistent with metastatic disease.  Acute pulmonary embolus seen in upper lobe branch of right pulmonary artery. Critical Value/emergent results were called by telephone at the time of interpretation on 09/21/2014 at 4:35 pm to Dr. Glendell Docker , who verbally acknowledged these results.   Electronically Signed   By: Marijo Conception, M.D.  On: 09/21/2014 16:36    ECG  EKG shows NSR with Q waves and minimal ST elevations in the anterior leads. HR 74 bpm.   ASSESSMENT AND PLAN  1. Pulmonary embolism: CT of chest demonstrates acute pulmonary embolus in the upper lobe branch of the right pulmonary artery. Doppler studies were negative for DVT . Will need to anticoagulate. IV heparin per pharmacy for now.   2. CAD: He denies chest pain but he has experienced 2 days of intermittent left shoulder pain which he has also experienced in the past with his prior episodes. EKG shows sinus rhythm with minimal ST elevations in the anterior leads. Initial troponin in the ED is elevated at 0.77. Continue to cycle 3 to assess trend. He may need repeat ischemic evaluation with nuclear stress testing. IV heparin per pharmacy given acute PE. Continue medical therapy.   3. Abnormal chest x-ray: prominent left hilum concerning for mass lesion visualized. Follow-up CT demonstrated 2.3 x 1.6 cm spiculated density seen in left suprahilar regionconcerning for malignancy. Immediately superior to this, 8 mm spiculated abnormality is noted in left lung apex concerning for malignancy is well. Left hilar, aorticopulmonary window and right peritracheal adenopathy is noted consistent with metastatic disease. Recommend Pulmonary consult.  4. HTN: Moderately elevated. Continue current home medications  5. HLD: continue statin therapy.  6. Tobacco abuse: smoking cessation strongly advise  Given medical issues, we  recommend admission by Internal Medicine. We will continue to follow along.    Janee Morn, PA-C 09/21/2014, 5:06 PM   Patient seen and examined with Lyda Jester, PA-C. We discussed all aspects of the encounter. I agree with the assessment and plan as stated above.  It appears his symptoms are due to acute R-sided PE in the setting of new diagnosis of lung CA. I had long talk with him about this and he is understandably tearful. Admit to IM. Will need to start heparin and contact Pulmonary for further w/u. Check echo. With malignancy will likely need lovenox for anti-coagulation. We will follow as needed. Agree with trending troponin to make sure he is not having any myocardial ischemia in the background.   Numan Zylstra,MD 5:22 PM

## 2014-09-21 NOTE — H&P (Signed)
[facility] History and Physical  Pratik Dalziel Sanderson WCB:[id number] DOB: [birth year] DOA: 09/21/2014  Referring physician:  PCP: [name], MD   Chief Complaint: Left shoulder pain + elevated troponin  HPI:  54 y/o male followed by Dr. Martinique for coronary disease, HTN and HLD. He is also a chronic smoker for 37 years . He has a history of inferior MI. The patient has had acute coronary syndrome on 2 separate occasions (2007 and 2014) treated with PCI in both settings. He was evaluated in November 2015 with progressive chest pain at rest and with low-level exertion. He underwent cardiac catheterization demonstrating total occlusion of the right coronary artery within the previously stented segment and severe in-stent restenosis of the left circumflex. He was treated with PCI of the left circumflex using a drug-eluting stent.  LV function was preserved with an ejection fraction of 50-55%. He was placed on  ASA + Plavix . His last OV with Dr. Martinique was 06/28/14 and he was felt to be stable from a cardiac standpoint.   He presents to the ED today with complaints of left shoulder pain.   No chest pain.   He had no associated dyspnea, diaphoresis, lightheadness or dizziness.  Today, he had a second occurrence that occurred around 12 PM while in the waiting room at the doctor's office. He was  Evaluated  for possible DVT of the left lower exremity. He reports the development of left calf pain and swelling 3 days ago. Per patient report, Doppler studies were normal. While in the waiting room he developed recurrent left shoulder pain similar to the discomfort he felt last night.  In the ED, he remains pain free. EKG shows NSR with Q waves and minimal ST elevations in the anterior leads. HR 74 bpm. CT of chest shows acute PE in the upper lobe branch of the right pulmonary artery. CXR shows prominent left hilum concerning for mass lesion. No other acute findings. CT also demonstrated 2.3 x 1.6 cm spiculated  density seen in left suprahilar region concerning for malignancy. Immediately superior to this, 8 mm spiculated abnormality is noted in left lung apex concerning for malignancy is well. Left hilar, aorticopulmonary window and right peritracheal adenopathy is noted consistent with metastatic disease. Troponin is elevated at 0.77. SCr is stable at 0.73. He has a elevated WBC of 13.5.    Review of Systems: negative for the following  Constitutional: Denies fever, chills, diaphoresis, appetite change and fatigue.  HEENT: Denies photophobia, eye pain, redness, hearing loss, ear pain, congestion, sore throat, rhinorrhea, sneezing, mouth sores, trouble swallowing, neck pain, neck stiffness and tinnitus.  Respiratory: Denies SOB, DOE, cough, chest tightness, and wheezing.  Cardiovascular: Denies chest pain, palpitations and leg swelling.  Gastrointestinal: Denies nausea, vomiting, abdominal pain, diarrhea, constipation, blood in stool and abdominal distention.  Genitourinary: Denies dysuria, urgency, frequency, hematuria, flank pain and difficulty urinating.  Musculoskeletal:positive for shoulder pain , myalgias, back pain, joint swelling, arthralgias and gait problem.  Skin: Denies pallor, rash and wound.  Neurological: Denies dizziness, seizures, syncope, weakness, light-headedness, numbness and headaches.  Hematological: Denies adenopathy. Easy bruising, personal or family bleeding history  Psychiatric/Behavioral: Denies suicidal ideation, mood changes, confusion, nervousness, sleep disturbance and agitation       Past Medical History  Diagnosis Date  . Coronary artery disease     a. s/p Taxus DES to CFX 8/06 in setting of NSTEMI;  b.  s/p Inf STEMI 06/21/11:  mRCA occluded>> PCI:  Overlapping Promus DES x  2 to RCA.;  c.  LHC (11/15):  pLAD 20-30%, oDx branches 50-60%, mCFX stent 95% ISR, dCFX stent ok, mRCA stent occluded with L-R collats, EF 50-55%, basal inf AK, mid Inf HK >> PCI:  3.25 x 23 mm  Xience DES to [place]  . HLD (hyperlipidemia)   . HTN (hypertension)     "took me off my BP pills" (06/20/2014)  . Myocardial infarction 02/2005; 05/2011  . GERD (gastroesophageal reflux disease)     hx (06/20/2014)  . History of gout      Past Surgical History  Procedure Laterality Date  . Coronary angioplasty with stent placement  02/2005; 05/2011; 06/21/2015    "1; ?2; 1"  . Left heart cath N/A 06/21/2011    Procedure: LEFT HEART CATH;  Surgeon: [name], MD;  Location: [facility] CATH LAB;  Service: Cardiovascular;  Laterality: N/A;  . Left heart catheterization with coronary angiogram N/A 06/20/2014    Procedure: LEFT HEART CATHETERIZATION WITH CORONARY ANGIOGRAM;  Surgeon: Peter M Martinique, MD;  Location: [facility] CATH LAB;  Service: Cardiovascular;  Laterality: N/A;      Social History:  reports that he has been smoking Cigarettes.  He has a 38 pack-year smoking history. He has never used smokeless tobacco. He reports that he does not drink alcohol or use illicit drugs.    No Known Allergies  Family History  Problem Relation Age of Onset  . Hypertension Father          Prior to Admission medications   Medication Sig Start Date End Date Taking? Authorizing Provider  aspirin 81 MG tablet Take 81 mg by mouth daily.   Yes Historical Provider, MD  atorvastatin (LIPITOR) 80 MG tablet TAKE ONE TABLET BY MOUTH ONCE DAILY 08/06/14  Yes Blane Ohara, MD  clopidogrel (PLAVIX) 75 MG tablet TAKE ONE TABLET BY MOUTH ONCE DAILY 07/17/14  Yes Blane Ohara, MD  metoprolol tartrate (LOPRESSOR) 25 MG tablet TAKE ONE-HALF TABLET BY MOUTH TWICE DAILY 01/22/14  Yes Blane Ohara, MD  nitroGLYCERIN (NITROSTAT) 0.4 MG SL tablet Place 1 tablet (0.4 mg total) under the tongue every 5 (five) minutes as needed for chest pain. 06/21/14  Yes Liliane Shi, PA-C     Physical Exam: Filed Vitals:   09/21/14 1600 09/21/14 1630 09/21/14 1645 09/21/14 1700  BP: 174/90 157/95 137/88 178/102  Pulse: 87  83 86 85  Temp:      TempSrc:      Resp: 16 18 15 11   Height:  5\' 9"  (1.753 m)    Weight:  75.297 kg (166 lb)    SpO2: 98% 98% 97% 98%     Constitutional: .tearful, Patient is a well-developed and well-nourished in no acute distress and cooperative with exam. Alert and oriented x3.  Head: Normocephalic and atraumatic  Mouth: no erythema or exudates, MMM  Eyes: PERRL, EOMI, conjunctivae normal, No scleral icterus.  Neck: Supple, Trachea midline normal ROM, No JVD, mass, thyromegaly, or carotid bruit present.  Cardiovascular: RRR, S1 normal, S2 normal, no MRG, pulses symmetric and intact bilaterally  Pulmonary/Chest: CTAB, no wheezes, rales, or rhonchi  Abdominal: Soft. Non-tender, non-distended, bowel sounds are normal, no masses, organomegaly, or guarding present.  GU: no CVA tenderness Musculoskeletal: No joint deformities, erythema, or stiffness, ROM full and no nontender Ext: no edema and no cyanosis, pulses palpable bilaterally (DP and PT)  Hematology: no cervical, inginal, or axillary adenopathy.  Neurological: A&O x3, Strenght is normal and symmetric bilaterally, cranial nerve II-XII  are grossly intact, no focal motor deficit, sensory intact to light touch bilaterally.  Skin: Warm, dry and intact. No rash, cyanosis, or clubbing.  Psychiatric: Normal mood and affect. speech and behavior is normal. Judgment and thought content normal. Cognition and memory are normal.       Labs on Admission:    Basic Metabolic Panel:  Recent Labs Lab 09/21/14 1335  NA 136  K 3.8  CL 109  CO2 21  GLUCOSE 100*  BUN 11  CREATININE 0.73  CALCIUM 8.2*   Liver Function Tests: No results for input(s): AST, ALT, ALKPHOS, BILITOT, PROT, ALBUMIN in the last 168 hours. No results for input(s): LIPASE, AMYLASE in the last 168 hours. No results for input(s): AMMONIA in the last 168 hours. CBC:  Recent Labs Lab 09/21/14 1335  WBC 13.5*  NEUTROABS 9.5*  HGB 13.3  HCT 38.0*  MCV 93.4   PLT 204   Cardiac Enzymes:  Recent Labs Lab 09/21/14 1335  TROPONINI 0.77*    BNP (last 3 results) No results for input(s): BNP in the last 8760 hours.  ProBNP (last 3 results) No results for input(s): PROBNP in the last 8760 hours.     CBG: No results for input(s): GLUCAP in the last 168 hours.  Radiological Exams on Admission: Dg Chest 2 View  09/21/2014   CLINICAL DATA:  Chest pain.  Coronary disease  EXAM: CHEST  2 VIEW  COMPARISON:  06/24/2011  FINDINGS: Heart size normal. Right and left coronary artery stents. Negative for heart failure.  Prominent left hilum compared with prior studies which could represent mass or adenopathy. Further evaluation with CT is recommended.  Negative for pneumonia or effusion.  IMPRESSION: Prominent left hilum. Recommend CT chest with contrast to exclude mass lesion.  Right and left coronary artery stents.   Electronically Signed   By: Franchot Gallo M.D.   On: 09/21/2014 14:09   Ct Chest W Contrast  09/21/2014   CLINICAL DATA:  Left hilar prominence.  EXAM: CT CHEST WITH CONTRAST  TECHNIQUE: Multidetector CT imaging of the chest was performed during intravenous contrast administration.  CONTRAST:  175mL OMNIPAQUE IOHEXOL 300 MG/ML  SOLN  COMPARISON:  Chest radiograph of same day.  FINDINGS: No pneumothorax or plural effusion is noted. Right lung is clear. 2.3 x 1.6 cm spiculated abnormality is noted in left suprahilar region concerning for malignancy. Mild biapical scarring is noted. 8 mm spiculated density is noted in left lung apex concerning for malignancy. Left hilar adenopathy is noted measure 3.1 x 1.9 cm. Adenopathy is also noted in the aortopulmonary window with the largest measuring 2.3 x 1.4 cm. Right peritracheal lymph node measuring 12 x 9 mm is noted.  There is no evidence of thoracic aortic dissection or aneurysm. Filling defect is seen in upper lobe branch of right pulmonary artery consistent with pulmonary embolus. Visualized portion  of upper abdomen appears normal. No significant osseous abnormality is noted in the chest.  IMPRESSION: 2.3 x 1.6 cm spiculated density seen in left suprahilar region concerning for malignancy. Immediately superior to this, 8 mm spiculated abnormality is noted in left lung apex concerning for malignancy is well. Left hilar, aorticopulmonary window and right peritracheal adenopathy is noted consistent with metastatic disease.  Acute pulmonary embolus seen in upper lobe branch of right pulmonary artery. Critical Value/emergent results were called by telephone at the time of interpretation on 09/21/2014 at 4:35 pm to Dr. Glendell Docker , who verbally acknowledged these results.   Electronically Signed  By: Marijo Conception, M.D.   On: 09/21/2014 16:36    EKG: Independently reviewed. EKG shows NSR with Q waves and minimal ST elevations in the anterior leads. HR 74 bpm.    Assessment/Plan Active Problems:   Pulmonary embolism   PE (pulmonary embolism)  CAD with abnl troponin/PE  In the setting of a PE , seen by cards , recommends echo Cycle enzymes  Cont  dual anti-platelet therapy with ASA (lifelong) and plavix  - Statin therapy with aggressive LDL goal (ideally < 70) - Mandatory smoking cessation and counseling Heparin tonight ,transition to lovenox tomorrow  Admit to step down  HTN - Will follow BP trend and make adjustments accordingly - Beta-blockade with Metop 12.5mg  BID with titrations as necessary   Hyperlipidemia - Statin therapy with aggressive LDL goal (ideally <70)  Smoking cessation counselling done   Code Status:   full Family Communication: bedside Disposition Plan: admit   Time spent: 70 mins   [facility] Pager [phone]  If 7PM-7AM, please contact night-coverage [email or web address] Password [facility] 09/21/2014, 5:36 PM

## 2014-09-21 NOTE — Progress Notes (Signed)
Notified per Vascular Lab Tech, that pt. in sub-waiting area with c/o nausea and generally not feeling well.  Approached pt., sitting in chair, alert, oriented, color pale, with skin warm/dry.  Stated "I am going to be sick to my stomach."  Assisted to the exam room.  Related hx of being sent from PCP today, to have an ultrasound of his (L) leg to r/o DVT.  Pt. Vomited small amt.  Denied chest pain or SOB at this time.  Stated he was SOB yesterday, and had pain in his left shoulder area earlier today.  Stated he has had swelling in his left foot and ankle.  BP 156/89, Pulse 87, O2 Sat 100%.  Stated prior to being called from the waiting room, he saw flashes of light in both eyes; stated vision clear at this time.  Left LE venous duplex performed; scan negative for DVT.  EMS arrived @ 12:17 PM. and assessing pt. At time of this note.

## 2014-09-21 NOTE — ED Notes (Signed)
Pt reports to the ED for eval of left shoulder pain that developed while he was sitting in the waiting room of the cardiovascular vein specialist's office where he was being seen for an U/S of his lower leg to r/o DVT. Pt reports he had a similar episode last pm of left sided CP that radiated into his left shoulder pain. Reports he took a pain pill and went to bed. Pt also has left lower leg swelling. Today when the pain occurred he also had some associated N/V and visual disturbances. Pt reports the shoulder pain was similar to the pain he had when he had an MI and a stent placed. 12 lead unremarkable. Pt A&Ox4, resp e/u, and skin warm and dry.

## 2014-09-21 NOTE — ED Provider Notes (Addendum)
CSN: [id number]     Arrival date & time 09/21/14  1300 History   First MD Initiated Contact with Patient 09/21/14 1307     Chief Complaint  Patient presents with  . Shoulder Pain     (Consider location/radiation/quality/duration/timing/severity/associated sxs/prior Treatment) HPI Comments: Pt comes in with c/o left cp with radiation to the shoulder and scapula intermittently over the last 2 days. Some sob. Pt states that he was at his pcp for r/o dvt over the left lower leg as he has had some swelling to the area. He was negative for dvt. He states that he is pain free at this time.  The history is provided by the patient.    Past Medical History  Diagnosis Date  . Coronary artery disease     a. s/p Taxus DES to CFX 8/06 in setting of NSTEMI;  b.  s/p Inf STEMI 06/21/11:  mRCA occluded>> PCI:  Overlapping Promus DES x 2 to RCA.;  c.  LHC (11/15):  pLAD 20-30%, oDx branches 50-60%, mCFX stent 95% ISR, dCFX stent ok, mRCA stent occluded with L-R collats, EF 50-55%, basal inf AK, mid Inf HK >> PCI:  3.25 x 23 mm Xience DES to [place]  . HLD (hyperlipidemia)   . HTN (hypertension)     "took me off my BP pills" (06/20/2014)  . Myocardial infarction 02/2005; 05/2011  . GERD (gastroesophageal reflux disease)     hx (06/20/2014)  . History of gout    Past Surgical History  Procedure Laterality Date  . Coronary angioplasty with stent placement  02/2005; 05/2011; 06/21/2015    "1; ?2; 1"  . Left heart cath N/A 06/21/2011    Procedure: LEFT HEART CATH;  Surgeon: [name], MD;  Location: [facility] CATH LAB;  Service: Cardiovascular;  Laterality: N/A;  . Left heart catheterization with coronary angiogram N/A 06/20/2014    Procedure: LEFT HEART CATHETERIZATION WITH CORONARY ANGIOGRAM;  Surgeon: Peter M Martinique, MD;  Location: [facility] CATH LAB;  Service: Cardiovascular;  Laterality: N/A;   No family history on file. History  Substance Use Topics  . Smoking status: Current Every Day Smoker -- 1.00  packs/day for 38 years    Types: Cigarettes  . Smokeless tobacco: Never Used  . Alcohol Use: No    Review of Systems  All other systems reviewed and are negative.     Allergies  Review of patient's allergies indicates no known allergies.  Home Medications   Prior to Admission medications   Medication Sig Start Date End Date Taking? Authorizing Provider  aspirin 81 MG tablet Take 81 mg by mouth daily.   Yes Historical Provider, MD  atorvastatin (LIPITOR) 80 MG tablet Take 80 mg by mouth daily.   Yes Historical Provider, MD  atorvastatin (LIPITOR) 80 MG tablet TAKE ONE TABLET BY MOUTH ONCE DAILY 08/06/14  Yes Blane Ohara, MD  clopidogrel (PLAVIX) 75 MG tablet Take 75 mg by mouth daily.   Yes Historical Provider, MD  clopidogrel (PLAVIX) 75 MG tablet TAKE ONE TABLET BY MOUTH ONCE DAILY 07/17/14  Yes Blane Ohara, MD  metoprolol tartrate (LOPRESSOR) 25 MG tablet TAKE ONE-HALF TABLET BY MOUTH TWICE DAILY 01/22/14  Yes Blane Ohara, MD  nitroGLYCERIN (NITROSTAT) 0.4 MG SL tablet Place 1 tablet (0.4 mg total) under the tongue every 5 (five) minutes as needed for chest pain. 06/21/14  Yes Scott T Weaver, PA-C   BP 143/88 mmHg  Pulse 73  Temp(Src) 98.3 F (36.8 C) (Oral)  Resp  15  SpO2 98% Physical Exam  Constitutional: He is oriented to person, place, and time. He appears well-developed and well-nourished.  HENT:  Head: Normocephalic and atraumatic.  Cardiovascular: Normal rate and regular rhythm.   Pulmonary/Chest: Effort normal and breath sounds normal.  Abdominal: Soft. Bowel sounds are normal.  Musculoskeletal: Normal range of motion.  Swelling noted to the left ankle  Neurological: He is alert and oriented to person, place, and time.  Skin: Skin is warm and dry.  Psychiatric: He has a normal mood and affect.  Nursing note and vitals reviewed.   ED Course  Procedures (including critical care time) Labs Review Labs Reviewed  CBC WITH DIFFERENTIAL/PLATELET -  Abnormal; Notable for the following:    WBC 13.5 (*)    RBC 4.07 (*)    HCT 38.0 (*)    Neutro Abs 9.5 (*)    Monocytes Absolute 1.3 (*)    All other components within normal limits  BASIC METABOLIC PANEL - Abnormal; Notable for the following:    Glucose, Bld 100 (*)    Calcium 8.2 (*)    All other components within normal limits  TROPONIN I - Abnormal; Notable for the following:    Troponin I 0.77 (*)    All other components within normal limits    Imaging Review Dg Chest 2 View  09/21/2014   CLINICAL DATA:  Chest pain.  Coronary disease  EXAM: CHEST  2 VIEW  COMPARISON:  06/24/2011  FINDINGS: Heart size normal. Right and left coronary artery stents. Negative for heart failure.  Prominent left hilum compared with prior studies which could represent mass or adenopathy. Further evaluation with CT is recommended.  Negative for pneumonia or effusion.  IMPRESSION: Prominent left hilum. Recommend CT chest with contrast to exclude mass lesion.  Right and left coronary artery stents.   Electronically Signed   By: Franchot Gallo M.D.   On: 09/21/2014 14:09     EKG Interpretation   Date/Time:  Friday September 21 2014 13:07:23 EST Ventricular Rate:  74 PR Interval:  122 QRS Duration: 98 QT Interval:  397 QTC Calculation: 440 R Axis:   40 Text Interpretation:  Sinus rhythm RSR' in V1 or V2, probably normal  variant Probable inferior infarct, old Minimal ST elevation, anterior  leads Sinus rhythm RSR' pattern in V1 No significant change since last  tracing T wave abnormality Abnormal ekg Confirmed by Carmin Muskrat  MD  [phone]) on 09/21/2014 1:31:33 PM      MDM   Final diagnoses:  Chest pain  Pulmonary embolism  Lung nodule    3:22 PM Spoke with cardiology and they will see pt. Pt continues to be pain free. They will decide if heparin needed when they are seen 5:05 PM Discussed findings with pt. Pt verbalized understanding     Carmin Muskrat, MD 09/21/14 1538  Carmin Muskrat, MD 09/21/14 1652  Glendell Docker, NP 09/21/14 1706  Carmin Muskrat, MD 09/21/14 [phone]

## 2014-09-21 NOTE — Progress Notes (Signed)
ANTICOAGULATION CONSULT NOTE - Initial Consult  Pharmacy Consult for Heparin Indication: pulmonary embolus  No Known Allergies  Patient Measurements: Height: 5\' 9"  (175.3 cm) Weight: 166 lb (75.297 kg) IBW/kg (Calculated) : 70.7 Heparin Dosing Weight: 75 kg  Vital Signs: Temp: 98.3 F (36.8 C) (02/26 1309) Temp Source: Oral (02/26 1309) BP: 157/95 mmHg (02/26 1630) Pulse Rate: 83 (02/26 1630)  Labs:  Recent Labs  09/21/14 1335  HGB 13.3  HCT 38.0*  PLT 204  CREATININE 0.73  TROPONINI 0.77*    CrCl cannot be calculated (Unknown ideal weight.).   Medical History: Past Medical History  Diagnosis Date  . Coronary artery disease     a. s/p Taxus DES to CFX 8/06 in setting of NSTEMI;  b.  s/p Inf STEMI 06/21/11:  mRCA occluded>> PCI:  Overlapping Promus DES x 2 to RCA.;  c.  LHC (11/15):  pLAD 20-30%, oDx branches 50-60%, mCFX stent 95% ISR, dCFX stent ok, mRCA stent occluded with L-R collats, EF 50-55%, basal inf AK, mid Inf HK >> PCI:  3.25 x 23 mm Xience DES to [place]  . HLD (hyperlipidemia)   . HTN (hypertension)     "took me off my BP pills" (06/20/2014)  . Myocardial infarction 02/2005; 05/2011  . GERD (gastroesophageal reflux disease)     hx (06/20/2014)  . History of gout     Medications:   (Not in a hospital admission) Scheduled:  Infusions:   Assessment: 54yo male with history of CAD, HTN, and HLD presents with chest pain. Acute PE seen in upper lobe branch of R pulmonary artery on CT chest. Pharmacy is consulted to dose heparin for PE. CBC wnl, sCr 0.7, Trop 0.8.  Goal of Therapy:  Heparin level 0.3-0.7 units/ml Monitor platelets by anticoagulation protocol: Yes   Plan:  Give 4500 units bolus x 1 Start heparin infusion at 1300 units/hr Check anti-Xa level in 6 hours and daily while on heparin Continue to monitor H&H and platelets  Monitor s/sx of bleeding F/u long-term anticoag  Andrey Cota. Diona Foley, PharmD Clinical Pharmacist Pager  [phone] 09/21/2014,4:46 PM

## 2014-09-21 NOTE — ED Notes (Signed)
CRITICAL VALUE ALERT  Critical value received:  Troponin 0.77  Date of notification:  09/21/14  Time of notification:  3419  Critical value read back: Yes  Nurse who received alert:  Elyn Peers   MD notified (1st page): Md Vanita Panda

## 2014-09-22 ENCOUNTER — Encounter (HOSPITAL_COMMUNITY): Payer: Self-pay | Admitting: General Practice

## 2014-09-22 ENCOUNTER — Inpatient Hospital Stay (HOSPITAL_COMMUNITY): Payer: 59

## 2014-09-22 DIAGNOSIS — I2699 Other pulmonary embolism without acute cor pulmonale: Secondary | ICD-10-CM

## 2014-09-22 DIAGNOSIS — R079 Chest pain, unspecified: Secondary | ICD-10-CM

## 2014-09-22 LAB — TROPONIN I: TROPONIN I: 1.08 ng/mL — AB (ref <0.031)

## 2014-09-22 LAB — URINALYSIS, ROUTINE W REFLEX MICROSCOPIC
Bilirubin Urine: NEGATIVE
Glucose, UA: NEGATIVE mg/dL
Hgb urine dipstick: NEGATIVE
Ketones, ur: NEGATIVE mg/dL
Leukocytes, UA: NEGATIVE
Nitrite: NEGATIVE
PROTEIN: NEGATIVE mg/dL
SPECIFIC GRAVITY, URINE: 1.015 (ref 1.005–1.030)
UROBILINOGEN UA: 1 mg/dL (ref 0.0–1.0)
pH: 6.5 (ref 5.0–8.0)

## 2014-09-22 LAB — COMPREHENSIVE METABOLIC PANEL
ALT: 22 U/L (ref 0–53)
AST: 20 U/L (ref 0–37)
Albumin: 3.6 g/dL (ref 3.5–5.2)
Alkaline Phosphatase: 90 U/L (ref 39–117)
Anion gap: 11 (ref 5–15)
BILIRUBIN TOTAL: 0.8 mg/dL (ref 0.3–1.2)
BUN: 10 mg/dL (ref 6–23)
CHLORIDE: 102 mmol/L (ref 96–112)
CO2: 23 mmol/L (ref 19–32)
Calcium: 8.9 mg/dL (ref 8.4–10.5)
Creatinine, Ser: 0.87 mg/dL (ref 0.50–1.35)
GFR calc non Af Amer: >90 mL/min (ref >90)
Glucose, Bld: 103 mg/dL — ABNORMAL HIGH (ref 70–99)
POTASSIUM: 4 mmol/L (ref 3.5–5.1)
Sodium: 136 mmol/L (ref 135–145)
TOTAL PROTEIN: 6.7 g/dL (ref 6.0–8.3)

## 2014-09-22 LAB — CBC
HCT: 40 % (ref 39.0–52.0)
Hemoglobin: 13.6 g/dL (ref 13.0–17.0)
MCH: 32 pg (ref 26.0–34.0)
MCHC: 34 g/dL (ref 30.0–36.0)
MCV: 94.1 fL (ref 78.0–100.0)
PLATELETS: 240 10*3/uL (ref 150–400)
RBC: 4.25 MIL/uL (ref 4.22–5.81)
RDW: 12.4 % (ref 11.5–15.5)
WBC: 12.7 10*3/uL — ABNORMAL HIGH (ref 4.0–10.5)

## 2014-09-22 LAB — HEPARIN LEVEL (UNFRACTIONATED)
Heparin Unfractionated: 0.12 IU/mL — ABNORMAL LOW (ref 0.30–0.70)
Heparin Unfractionated: 0.79 IU/mL — ABNORMAL HIGH (ref 0.30–0.70)
Heparin Unfractionated: 0.71 IU/mL — ABNORMAL HIGH (ref 0.30–0.70)

## 2014-09-22 MED ORDER — HEPARIN (PORCINE) IN NACL 100-0.45 UNIT/ML-% IJ SOLN
1500.0000 [IU]/h | INTRAMUSCULAR | Status: DC
  Administered 2014-09-23: 1500 [IU]/h via INTRAVENOUS
  Filled 2014-09-22 (×2): qty 250

## 2014-09-22 MED ORDER — HEPARIN (PORCINE) IN NACL 100-0.45 UNIT/ML-% IJ SOLN
1400.0000 [IU]/h | INTRAMUSCULAR | Status: DC
  Administered 2014-09-22: 1400 [IU]/h via INTRAVENOUS

## 2014-09-22 MED ORDER — HEPARIN (PORCINE) IN NACL 100-0.45 UNIT/ML-% IJ SOLN
1700.0000 [IU]/h | INTRAMUSCULAR | Status: DC
  Administered 2014-09-22: 1700 [IU]/h via INTRAVENOUS
  Filled 2014-09-22: qty 250

## 2014-09-22 MED ORDER — ZOLPIDEM TARTRATE 5 MG PO TABS
5.0000 mg | ORAL_TABLET | Freq: Every evening | ORAL | Status: DC | PRN
  Administered 2014-09-22 – 2014-09-23 (×3): 5 mg via ORAL
  Filled 2014-09-22 (×3): qty 1

## 2014-09-22 MED ORDER — GADOBENATE DIMEGLUMINE 529 MG/ML IV SOLN
15.0000 mL | Freq: Once | INTRAVENOUS | Status: AC | PRN
  Administered 2014-09-22: 15 mL via INTRAVENOUS

## 2014-09-22 MED ORDER — HEPARIN BOLUS VIA INFUSION
2500.0000 [IU] | Freq: Once | INTRAVENOUS | Status: AC
  Administered 2014-09-22: 2500 [IU] via INTRAVENOUS
  Filled 2014-09-22: qty 2500

## 2014-09-22 NOTE — Progress Notes (Signed)
  Echocardiogram 2D Echocardiogram has been performed.  [name] 09/22/2014, 3:11 PM

## 2014-09-22 NOTE — Progress Notes (Signed)
Pt's troponin 1.38. Pt resting in bed CP free. Heparin drip running. Md on call made aware. Will cont to monitor pt.

## 2014-09-22 NOTE — Consult Note (Signed)
PULMONARY  / CRITICAL CARE MEDICINE CONSULTATION   Name: [name] MRN: [id number] DOB: [birth year]    ADMISSION DATE:  09/21/2014 CONSULTATION DATE: September 22, 2014  REQUESTING CLINICIAN: Reyne Dumas, MD  PRIMARY SERVICE: [facility]  CHIEF COMPLAINT:  Chest Pain  BRIEF PATIENT DESCRIPTION: 54 y/o man admitted for chest pain, found to have PE, as well as left upper lobe mass.  SIGNIFICANT EVENTS / STUDIES:  CT w/ contrast of chest 2/26   HISTORY OF PRESENT ILLNESS:   [name] is a 54 year old man with no significant past medical history except for 37 year tobacco history who presented to the ED with shoulder and chest pain and was found to have a PE, as well a left upper lobe mass. He was started on a heparin gtt, admitted, and pulmonary medicine is consulted to further evaluate.  PAST MEDICAL HISTORY :  Past Medical History  Diagnosis Date  . HLD (hyperlipidemia)   . HTN (hypertension)     "took me off my BP pills" (06/20/2014)  . GERD (gastroesophageal reflux disease)     hx (06/20/2014)  . History of gout   . Coronary artery disease     a. s/p Taxus DES to CFX 8/06 in setting of NSTEMI;  b.  s/p Inf STEMI 06/21/11:  mRCA occluded>> PCI:  Overlapping Promus DES x 2 to RCA.;  c.  LHC (11/15):  pLAD 20-30%, oDx branches 50-60%, mCFX stent 95% ISR, dCFX stent ok, mRCA stent occluded with L-R collats, EF 50-55%, basal inf AK, mid Inf HK >> PCI:  3.25 x 23 mm Xience DES to [place]  . Myocardial infarction 02/2005; 05/2011  . Pulmonary embolism 09/21/2014   Past Surgical History  Procedure Laterality Date  . Left heart cath N/A 06/21/2011    Procedure: LEFT HEART CATH;  Surgeon: [name], MD;  Location: [facility] CATH LAB;  Service: Cardiovascular;  Laterality: N/A;  . Left heart catheterization with coronary angiogram N/A 06/20/2014    Procedure: LEFT HEART CATHETERIZATION WITH CORONARY ANGIOGRAM;  Surgeon: Peter M Martinique, MD;  Location: [facility] CATH LAB;  Service:  Cardiovascular;  Laterality: N/A;  . Coronary angioplasty with stent placement  02/2005; 05/2011; 06/20/2014    "1; ?2; 1"   Prior to Admission medications   Medication Sig Start Date End Date Taking? Authorizing Provider  aspirin 81 MG tablet Take 81 mg by mouth daily.   Yes Historical Provider, MD  atorvastatin (LIPITOR) 80 MG tablet TAKE ONE TABLET BY MOUTH ONCE DAILY 08/06/14  Yes Blane Ohara, MD  clopidogrel (PLAVIX) 75 MG tablet TAKE ONE TABLET BY MOUTH ONCE DAILY 07/17/14  Yes Blane Ohara, MD  metoprolol tartrate (LOPRESSOR) 25 MG tablet TAKE ONE-HALF TABLET BY MOUTH TWICE DAILY 01/22/14  Yes Blane Ohara, MD  nitroGLYCERIN (NITROSTAT) 0.4 MG SL tablet Place 1 tablet (0.4 mg total) under the tongue every 5 (five) minutes as needed for chest pain. 06/21/14  Yes Liliane Shi, PA-C   No Known Allergies  FAMILY HISTORY:  Family History  Problem Relation Age of Onset  . Hypertension Father    SOCIAL HISTORY:  reports that he has been smoking Cigarettes.  He has a 15.6 pack-year smoking history. He has never used smokeless tobacco. He reports that he does not drink alcohol or use illicit drugs.  REVIEW OF SYSTEMS:  Per HPI.  SUBJECTIVE:   VITAL SIGNS: Temp:  [97.9 F (36.6 C)-98.3 F (36.8 C)] 97.9 F (36.6 C) (02/26 2029)  Pulse Rate:  [68-90] 90 (02/26 2029) Resp:  [11-21] 18 (02/26 2029) BP: (137-178)/(73-131) 143/73 mmHg (02/26 2029) SpO2:  [97 %-99 %] 98 % (02/26 2029) Weight:  [166 lb (75.297 kg)] 166 lb (75.297 kg) (02/26 1630) HEMODYNAMICS:      INTAKE / OUTPUT: Intake/Output      02/26 0701 - 02/27 0700   Urine (mL/kg/hr) 300   Total Output 300   Net -300         PHYSICAL EXAMINATION: Physical Exam  Constitutional: He is oriented to person, place, and time. He appears well-developed and well-nourished. No distress.  HENT:  Head: Normocephalic.  Nose: Nose normal.  Eyes: EOM are normal. Pupils are equal, round, and reactive to light. Right  eye exhibits no discharge. Left eye exhibits no discharge. No scleral icterus.  Neck: Neck supple. No tracheal deviation present.  Cardiovascular: Normal rate and regular rhythm.  Exam reveals no gallop.   No murmur heard. Pulmonary/Chest: Effort normal. No respiratory distress. He has no wheezes. He has no rales. He exhibits no tenderness.  Abdominal: Soft. He exhibits no distension. There is no tenderness.  Musculoskeletal: Normal range of motion.  Lymphadenopathy:    He has no cervical adenopathy.  Neurological: He is alert and oriented to person, place, and time. No cranial nerve deficit. Coordination normal.  Skin: Skin is warm and dry. No rash noted.      LABS:  CBC  Recent Labs Lab 09/21/14 1335  WBC 13.5*  HGB 13.3  HCT 38.0*  PLT 204   Coag's No results for input(s): APTT, INR in the last 168 hours. BMET  Recent Labs Lab 09/21/14 1335  NA 136  K 3.8  CL 109  CO2 21  BUN 11  CREATININE 0.73  GLUCOSE 100*   Electrolytes  Recent Labs Lab 09/21/14 1335 09/21/14 1918  CALCIUM 8.2*  --   MG  --  1.9   Sepsis Markers No results for input(s): LATICACIDVEN, PROCALCITON, O2SATVEN in the last 168 hours. ABG No results for input(s): PHART, PCO2ART, PO2ART in the last 168 hours. Liver Enzymes  Recent Labs Lab 09/21/14 1918  AST 21  ALT 25  ALKPHOS 100  BILITOT 1.0  ALBUMIN 4.1   Cardiac Enzymes  Recent Labs Lab 09/21/14 1335 09/21/14 1918 09/21/14 2331  TROPONINI 0.77* 1.38* 1.25*   Glucose No results for input(s): GLUCAP in the last 168 hours.  Imaging Dg Chest 2 View  09/21/2014   CLINICAL DATA:  Chest pain.  Coronary disease  EXAM: CHEST  2 VIEW  COMPARISON:  06/24/2011  FINDINGS: Heart size normal. Right and left coronary artery stents. Negative for heart failure.  Prominent left hilum compared with prior studies which could represent mass or adenopathy. Further evaluation with CT is recommended.  Negative for pneumonia or effusion.   IMPRESSION: Prominent left hilum. Recommend CT chest with contrast to exclude mass lesion.  Right and left coronary artery stents.   Electronically Signed   By: Franchot Gallo M.D.   On: 09/21/2014 14:09   Ct Chest W Contrast  09/21/2014   CLINICAL DATA:  Left hilar prominence.  EXAM: CT CHEST WITH CONTRAST  TECHNIQUE: Multidetector CT imaging of the chest was performed during intravenous contrast administration.  CONTRAST:  148mL OMNIPAQUE IOHEXOL 300 MG/ML  SOLN  COMPARISON:  Chest radiograph of same day.  FINDINGS: No pneumothorax or plural effusion is noted. Right lung is clear. 2.3 x 1.6 cm spiculated abnormality is noted in left suprahilar region concerning for  malignancy. Mild biapical scarring is noted. 8 mm spiculated density is noted in left lung apex concerning for malignancy. Left hilar adenopathy is noted measure 3.1 x 1.9 cm. Adenopathy is also noted in the aortopulmonary window with the largest measuring 2.3 x 1.4 cm. Right peritracheal lymph node measuring 12 x 9 mm is noted.  There is no evidence of thoracic aortic dissection or aneurysm. Filling defect is seen in upper lobe branch of right pulmonary artery consistent with pulmonary embolus. Visualized portion of upper abdomen appears normal. No significant osseous abnormality is noted in the chest.  IMPRESSION: 2.3 x 1.6 cm spiculated density seen in left suprahilar region concerning for malignancy. Immediately superior to this, 8 mm spiculated abnormality is noted in left lung apex concerning for malignancy is well. Left hilar, aorticopulmonary window and right peritracheal adenopathy is noted consistent with metastatic disease.  Acute pulmonary embolus seen in upper lobe branch of right pulmonary artery. Critical Value/emergent results were called by telephone at the time of interpretation on 09/21/2014 at 4:35 pm to Dr. Glendell Docker , who verbally acknowledged these results.   Electronically Signed   By: Marijo Conception, M.D.   On:  09/21/2014 16:36    EKG: Normal Chest CT: L apical mass with associated adenopathy. Small segmental PE on right upper lobe.  ASSESSMENT / PLAN:  PULMONARY A: Acute PE Left lung mass with lyphadenopathy P:   Suspicious for primary lung malignancy with spread to the hilar node. Would benefit from diagnosis, likely EBUS. Will first need to determine timing with anticoagulation, and defer this to our advanced endoscopists. It is certainly reasonable to work up the mass as an outpatient. Will obtain brain MRI and PET/CT to further assess for metastatic disease, as well as to determine where/how to EBUS in the chest.  CARDIOVASCULAR A: NSTEMI P:   Likely demand. Is on heparin gtt, do not recommend further workup unless troponin becomes significantly more elevated.  RENAL A: Normal renal function No acute issues P:     GASTROINTESTINAL A: NO acute issues. P:    HEMATOLOGIC A: Lung mass suspicious for cancer with associated lymphadenopathy.  Need for anticoagulation for acute PE. P:   Will discuss timing of biopsy in re: need for continued PE with our advanced bronchoscopists .  INFECTIOUS A: No active issues P:    ENDOCRINE A: No active issues P:    NEUROLOGIC A: No active issues.  TODAY'S SUMMARY: 54 y/o man with PE and imaging concerning for lung cancer.  I have personally obtained a history, examined the patient, evaluated laboratory and imaging results, formulated the assessment and plan and placed orders.  Luz Brazen, MD Pulmonary & Critical Care Medicine September 22, 2014, 4:10 AM   09/22/2014, 3:59 AM

## 2014-09-22 NOTE — Progress Notes (Signed)
ANTICOAGULATION CONSULT NOTE - follow up  Pharmacy Consult for Heparin Indication: pulmonary embolus  No Known Allergies  Patient Measurements: Height: 5\' 9"  (175.3 cm) Weight: 166 lb 14.4 oz (75.705 kg) IBW/kg (Calculated) : 70.7 Heparin Dosing Weight: 75 kg  Vital Signs: Temp: 99.2 F (37.3 C) (02/27 0753) Temp Source: Oral (02/27 0753) BP: 136/81 mmHg (02/27 0753) Pulse Rate: 73 (02/27 0753)  Labs:  Recent Labs  09/21/14 1335 09/21/14 1918 09/21/14 2330 09/21/14 2331 09/22/14 0457 09/22/14 0630  HGB 13.3  --   --   --  13.6  --   HCT 38.0*  --   --   --  40.0  --   PLT 204  --   --   --  240  --   HEPARINUNFRC  --   --  0.30  --   --  0.12*  CREATININE 0.73  --   --   --  0.87  --   TROPONINI 0.77* 1.38*  --  1.25* 1.08*  --     Estimated Creatinine Clearance: 99.3 mL/min (by C-G formula based on Cr of 0.87).   Medical History: Past Medical History  Diagnosis Date  . HLD (hyperlipidemia)   . HTN (hypertension)     "took me off my BP pills" (06/20/2014)  . GERD (gastroesophageal reflux disease)     hx (06/20/2014)  . History of gout   . Coronary artery disease     a. s/p Taxus DES to CFX 8/06 in setting of NSTEMI;  b.  s/p Inf STEMI 06/21/11:  mRCA occluded>> PCI:  Overlapping Promus DES x 2 to RCA.;  c.  LHC (11/15):  pLAD 20-30%, oDx branches 50-60%, mCFX stent 95% ISR, dCFX stent ok, mRCA stent occluded with L-R collats, EF 50-55%, basal inf AK, mid Inf HK >> PCI:  3.25 x 23 mm Xience DES to [place]  . Myocardial infarction 02/2005; 05/2011  . Pulmonary embolism 09/21/2014    Medications:  Prescriptions prior to admission  Medication Sig Dispense Refill Last Dose  . aspirin 81 MG tablet Take 81 mg by mouth daily.   09/21/2014 at Unknown time  . atorvastatin (LIPITOR) 80 MG tablet TAKE ONE TABLET BY MOUTH ONCE DAILY 30 tablet 5 09/21/2014 at Unknown time  . clopidogrel (PLAVIX) 75 MG tablet TAKE ONE TABLET BY MOUTH ONCE DAILY 30 tablet 5 09/21/2014 at Unknown  time  . metoprolol tartrate (LOPRESSOR) 25 MG tablet TAKE ONE-HALF TABLET BY MOUTH TWICE DAILY 30 tablet 5 09/21/2014 at 0800  . nitroGLYCERIN (NITROSTAT) 0.4 MG SL tablet Place 1 tablet (0.4 mg total) under the tongue every 5 (five) minutes as needed for chest pain. 25 tablet 11 unknown at Unknown time    Assessment: 54yo male admitted on 2/26 with CP. Now on heparin infusion for acute PE. Initial HL was at low end of goal range so rate was increased. HL now trended down to 0.12 despite rate increase. Per nurse, no interruptions in gtt reported overnight or this am. H/H wnl and stable with no reported significant s/s bleeding.    Goal of Therapy:  Heparin level 0.3-0.7 units/ml Monitor platelets by anticoagulation protocol: Yes   Plan:  Re-bolus with 2500 units x 1 Increase IV heparin drip to 1700 units/hr Check 6 hour HL Daily HL/CBC Monitor s/sx of bleeding F/u long-term anticoagulation  Korrey Schleicher K. Velva Harman, PharmD Clinical Pharmacist - Resident Pager: [phone] Pharmacy: [phone] 09/22/2014 9:27 AM

## 2014-09-22 NOTE — Progress Notes (Signed)
ANTICOAGULATION CONSULT NOTE - FOLLOW UP    HL = 0.79 (goal 0.3 - 0.7 units/mL) Heparin dosing weight =  75 kg   Assessment: 53 YOM with new PE to continue on IV heparin.  Heparin level now slightly supra-therapeutic.  No issue with infusion and lab drawn from opposite arm as heparin infusion.  It is drawn a little early.  No bleeding reported.   Plan: - Decrease heparin gtt to 1600 units/hr - Check 6 hr HL - F/U with oral anticoagulation when possible   Ege Muckey D. Mina Marble, PharmD, BCPS Pager:  [phone] 09/22/2014, 4:45 PM

## 2014-09-22 NOTE — Progress Notes (Signed)
[facility] PROGRESS NOTE  Acel Natzke Pitstick UYQ:[id number] DOB: 1961-01-18 DOA: 09/21/2014 PCP: [name], MD  Brief Summary  The 54 y/o male followed by Dr. Martinique for coronary disease, HTN and HLD. He is also a chronic smoker for 37 years.  He has a history of inferior MI. The patient has had acute coronary syndrome on 2 separate occasions (2007 and 2014) treated with PCI in both settings. He was evaluated in November 2015 with progressive chest pain at rest and with low-level exertion. He underwent cardiac catheterization demonstrating total occlusion of the right coronary artery within the previously stented segment and severe in-stent restenosis of the left circumflex. He was treated with PCI of the left circumflex using a drug-eluting stent. LV function was preserved with an ejection fraction of 50-55%. He was placed on ASA + Plavix . His last OV with Dr. Martinique was 06/28/14 and he was felt to be stable from a cardiac standpoint.   He presented with left shoulder pain without associated dyspnea, diaphoresis, lightheadness or dizziness. he also had left calf pain and swelling for three days prior to presentation.  Duplex was negative but CT of chest showed acute PE in the upper lobe branch of the right pulmonary artery and 2.3 x 1.6 cm spiculated density in left suprahilar region concerning for malignancy. Immediately superior to this, an 8 mm spiculated abnormality was also susicious for malignancy. Left hilar, aorticopulmonary window and right peritracheal adenopathy was consistent with metastatic disease.    Assessment/Plan  Left lung mass with lymphadenopathy -  Appreciate pulmonology assistance -  MRI negative for metastatic disease -  PET scan CANNOT be completed as an inpatient.  Patient MUST be discharged and follow up for PET scan as outpatient -  Will await pulmonology recommendations regarding timing of biopsy  Demand ischemia/strain from PE in setting of known CAD and  recent stent placement 05/2014 - troponin trending down (peak 1.38 on 2/26) -  Contdual anti-platelet therapy with ASA (lifelong) and plavix  -  ECHO pending - Statin therapy with aggressive LDL goal (ideally < 70) - Mandatory smoking cessation and counseling  Acute PE  Continue heparin while awaiting decision about endoscopy Will need lovenox for a/c in the setting of malignancy Will be HIGH bleed risk with ASA + PLAVIX + LOVENOX  HTN - Will follow BP trend and make adjustments accordingly - Beta-blockade with Metop 12.5mg  BID with titrations as necessary  Hyperlipidemia - Statin therapy with aggressive LDL goal (ideally <70)  Smoking cessation counseling done   Low grade fever, likely related to pulmonary embolism, no pneumonia on imaging from yesterday -  Check UA  Diet:  Healthy heart Access:  PIV IVF:  off Proph:  heparin  Code Status: full Family Communication: patient and family Disposition Plan: pending pulmonology making decision about biopsy.  Will need outpatient PET scan as soon as possible post discharge.     Consultants:  Cardiology  Pulmonology  Procedures:  CTa chest  MRI brain  Antibiotics:  none   HPI/Subjective:  Denies shortness of breath, chest pain, fevers, chills, cough, dysuria.      Objective: Filed Vitals:   09/22/14 0430 09/22/14 0753 09/22/14 1000 09/22/14 1211  BP: 136/81 136/81 124/76 138/84  Pulse: 81 73 87 71  Temp: 99.2 F (37.3 C) 99.2 F (37.3 C)  100.8 F (38.2 C)  TempSrc: Oral Oral  Oral  Resp: 18 20 18 20   Height:      Weight: 75.705 kg (166 lb  14.4 oz)     SpO2: 96% 98% 98% 99%    Intake/Output Summary (Last 24 hours) at 09/22/14 1248 Last data filed at 09/22/14 0900  Gross per 24 hour  Intake   1185 ml  Output    300 ml  Net    885 ml   Filed Weights   09/21/14 1630 09/22/14 0430  Weight: 75.297 kg (166 lb) 75.705 kg (166 lb 14.4 oz)    Exam:   General:  Thin male, No acute  distress  HEENT:  NCAT, MMM  Cardiovascular:  RRR, nl S1, S2 no mrg, 2+ pulses, warm extremities  Respiratory:  CTAB, no increased WOB  Abdomen:   NABS, soft, NT/ND  MSK:   Normal tone and bulk, trace bilateral LEE with some ecchymoses on the posterior left calf  Neuro:  Grossly intact  Data Reviewed: Basic Metabolic Panel:  Recent Labs Lab 09/21/14 1335 09/21/14 1918 09/22/14 0457  NA 136  --  136  K 3.8  --  4.0  CL 109  --  102  CO2 21  --  23  GLUCOSE 100*  --  103*  BUN 11  --  10  CREATININE 0.73  --  0.87  CALCIUM 8.2*  --  8.9  MG  --  1.9  --    Liver Function Tests:  Recent Labs Lab 09/21/14 1918 09/22/14 0457  AST 21 20  ALT 25 22  ALKPHOS 100 90  BILITOT 1.0 0.8  PROT 7.2 6.7  ALBUMIN 4.1 3.6   No results for input(s): LIPASE, AMYLASE in the last 168 hours. No results for input(s): AMMONIA in the last 168 hours. CBC:  Recent Labs Lab 09/21/14 1335 09/22/14 0457  WBC 13.5* 12.7*  NEUTROABS 9.5*  --   HGB 13.3 13.6  HCT 38.0* 40.0  MCV 93.4 94.1  PLT 204 240   Cardiac Enzymes:  Recent Labs Lab 09/21/14 1335 09/21/14 1918 09/21/14 2331 09/22/14 0457  TROPONINI 0.77* 1.38* 1.25* 1.08*   BNP (last 3 results) No results for input(s): BNP in the last 8760 hours.  ProBNP (last 3 results) No results for input(s): PROBNP in the last 8760 hours.  CBG: No results for input(s): GLUCAP in the last 168 hours.  Recent Results (from the past 240 hour(s))  MRSA PCR Screening     Status: None   Collection Time: 09/21/14  6:19 PM  Result Value Ref Range Status   MRSA by PCR NEGATIVE NEGATIVE Final    Comment:        The GeneXpert MRSA Assay (FDA approved for NASAL specimens only), is one component of a comprehensive MRSA colonization surveillance program. It is not intended to diagnose MRSA infection nor to guide or monitor treatment for MRSA infections.      Studies: Dg Chest 2 View  09/21/2014   CLINICAL DATA:  Chest pain.   Coronary disease  EXAM: CHEST  2 VIEW  COMPARISON:  06/24/2011  FINDINGS: Heart size normal. Right and left coronary artery stents. Negative for heart failure.  Prominent left hilum compared with prior studies which could represent mass or adenopathy. Further evaluation with CT is recommended.  Negative for pneumonia or effusion.  IMPRESSION: Prominent left hilum. Recommend CT chest with contrast to exclude mass lesion.  Right and left coronary artery stents.   Electronically Signed   By: Franchot Gallo M.D.   On: 09/21/2014 14:09   Ct Chest W Contrast  09/21/2014   CLINICAL DATA:  Left hilar  prominence.  EXAM: CT CHEST WITH CONTRAST  TECHNIQUE: Multidetector CT imaging of the chest was performed during intravenous contrast administration.  CONTRAST:  183mL OMNIPAQUE IOHEXOL 300 MG/ML  SOLN  COMPARISON:  Chest radiograph of same day.  FINDINGS: No pneumothorax or plural effusion is noted. Right lung is clear. 2.3 x 1.6 cm spiculated abnormality is noted in left suprahilar region concerning for malignancy. Mild biapical scarring is noted. 8 mm spiculated density is noted in left lung apex concerning for malignancy. Left hilar adenopathy is noted measure 3.1 x 1.9 cm. Adenopathy is also noted in the aortopulmonary window with the largest measuring 2.3 x 1.4 cm. Right peritracheal lymph node measuring 12 x 9 mm is noted.  There is no evidence of thoracic aortic dissection or aneurysm. Filling defect is seen in upper lobe branch of right pulmonary artery consistent with pulmonary embolus. Visualized portion of upper abdomen appears normal. No significant osseous abnormality is noted in the chest.  IMPRESSION: 2.3 x 1.6 cm spiculated density seen in left suprahilar region concerning for malignancy. Immediately superior to this, 8 mm spiculated abnormality is noted in left lung apex concerning for malignancy is well. Left hilar, aorticopulmonary window and right peritracheal adenopathy is noted consistent with  metastatic disease.  Acute pulmonary embolus seen in upper lobe branch of right pulmonary artery. Critical Value/emergent results were called by telephone at the time of interpretation on 09/21/2014 at 4:35 pm to Dr. Glendell Docker , who verbally acknowledged these results.   Electronically Signed   By: Marijo Conception, M.D.   On: 09/21/2014 16:36   Mr Jeri Cos TI Contrast  09/22/2014   CLINICAL DATA:  Chest pain and lung mass.  EXAM: MRI HEAD WITHOUT AND WITH CONTRAST  TECHNIQUE: Multiplanar, multiecho pulse sequences of the brain and surrounding structures were obtained without and with intravenous contrast.  CONTRAST:  49mL MULTIHANCE GADOBENATE DIMEGLUMINE 529 MG/ML IV SOLN  COMPARISON:  None.  FINDINGS: No acute infarct, hemorrhage, or mass lesion is present. The ventricles are of normal size. No significant extraaxial fluid collection is present.  Mild atrophy is present. There is no significant white matter disease.  Mild mucosal thickening is present in the maxillary sinuses bilaterally. There is diffuse opacification of ethmoid air cells, right greater than left. The frontal sinuses are not aerated. The sphenoid sinuses are clear. The mastoid air cells are clear.  The postcontrast images demonstrate no pathologic enhancement to suggest metastatic disease of the brain or meninges.  IMPRESSION: 1. Normal MRI appearance of the brain for age. No evidence for metastatic disease to the brain or meninges. 2. Mild sinus disease mostly involving the ethmoid air cells.   Electronically Signed   By: San Morelle M.D.   On: 09/22/2014 07:54    Scheduled Meds: . sodium chloride   Intravenous STAT  . aspirin  81 mg Oral Daily  . atorvastatin  80 mg Oral Daily  . clopidogrel  75 mg Oral Daily  . docusate sodium  100 mg Oral BID  . metoprolol tartrate  12.5 mg Oral BID  . sodium chloride  3 mL Intravenous Q12H   Continuous Infusions: . heparin 1,700 Units/hr (09/22/14 1002)    Active Problems:    Pulmonary embolism   PE (pulmonary embolism)    Time spent: 30 min    Dontrell Stuck, [facility] Pager [phone]. If 7PM-7AM, please contact night-coverage at [email or web address], password [facility] 09/22/2014, 12:48 PM  LOS: 1 day

## 2014-09-22 NOTE — Progress Notes (Signed)
ANTICOAGULATION CONSULT NOTE - follow up  Pharmacy Consult for Heparin Indication: pulmonary embolus  No Known Allergies  Patient Measurements: Height: 5\' 9"  (175.3 cm) Weight: 166 lb (75.297 kg) IBW/kg (Calculated) : 70.7 Heparin Dosing Weight: 75 kg  Vital Signs: Temp: 97.9 F (36.6 C) (02/26 2029) Temp Source: Oral (02/26 2029) BP: 143/73 mmHg (02/26 2029) Pulse Rate: 90 (02/26 2029)  Labs:  Recent Labs  09/21/14 1335 09/21/14 1918 09/21/14 2330  HGB 13.3  --   --   HCT 38.0*  --   --   PLT 204  --   --   HEPARINUNFRC  --   --  0.30  CREATININE 0.73  --   --   TROPONINI 0.77* 1.38*  --     Estimated Creatinine Clearance: 108 mL/min (by C-G formula based on Cr of 0.73).   Medical History: Past Medical History  Diagnosis Date  . HLD (hyperlipidemia)   . HTN (hypertension)     "took me off my BP pills" (06/20/2014)  . GERD (gastroesophageal reflux disease)     hx (06/20/2014)  . History of gout   . Coronary artery disease     a. s/p Taxus DES to CFX 8/06 in setting of NSTEMI;  b.  s/p Inf STEMI 06/21/11:  mRCA occluded>> PCI:  Overlapping Promus DES x 2 to RCA.;  c.  LHC (11/15):  pLAD 20-30%, oDx branches 50-60%, mCFX stent 95% ISR, dCFX stent ok, mRCA stent occluded with L-R collats, EF 50-55%, basal inf AK, mid Inf HK >> PCI:  3.25 x 23 mm Xience DES to [place]  . Myocardial infarction 02/2005; 05/2011  . Pulmonary embolism 09/21/2014    Medications:  Prescriptions prior to admission  Medication Sig Dispense Refill Last Dose  . aspirin 81 MG tablet Take 81 mg by mouth daily.   09/21/2014 at Unknown time  . atorvastatin (LIPITOR) 80 MG tablet TAKE ONE TABLET BY MOUTH ONCE DAILY 30 tablet 5 09/21/2014 at Unknown time  . clopidogrel (PLAVIX) 75 MG tablet TAKE ONE TABLET BY MOUTH ONCE DAILY 30 tablet 5 09/21/2014 at Unknown time  . metoprolol tartrate (LOPRESSOR) 25 MG tablet TAKE ONE-HALF TABLET BY MOUTH TWICE DAILY 30 tablet 5 09/21/2014 at 0800  . nitroGLYCERIN  (NITROSTAT) 0.4 MG SL tablet Place 1 tablet (0.4 mg total) under the tongue every 5 (five) minutes as needed for chest pain. 25 tablet 11 unknown at Unknown time   Scheduled:  Infusions:   Assessment: 1st heparin level is 0.3, therapeutic. Level is right at the low end of therapeutic goal.  Will increase rate to keep level within goal for this 54yo male now on IV heparin infusion for acute PE. No bleeding reported. [name]  History of CAD, HTN, and HLD presented with chest pain. Acute PE seen in upper lobe branch of R pulmonary artery on CT chest.  Admit CBC wnl, sCr 0.7, Trop 0.8.> 1.38  Goal of Therapy:  Heparin level 0.3-0.7 units/ml Monitor platelets by anticoagulation protocol: Yes   Plan:  Increase IV heparin drip to 1400 units/hr Check heparin level in AM Monitor s/sx of bleeding F/u long-term anticoag  Nicole Cella, RPh Clinical Pharmacist Pager: [phone] .09/22/2014,12:22 AM

## 2014-09-23 DIAGNOSIS — M7989 Other specified soft tissue disorders: Secondary | ICD-10-CM

## 2014-09-23 DIAGNOSIS — I248 Other forms of acute ischemic heart disease: Secondary | ICD-10-CM

## 2014-09-23 DIAGNOSIS — R918 Other nonspecific abnormal finding of lung field: Secondary | ICD-10-CM

## 2014-09-23 LAB — CBC
HCT: 40.1 % (ref 39.0–52.0)
Hemoglobin: 13.8 g/dL (ref 13.0–17.0)
MCH: 31.9 pg (ref 26.0–34.0)
MCHC: 34.4 g/dL (ref 30.0–36.0)
MCV: 92.8 fL (ref 78.0–100.0)
Platelets: 278 10*3/uL (ref 150–400)
RBC: 4.32 MIL/uL (ref 4.22–5.81)
RDW: 12.2 % (ref 11.5–15.5)
WBC: 11.9 10*3/uL — AB (ref 4.0–10.5)

## 2014-09-23 LAB — HEPARIN LEVEL (UNFRACTIONATED): HEPARIN UNFRACTIONATED: 0.38 [IU]/mL (ref 0.30–0.70)

## 2014-09-23 LAB — TROPONIN I: TROPONIN I: 1.25 ng/mL — AB (ref <0.031)

## 2014-09-23 MED ORDER — ENOXAPARIN (LOVENOX) PATIENT EDUCATION KIT
PACK | Freq: Once | Status: AC
  Administered 2014-09-23: 11:00:00
  Filled 2014-09-23: qty 1

## 2014-09-23 MED ORDER — ENOXAPARIN SODIUM 80 MG/0.8ML ~~LOC~~ SOLN
70.0000 mg | Freq: Two times a day (BID) | SUBCUTANEOUS | Status: DC
  Administered 2014-09-23 (×2): 70 mg via SUBCUTANEOUS
  Filled 2014-09-23 (×2): qty 0.8

## 2014-09-23 NOTE — Progress Notes (Signed)
Patient was able to verbalize the process and administer evening Lovenox injection to himself without any complications. Will continue to monitor.

## 2014-09-23 NOTE — Progress Notes (Signed)
ANTICOAGULATION CONSULT NOTE - Follow Up Consult  Pharmacy Consult for heparin Indication: pulmonary embolus  Labs:  Recent Labs  09/21/14 1335 09/21/14 1918  09/21/14 2331 09/22/14 0457 09/22/14 0630 09/22/14 1543 09/22/14 2315  HGB 13.3  --   --   --  13.6  --   --   --   HCT 38.0*  --   --   --  40.0  --   --   --   PLT 204  --   --   --  240  --   --   --   HEPARINUNFRC  --   --   < >  --   --  0.12* 0.79* 0.71*  CREATININE 0.73  --   --   --  0.87  --   --   --   TROPONINI 0.77* 1.38*  --  1.25* 1.08*  --   --   --   < > = values in this interval not displayed.   Assessment: 54yo male remains slightly supratherapeutic on heparin after rate decrease.  Goal of Therapy:  Heparin level 0.3-0.7 units/ml   Plan:  Will decrease heparin gtt slightly to 1500 units/hr and check level in with am labs.  Wynona Neat, PharmD, BCPS  09/23/2014,12:07 AM

## 2014-09-23 NOTE — Discharge Summary (Signed)
Physician Discharge Summary  [name] HBZ:[id number] DOB: 05-29-1961 DOA: 09/21/2014  PCP: [name], MD  Admit date: 09/21/2014 Discharge date: 09/24/2014  Recommendations for Outpatient Follow-up:  1. F/u primary care in 1 week for screening for bleeding problems, renewal of prescription for lovenox injections 2. F/u pulmonology in 2 weeks to schedule endobronchial [place] and biopsy 3. PET scan on Thursday 4. F/u cardiology, Dr. Martinique in 2-3 weeks because patient will need guidance about his aspirin and plavix prior to endobronchial biopsy  Discharge Diagnoses:  Principal Problem:   PE (pulmonary embolism) Active Problems:   Coronary artery disease   Hypertension   Tobacco abuse   Pulmonary embolism   Lung mass   Swelling of lower extremity   Demand ischemia   Discharge Condition: stable, improved  Diet recommendation: healthy heart   Wt Readings from Last 3 Encounters:  09/24/14 71.305 kg (157 lb 3.2 oz)  06/28/14 72.176 kg (159 lb 1.9 oz)  06/21/14 73.9 kg (162 lb 14.7 oz)    History of present illness:   The 54 y/o male followed by Dr. Martinique for coronary disease, HTN and HLD. He is also a chronic smoker for 37 years. He has a history of inferior MI. The patient has had acute coronary syndrome on 2 separate occasions (2007 and 2014) treated with PCI in both settings. He was evaluated in November 2015 with progressive chest pain at rest and with low-level exertion. He underwent cardiac catheterization demonstrating total occlusion of the right coronary artery within the previously stented segment and severe in-stent restenosis of the left circumflex. He was treated with PCI of the left circumflex using a drug-eluting stent. LV function was preserved with an ejection fraction of 50-55%. He was placed on ASA + Plavix . His last OV with Dr. Martinique was 06/28/14 and he was felt to be stable from a cardiac standpoint.   He presented with left shoulder pain without  associated dyspnea, diaphoresis, lightheadness or dizziness. He also had left calf pain and swelling for three days prior to presentation. Duplex was negative but CT of chest showed acute PE in the upper lobe branch of the right pulmonary artery and 2.3 x 1.6 cm spiculated density in left suprahilar region concerning for malignancy. Immediately superior to this, an 8 mm spiculated abnormality was also susicious for malignancy. Left hilar, aorticopulmonary window and right peritracheal adenopathy was consistent with metastatic disease.   Hospital Course:   Left lung mass with lymphadenopathy - Appreciate pulmonology assistance - MRI negative for metastatic disease - PET scan scheduled for Thursday, results to be reviewed with pulmonology at appointment in two weeks - Biopsy 4 weeks from initiation of anticoagulation:  Earliest would be 10/18/14 - Patient to follow-up with pulmonology in about 2 weeks to schedule the bronchoscopy and biopsy  Acute PE  Transition to lovenox for a/c in the setting of malignancy Will be HIGH bleed risk with ASA + PLAVIX + LOVENOX, patient aware  Demand ischemia/strain from PE in setting of known CAD and recent stent placement 05/2014 -  Troponin trending down (peak 1.38 on 2/26) - ECHO: Normal systolic function with ejection fraction of 10-17%, grade 1 diastolic dysfunction, peak PA pressure 35 mmHg, right ventricle mildly dilated with normal wall thickness -  Continue high dose statin, aspirin, plavix -  Counseled about smoking cessation  HTN, blood pressure generally well controlled, continued home medications  Hyperlipidemia, on high dose statin  Low grade fever, likely related to pulmonary embolism.  CXR  and UA neg for signs of infection.    Lower extremity swelling - Venous duplex: neg - TED hose  Consultants:  Cardiology  Pulmonology  Procedures:  CTa chest  MRI brain  Echo  Antibiotics:  none  Discharge Exam: Filed  Vitals:   09/24/14 0745  BP: 143/94  Pulse: 92  Temp: 98 F (36.7 C)  Resp: 15   Filed Vitals:   09/23/14 2004 09/24/14 0000 09/24/14 0400 09/24/14 0745  BP: 129/87 126/85 123/82 143/94  Pulse: 88 74 89 92  Temp: 98.9 F (37.2 C) 98.4 F (36.9 C) 98.5 F (36.9 C) 98 F (36.7 C)  TempSrc: Oral Oral Oral Oral  Resp: 20 18 20 15   Height:      Weight:   71.305 kg (157 lb 3.2 oz)   SpO2: 98% 98% 100% 98%    General: Thin male, No acute distress  HEENT: NCAT, MMM  Cardiovascular: RRR, nl S1, S2 no mrg, 2+ pulses, warm extremities  Respiratory: CTAB, no increased WOB  MSK: Normal tone and bulk, trace bilateral LEE with some ecchymoses on the posterior left calf  Discharge Instructions      Discharge Instructions    Call MD for:  difficulty breathing, headache or visual disturbances    Complete by:  As directed      Call MD for:  extreme fatigue    Complete by:  As directed      Call MD for:  hives    Complete by:  As directed      Call MD for:  persistant dizziness or light-headedness    Complete by:  As directed      Call MD for:  persistant nausea and vomiting    Complete by:  As directed      Call MD for:  severe uncontrolled pain    Complete by:  As directed      Call MD for:  temperature >100.4    Complete by:  As directed      Diet - low sodium heart healthy    Complete by:  As directed      Discharge instructions    Complete by:  As directed   You were hospitalized with a pulmonary embolism that was probably caused by some underlying cancer.  Please take lovenox injections once a day to help get rid of your blood clot.  On Thursday, please arrive no later than your scheduled appointment time at [facility] Radiology Department for your PET scan.  You will follow up with Pulmonology to review your scan results and schedule a biopsy which will take place about a month after starting your lovenox.     Increase activity slowly    Complete by:  As directed              Medication List    TAKE these medications        aspirin 81 MG tablet  Take 81 mg by mouth daily.     atorvastatin 80 MG tablet  Commonly known as:  LIPITOR  TAKE ONE TABLET BY MOUTH ONCE DAILY     clopidogrel 75 MG tablet  Commonly known as:  PLAVIX  TAKE ONE TABLET BY MOUTH ONCE DAILY     enoxaparin 100 MG/ML injection  Commonly known as:  LOVENOX  Inject 1 mL (100 mg total) into the skin daily.     metoprolol tartrate 25 MG tablet  Commonly known as:  LOPRESSOR  TAKE ONE-HALF TABLET BY MOUTH  TWICE DAILY     nitroGLYCERIN 0.4 MG SL tablet  Commonly known as:  NITROSTAT  Place 1 tablet (0.4 mg total) under the tongue every 5 (five) minutes as needed for chest pain.       Follow-up Information    Follow up with [name], MD In 1 week.   Specialty:  Family Medicine   Contact information:   [address], [place] [address] [phone]       Follow up with Christinia Gully, MD. Schedule an appointment as soon as possible for a visit on 10/15/2014.   Specialty:  Pulmonary Disease   Why:  3PM   Contact information:   [address] [place] [address] [phone]       Follow up with PET scan  On 09/27/2014.   Why:  8:45AM, do not eat or drink anything for 6 hours prior to procedure   Contact information:   [facility] [address], [place]  [address] [phone]       The results of significant diagnostics from this hospitalization (including imaging, microbiology, ancillary and laboratory) are listed below for reference.    Significant Diagnostic Studies: Dg Chest 2 View  09/21/2014   CLINICAL DATA:  Chest pain.  Coronary disease  EXAM: CHEST  2 VIEW  COMPARISON:  06/24/2011  FINDINGS: Heart size normal. Right and left coronary artery stents. Negative for heart failure.  Prominent left hilum compared with prior studies which could represent mass or adenopathy. Further evaluation with CT is  recommended.  Negative for pneumonia or effusion.  IMPRESSION: Prominent left hilum. Recommend CT chest with contrast to exclude mass lesion.  Right and left coronary artery stents.   Electronically Signed   By: Franchot Gallo M.D.   On: 09/21/2014 14:09   Ct Chest W Contrast  09/21/2014   CLINICAL DATA:  Left hilar prominence.  EXAM: CT CHEST WITH CONTRAST  TECHNIQUE: Multidetector CT imaging of the chest was performed during intravenous contrast administration.  CONTRAST:  15mL OMNIPAQUE IOHEXOL 300 MG/ML  SOLN  COMPARISON:  Chest radiograph of same day.  FINDINGS: No pneumothorax or plural effusion is noted. Right lung is clear. 2.3 x 1.6 cm spiculated abnormality is noted in left suprahilar region concerning for malignancy. Mild biapical scarring is noted. 8 mm spiculated density is noted in left lung apex concerning for malignancy. Left hilar adenopathy is noted measure 3.1 x 1.9 cm. Adenopathy is also noted in the aortopulmonary window with the largest measuring 2.3 x 1.4 cm. Right peritracheal lymph node measuring 12 x 9 mm is noted.  There is no evidence of thoracic aortic dissection or aneurysm. Filling defect is seen in upper lobe branch of right pulmonary artery consistent with pulmonary embolus. Visualized portion of upper abdomen appears normal. No significant osseous abnormality is noted in the chest.  IMPRESSION: 2.3 x 1.6 cm spiculated density seen in left suprahilar region concerning for malignancy. Immediately superior to this, 8 mm spiculated abnormality is noted in left lung apex concerning for malignancy is well. Left hilar, aorticopulmonary window and right peritracheal adenopathy is noted consistent with metastatic disease.  Acute pulmonary embolus seen in upper lobe branch of right pulmonary artery. Critical Value/emergent results were called by telephone at the time of interpretation on 09/21/2014 at 4:35 pm to Dr. Glendell Docker , who verbally acknowledged these results.    Electronically Signed   By: Marijo Conception, M.D.   On: 09/21/2014 16:36  Mr Jeri Cos Wo Contrast  09/22/2014   CLINICAL DATA:  Chest pain and lung mass.  EXAM: MRI HEAD WITHOUT AND WITH CONTRAST  TECHNIQUE: Multiplanar, multiecho pulse sequences of the brain and surrounding structures were obtained without and with intravenous contrast.  CONTRAST:  22mL MULTIHANCE GADOBENATE DIMEGLUMINE 529 MG/ML IV SOLN  COMPARISON:  None.  FINDINGS: No acute infarct, hemorrhage, or mass lesion is present. The ventricles are of normal size. No significant extraaxial fluid collection is present.  Mild atrophy is present. There is no significant white matter disease.  Mild mucosal thickening is present in the maxillary sinuses bilaterally. There is diffuse opacification of ethmoid air cells, right greater than left. The frontal sinuses are not aerated. The sphenoid sinuses are clear. The mastoid air cells are clear.  The postcontrast images demonstrate no pathologic enhancement to suggest metastatic disease of the brain or meninges.  IMPRESSION: 1. Normal MRI appearance of the brain for age. No evidence for metastatic disease to the brain or meninges. 2. Mild sinus disease mostly involving the ethmoid air cells.   Electronically Signed   By: San Morelle M.D.   On: 09/22/2014 07:54    Microbiology: Recent Results (from the past 240 hour(s))  MRSA PCR Screening     Status: None   Collection Time: 09/21/14  6:19 PM  Result Value Ref Range Status   MRSA by PCR NEGATIVE NEGATIVE Final    Comment:        The GeneXpert MRSA Assay (FDA approved for NASAL specimens only), is one component of a comprehensive MRSA colonization surveillance program. It is not intended to diagnose MRSA infection nor to guide or monitor treatment for MRSA infections.   Culture, Urine     Status: None   Collection Time: 09/22/14  4:47 PM  Result Value Ref Range Status   Specimen Description URINE, CATHETERIZED  Final   Special  Requests NONE  Final   Colony Count NO GROWTH Performed at [facility]   Final   Culture NO GROWTH Performed at [facility]   Final   Report Status 09/24/2014 FINAL  Final     Labs: Basic Metabolic Panel:  Recent Labs Lab 09/21/14 1335 09/21/14 1918 09/22/14 0457  NA 136  --  136  K 3.8  --  4.0  CL 109  --  102  CO2 21  --  23  GLUCOSE 100*  --  103*  BUN 11  --  10  CREATININE 0.73  --  0.87  CALCIUM 8.2*  --  8.9  MG  --  1.9  --    Liver Function Tests:  Recent Labs Lab 09/21/14 1918 09/22/14 0457  AST 21 20  ALT 25 22  ALKPHOS 100 90  BILITOT 1.0 0.8  PROT 7.2 6.7  ALBUMIN 4.1 3.6   No results for input(s): LIPASE, AMYLASE in the last 168 hours. No results for input(s): AMMONIA in the last 168 hours. CBC:  Recent Labs Lab 09/21/14 1335 09/22/14 0457 09/23/14 0616 09/24/14 0349  WBC 13.5* 12.7* 11.9* 12.4*  NEUTROABS 9.5*  --   --   --   HGB 13.3 13.6 13.8 14.1  HCT 38.0* 40.0 40.1 40.4  MCV 93.4 94.1 92.8 92.0  PLT 204 240 278 298   Cardiac Enzymes:  Recent Labs Lab 09/21/14 1335 09/21/14 1918 09/21/14 2331 09/22/14 0457  TROPONINI 0.77* 1.38* 1.25* 1.08*   BNP: BNP (last 3 results) No results for input(s): BNP in the last  8760 hours.  ProBNP (last 3 results) No results for input(s): PROBNP in the last 8760 hours.  CBG: No results for input(s): GLUCAP in the last 168 hours.  Time coordinating discharge: 35 minutes  Signed:  Jerlisa Diliberto  [facility] 09/24/2014, 4:19 PM

## 2014-09-23 NOTE — Progress Notes (Signed)
Utilization review completed.  

## 2014-09-23 NOTE — Progress Notes (Addendum)
ANTICOAGULATION CONSULT NOTE - follow up  Pharmacy Consult for Lovenox Indication: pulmonary embolus  No Known Allergies  Patient Measurements: Height: 5\' 9"  (175.3 cm) Weight: 159 lb 4.8 oz (72.258 kg) IBW/kg (Calculated) : 70.7  Vital Signs: Temp: 99.1 F (37.3 C) (02/28 0727) Temp Source: Oral (02/28 0727) BP: 127/77 mmHg (02/28 0905) Pulse Rate: 84 (02/28 0905)  Labs:  Recent Labs  09/21/14 1335 09/21/14 1918  09/21/14 2331 09/22/14 0457  09/22/14 1543 09/22/14 2315 09/23/14 0616  HGB 13.3  --   --   --  13.6  --   --   --  13.8  HCT 38.0*  --   --   --  40.0  --   --   --  40.1  PLT 204  --   --   --  240  --   --   --  278  HEPARINUNFRC  --   --   < >  --   --   < > 0.79* 0.71* 0.38  CREATININE 0.73  --   --   --  0.87  --   --   --   --   TROPONINI 0.77* 1.38*  --  1.25* 1.08*  --   --   --   --   < > = values in this interval not displayed.  Estimated Creatinine Clearance: 99.3 mL/min (by C-G formula based on Cr of 0.87).   Medical History: Past Medical History  Diagnosis Date  . HLD (hyperlipidemia)   . HTN (hypertension)     "took me off my BP pills" (06/20/2014)  . GERD (gastroesophageal reflux disease)     hx (06/20/2014)  . History of gout   . Coronary artery disease     a. s/p Taxus DES to CFX 8/06 in setting of NSTEMI;  b.  s/p Inf STEMI 06/21/11:  mRCA occluded>> PCI:  Overlapping Promus DES x 2 to RCA.;  c.  LHC (11/15):  pLAD 20-30%, oDx branches 50-60%, mCFX stent 95% ISR, dCFX stent ok, mRCA stent occluded with L-R collats, EF 50-55%, basal inf AK, mid Inf HK >> PCI:  3.25 x 23 mm Xience DES to [place]  . Myocardial infarction 02/2005; 05/2011  . Pulmonary embolism 09/21/2014    Medications:  Prescriptions prior to admission  Medication Sig Dispense Refill Last Dose  . aspirin 81 MG tablet Take 81 mg by mouth daily.   09/21/2014 at Unknown time  . atorvastatin (LIPITOR) 80 MG tablet TAKE ONE TABLET BY MOUTH ONCE DAILY 30 tablet 5 09/21/2014 at  Unknown time  . clopidogrel (PLAVIX) 75 MG tablet TAKE ONE TABLET BY MOUTH ONCE DAILY 30 tablet 5 09/21/2014 at Unknown time  . metoprolol tartrate (LOPRESSOR) 25 MG tablet TAKE ONE-HALF TABLET BY MOUTH TWICE DAILY 30 tablet 5 09/21/2014 at 0800  . nitroGLYCERIN (NITROSTAT) 0.4 MG SL tablet Place 1 tablet (0.4 mg total) under the tongue every 5 (five) minutes as needed for chest pain. 25 tablet 11 unknown at Unknown time    Assessment: 54yo male admitted on 2/26 with CP. Pharmacy consulted to dose heparin infusion for acute PE. HL is now therapeutic but now to transition to therapeutic lovenox. H/H wnl and stable with no reported significant s/s bleeding. Scr remains stable at 0.87 (CrCl >100 ml/min), current weight 72 kg.   Goal of Therapy:  Monitor platelets by anticoagulation protocol: Yes   Plan:  Discontinue heparin gtt Initiate lovenox 70 mg SQ q12h about an hour after  heparin d/c Monitor s/sx of bleeding  Abubakar Crispo K. Velva Harman, PharmD Clinical Pharmacist - Resident Pager: [phone] Pharmacy: [phone] 09/23/2014 9:46 AM

## 2014-09-23 NOTE — Progress Notes (Signed)
[facility] PROGRESS NOTE  [name] WUX:[id number] DOB: 12/19/1960 DOA: 09/21/2014 PCP: [name], MD  Brief Summary  The 54 y/o male followed by Dr. Martinique for coronary disease, HTN and HLD. He is also a chronic smoker for 37 years.  He has a history of inferior MI. The patient has had acute coronary syndrome on 2 separate occasions (2007 and 2014) treated with PCI in both settings. He was evaluated in November 2015 with progressive chest pain at rest and with low-level exertion. He underwent cardiac catheterization demonstrating total occlusion of the right coronary artery within the previously stented segment and severe in-stent restenosis of the left circumflex. He was treated with PCI of the left circumflex using a drug-eluting stent. LV function was preserved with an ejection fraction of 50-55%. He was placed on ASA + Plavix . His last OV with Dr. Martinique was 06/28/14 and he was felt to be stable from a cardiac standpoint.   He presented with left shoulder pain without associated dyspnea, diaphoresis, lightheadness or dizziness. he also had left calf pain and swelling for three days prior to presentation.  Duplex was negative but CT of chest showed acute PE in the upper lobe branch of the right pulmonary artery and 2.3 x 1.6 cm spiculated density in left suprahilar region concerning for malignancy. Immediately superior to this, an 8 mm spiculated abnormality was also susicious for malignancy. Left hilar, aorticopulmonary window and right peritracheal adenopathy was consistent with metastatic disease.    Assessment/Plan  Left lung mass with lymphadenopathy -  Appreciate pulmonology assistance -  MRI negative for metastatic disease -  PET scan CANNOT be completed as an inpatient.  Patient MUST be discharged and follow up for PET scan as outpatient - will call to arrange appointment time and date tomorrow when offices open -  Plan for biopsy several weeks after starting  anticoagulation -  Patient to follow-up with pulmonology in about 2 weeks to schedule the bronchoscopy and biopsy  Demand ischemia/strain from PE in setting of known CAD and recent stent placement 05/2014 - troponin trending down (peak 1.38 on 2/26) -  Contdual anti-platelet therapy with ASA (lifelong) and plavix  -  ECHO:  Normal systolic function with ejection fraction of 25-36%, grade 1 diastolic dysfunction, peak PA pressure 35 mmHg, right ventricle mildly dilated with normal wall thickness - Statin therapy with aggressive LDL goal (ideally < 70) - Mandatory smoking cessation and counseling  Acute PE  Transition to lovenox for a/c in the setting of malignancy Will be HIGH bleed risk with ASA + PLAVIX + LOVENOX Case management to find out how much Lovenox will cost and I will assist with prior authorization if needed  HTN - Will follow BP trend and make adjustments accordingly - Beta-blockade with Metop 12.5mg  BID with titrations as necessary  Hyperlipidemia - Statin therapy with aggressive LDL goal (ideally <70)  Smoking cessation counseling done   Low grade fever, likely related to pulmonary embolism, no pneumonia on imaging from yesterday -  UA negative  Lower extremity swelling -  Venous duplex:  neg -  TED hose  Diet:  Healthy heart Access:  PIV IVF:  off Proph:  heparin  Code Status: full Family Communication: patient and family Disposition Plan:  Home tomorrow after he has demonstrated correct form with Lovenox injections, we have verified the cost of his medication, and appointments for close follow-up have been scheduled  Consultants:  Cardiology  Pulmonology  Procedures:  CTa chest  MRI  brain  Echo  Antibiotics:  none   HPI/Subjective:  Denies shortness of breath, chest pain, fevers, chills, cough, dysuria.    Objective: Filed Vitals:   09/22/14 2000 09/23/14 0000 09/23/14 0413 09/23/14 0727  BP: 143/75 142/83 131/74 129/84  Pulse: 73  73 86 77  Temp: 98.6 F (37 C) 98.3 F (36.8 C) 98.3 F (36.8 C) 99.1 F (37.3 C)  TempSrc: Oral Oral Oral Oral  Resp: 18 18 18 16   Height:      Weight:   72.258 kg (159 lb 4.8 oz)   SpO2: 97% 98% 99% 98%    Intake/Output Summary (Last 24 hours) at 09/23/14 0857 Last data filed at 09/23/14 6644  Gross per 24 hour  Intake   1440 ml  Output      0 ml  Net   1440 ml   Filed Weights   09/21/14 1630 09/22/14 0430 09/23/14 0413  Weight: 75.297 kg (166 lb) 75.705 kg (166 lb 14.4 oz) 72.258 kg (159 lb 4.8 oz)    Exam:   General:  Thin male, No acute distress  HEENT:  NCAT, MMM  Cardiovascular:  RRR, nl S1, S2 no mrg, 2+ pulses, warm extremities  Respiratory:  CTAB, no increased WOB  Abdomen:   NABS, soft, NT/ND  MSK:   Normal tone and bulk, trace bilateral LEE with some ecchymoses on the posterior left calf  Neuro:  Grossly intact  Data Reviewed: Basic Metabolic Panel:  Recent Labs Lab 09/21/14 1335 09/21/14 1918 09/22/14 0457  NA 136  --  136  K 3.8  --  4.0  CL 109  --  102  CO2 21  --  23  GLUCOSE 100*  --  103*  BUN 11  --  10  CREATININE 0.73  --  0.87  CALCIUM 8.2*  --  8.9  MG  --  1.9  --    Liver Function Tests:  Recent Labs Lab 09/21/14 1918 09/22/14 0457  AST 21 20  ALT 25 22  ALKPHOS 100 90  BILITOT 1.0 0.8  PROT 7.2 6.7  ALBUMIN 4.1 3.6   No results for input(s): LIPASE, AMYLASE in the last 168 hours. No results for input(s): AMMONIA in the last 168 hours. CBC:  Recent Labs Lab 09/21/14 1335 09/22/14 0457 09/23/14 0616  WBC 13.5* 12.7* 11.9*  NEUTROABS 9.5*  --   --   HGB 13.3 13.6 13.8  HCT 38.0* 40.0 40.1  MCV 93.4 94.1 92.8  PLT 204 240 278   Cardiac Enzymes:  Recent Labs Lab 09/21/14 1335 09/21/14 1918 09/21/14 2331 09/22/14 0457  TROPONINI 0.77* 1.38* 1.25* 1.08*   BNP (last 3 results) No results for input(s): BNP in the last 8760 hours.  ProBNP (last 3 results) No results for input(s): PROBNP in the last  8760 hours.  CBG: No results for input(s): GLUCAP in the last 168 hours.  Recent Results (from the past 240 hour(s))  MRSA PCR Screening     Status: None   Collection Time: 09/21/14  6:19 PM  Result Value Ref Range Status   MRSA by PCR NEGATIVE NEGATIVE Final    Comment:        The GeneXpert MRSA Assay (FDA approved for NASAL specimens only), is one component of a comprehensive MRSA colonization surveillance program. It is not intended to diagnose MRSA infection nor to guide or monitor treatment for MRSA infections.      Studies: Dg Chest 2 View  09/21/2014   CLINICAL  DATA:  Chest pain.  Coronary disease  EXAM: CHEST  2 VIEW  COMPARISON:  06/24/2011  FINDINGS: Heart size normal. Right and left coronary artery stents. Negative for heart failure.  Prominent left hilum compared with prior studies which could represent mass or adenopathy. Further evaluation with CT is recommended.  Negative for pneumonia or effusion.  IMPRESSION: Prominent left hilum. Recommend CT chest with contrast to exclude mass lesion.  Right and left coronary artery stents.   Electronically Signed   By: Franchot Gallo M.D.   On: 09/21/2014 14:09   Ct Chest W Contrast  09/21/2014   CLINICAL DATA:  Left hilar prominence.  EXAM: CT CHEST WITH CONTRAST  TECHNIQUE: Multidetector CT imaging of the chest was performed during intravenous contrast administration.  CONTRAST:  121mL OMNIPAQUE IOHEXOL 300 MG/ML  SOLN  COMPARISON:  Chest radiograph of same day.  FINDINGS: No pneumothorax or plural effusion is noted. Right lung is clear. 2.3 x 1.6 cm spiculated abnormality is noted in left suprahilar region concerning for malignancy. Mild biapical scarring is noted. 8 mm spiculated density is noted in left lung apex concerning for malignancy. Left hilar adenopathy is noted measure 3.1 x 1.9 cm. Adenopathy is also noted in the aortopulmonary window with the largest measuring 2.3 x 1.4 cm. Right peritracheal lymph node measuring 12 x 9  mm is noted.  There is no evidence of thoracic aortic dissection or aneurysm. Filling defect is seen in upper lobe branch of right pulmonary artery consistent with pulmonary embolus. Visualized portion of upper abdomen appears normal. No significant osseous abnormality is noted in the chest.  IMPRESSION: 2.3 x 1.6 cm spiculated density seen in left suprahilar region concerning for malignancy. Immediately superior to this, 8 mm spiculated abnormality is noted in left lung apex concerning for malignancy is well. Left hilar, aorticopulmonary window and right peritracheal adenopathy is noted consistent with metastatic disease.  Acute pulmonary embolus seen in upper lobe branch of right pulmonary artery. Critical Value/emergent results were called by telephone at the time of interpretation on 09/21/2014 at 4:35 pm to Dr. Glendell Docker , who verbally acknowledged these results.   Electronically Signed   By: Marijo Conception, M.D.   On: 09/21/2014 16:36   Mr Jeri Cos UE Contrast  09/22/2014   CLINICAL DATA:  Chest pain and lung mass.  EXAM: MRI HEAD WITHOUT AND WITH CONTRAST  TECHNIQUE: Multiplanar, multiecho pulse sequences of the brain and surrounding structures were obtained without and with intravenous contrast.  CONTRAST:  10mL MULTIHANCE GADOBENATE DIMEGLUMINE 529 MG/ML IV SOLN  COMPARISON:  None.  FINDINGS: No acute infarct, hemorrhage, or mass lesion is present. The ventricles are of normal size. No significant extraaxial fluid collection is present.  Mild atrophy is present. There is no significant white matter disease.  Mild mucosal thickening is present in the maxillary sinuses bilaterally. There is diffuse opacification of ethmoid air cells, right greater than left. The frontal sinuses are not aerated. The sphenoid sinuses are clear. The mastoid air cells are clear.  The postcontrast images demonstrate no pathologic enhancement to suggest metastatic disease of the brain or meninges.  IMPRESSION: 1. Normal  MRI appearance of the brain for age. No evidence for metastatic disease to the brain or meninges. 2. Mild sinus disease mostly involving the ethmoid air cells.   Electronically Signed   By: San Morelle M.D.   On: 09/22/2014 07:54    Scheduled Meds: . aspirin  81 mg Oral Daily  . atorvastatin  80 mg Oral Daily  . clopidogrel  75 mg Oral Daily  . docusate sodium  100 mg Oral BID  . metoprolol tartrate  12.5 mg Oral BID  . sodium chloride  3 mL Intravenous Q12H   Continuous Infusions: . heparin 1,500 Units/hr (09/23/14 0056)    Active Problems:   Pulmonary embolism   PE (pulmonary embolism)    Time spent: 30 min    Alexias Margerum, [facility] Pager [phone]. If 7PM-7AM, please contact night-coverage at [email or web address], password [facility] 09/23/2014, 8:57 AM  LOS: 2 days

## 2014-09-24 DIAGNOSIS — I248 Other forms of acute ischemic heart disease: Secondary | ICD-10-CM

## 2014-09-24 LAB — URINE CULTURE
CULTURE: NO GROWTH
Colony Count: NO GROWTH

## 2014-09-24 LAB — CBC
HCT: 40.4 % (ref 39.0–52.0)
HEMOGLOBIN: 14.1 g/dL (ref 13.0–17.0)
MCH: 32.1 pg (ref 26.0–34.0)
MCHC: 34.9 g/dL (ref 30.0–36.0)
MCV: 92 fL (ref 78.0–100.0)
Platelets: 298 10*3/uL (ref 150–400)
RBC: 4.39 MIL/uL (ref 4.22–5.81)
RDW: 12.2 % (ref 11.5–15.5)
WBC: 12.4 10*3/uL — AB (ref 4.0–10.5)

## 2014-09-24 MED ORDER — ENOXAPARIN SODIUM 100 MG/ML ~~LOC~~ SOLN: 100.0000 mg | SUBCUTANEOUS | Status: DC

## 2014-09-24 MED ORDER — ENOXAPARIN SODIUM 100 MG/ML ~~LOC~~ SOLN
100.0000 mg | SUBCUTANEOUS | Status: DC
  Administered 2014-09-24: 100 mg via SUBCUTANEOUS
  Filled 2014-09-24: qty 1

## 2014-09-24 NOTE — Care Management Note (Addendum)
    Page 1 of 1   09/24/2014     11:07:30 AM CARE MANAGEMENT NOTE 09/24/2014  Patient:  KENSHAWN, MACIOLEK   Account Number:  [id number]  Date Initiated:  09/24/2014  Documentation initiated by:  GRAVES-BIGELOW,Izac Faulkenberry  Subjective/Objective Assessment:   Pt admitted for Pulmonary Embolism. Pt is from home with wife.     Action/Plan:   Benefits check in process for lovenox injections. Pt uses [facility] on [address]. Will make pt aware of cost once completed.   Anticipated DC Date:  09/24/2014   Anticipated DC Plan:  [place]  CM consult  Medication Assistance      Choice offered to / List presented to:             Status of service:  Completed, signed off Medicare Important Message given?  NO (If response is "NO", the following Medicare IM given date fields will be blank) Date Medicare IM given:   Medicare IM given by:   Date Additional Medicare IM given:   Additional Medicare IM given by:    Discharge Disposition:  HOME/SELF CARE  Per UR Regulation:  Reviewed for med. necessity/level of care/duration of stay  If discussed at Long Length of Stay Meetings, dates discussed:    Comments:  Lovenox injections available at [facility]. [place] pay $40.00. Pt is aware. No further needs from CM at this time. Jacqlyn Krauss, RN,BSN [phone]

## 2014-09-27 ENCOUNTER — Encounter (HOSPITAL_COMMUNITY): Payer: 59

## 2014-10-02 ENCOUNTER — Encounter (HOSPITAL_COMMUNITY)
Admission: RE | Admit: 2014-10-02 | Discharge: 2014-10-02 | Disposition: A | Discharge status: Home / Self Care | Payer: 59 | Source: Ambulatory Visit | Attending: Internal Medicine | Admitting: Internal Medicine

## 2014-10-02 DIAGNOSIS — C3490 Malignant neoplasm of unspecified part of unspecified bronchus or lung: Secondary | ICD-10-CM | POA: Diagnosis not present

## 2014-10-02 DIAGNOSIS — R918 Other nonspecific abnormal finding of lung field: Secondary | ICD-10-CM

## 2014-10-02 LAB — GLUCOSE, CAPILLARY: Glucose-Capillary: 97 mg/dL (ref 70–99)

## 2014-10-02 MED ORDER — FLUDEOXYGLUCOSE F - 18 (FDG) INJECTION
9.8000 | Freq: Once | INTRAVENOUS | Status: AC | PRN
  Administered 2014-10-02: 9.8 via INTRAVENOUS

## 2014-10-15 ENCOUNTER — Encounter: Payer: Self-pay | Admitting: Internal Medicine

## 2014-10-15 ENCOUNTER — Ambulatory Visit (INDEPENDENT_AMBULATORY_CARE_PROVIDER_SITE_OTHER): Payer: 59 | Admitting: Internal Medicine

## 2014-10-15 VITALS — BP 140/84 | HR 67 | Ht 71.0 in | Wt 164.8 lb

## 2014-10-15 DIAGNOSIS — R918 Other nonspecific abnormal finding of lung field: Secondary | ICD-10-CM | POA: Diagnosis not present

## 2014-10-15 DIAGNOSIS — I2699 Other pulmonary embolism without acute cor pulmonale: Secondary | ICD-10-CM

## 2014-10-15 MED ORDER — ENOXAPARIN SODIUM 100 MG/ML ~~LOC~~ SOLN: 100.0000 mg | SUBCUTANEOUS | Status: AC

## 2014-10-15 NOTE — Patient Instructions (Addendum)
Please see patient coordinator before you leave today  to schedule and venous dopplers and T surgery evaluation   Stay on Lovenox in meantime and try to stop smoking completely if possibl e

## 2014-10-15 NOTE — Assessment & Plan Note (Addendum)
Left upper lobe pulmonary nodule exhibits malignant range FDG uptake and is worrisome for primary bronchogenic carcinoma. Assuming non-small cell histology this is consistent with at least a T3N3M0 or Stage IIIB. -- Spirometry 10/15/2014  wnl   He probably does have III B but it may not easy to prove as the nodes are relatively small and my preference is to give such young patients with nl lung function the benefit of the doubt.  May also have a truseau's syndrome with hypercoagulability so need to time intervention carefully, preferably p on lovenox x 6 weeks and off x 1.5 days   Discussed in detail all the  indications, usual  risks and alternatives  relative to the benefits with patient who agrees to proceed with  T surgery eval

## 2014-10-15 NOTE — Assessment & Plan Note (Signed)
Dx 09/21/14 without angiogram  - Echo with mild RVE/ PAS 35 - Venous dopplers 10/15/2014 >>>   The clot burden may have been underestimated by CT s angiogram but he is much better now on lovenox so moot issue except the higher the clot burden the higher the risk of stopping lovenox prematurely - six weeks and then off x 1.5 days prior to surgery should be reasonable (3 months also reasonable but based on his age I would push ahead with surgery if feasible)

## 2014-10-15 NOTE — Progress Notes (Signed)
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 Tumor on L     Admit date: 09/21/2014 Discharge date: 09/24/2014  Recommendations for Outpatient Follow-up:  1. F/u primary care in 1 week for screening for bleeding problems, renewal of prescription for lovenox injections 2. F/u pulmonology in 2 weeks to schedule endobronchial [place] and biopsy 3. PET scan on Thursday 4. F/u cardiology, Dr. Martinique in 2-3 weeks because patient will need guidance about his aspirin and plavix prior to endobronchial biopsy  Discharge Diagnoses:  Principal Problem:  PE (pulmonary embolism)   Coronary artery disease  Hypertension  Tobacco abuse  Pulmonary embolism  Lung mass  Swelling of lower extremity  Demand ischemia   Discharge Condition: stable, improved  Diet recommendation: healthy heart   Wt Readings from Last 3 Encounters:  09/24/14 71.305 kg (157 lb 3.2 oz)  06/28/14 72.176 kg (159 lb 1.9 oz)  06/21/14 73.9 kg (162 lb 14.7 oz)    History of present illness:   The 54 y/o male followed by Dr. Martinique for coronary disease, HTN and HLD. He is also a chronic smoker for 37 years. He has a history of inferior MI. The patient has had acute coronary syndrome on 2 separate occasions (2007 and 2014) treated with PCI in both settings. He was evaluated in November 2015 with progressive chest pain at rest and with low-level exertion. He underwent cardiac catheterization demonstrating total occlusion of the right coronary artery within the previously stented segment and severe in-stent restenosis of the left circumflex. He was treated with PCI of the left circumflex using a drug-eluting stent. LV function was preserved with an ejection fraction of 50-55%. He was placed on ASA + Plavix . His last OV with Dr. Martinique was 06/28/14 and he was felt to be  stable from a cardiac standpoint.   He presented with left shoulder pain without associated dyspnea, diaphoresis, lightheadness or dizziness. He also had left calf pain and swelling for three days prior to presentation. Duplex was negative but CT of chest showed acute PE in the upper lobe branch of the right pulmonary artery and 2.3 x 1.6 cm spiculated density in left suprahilar region concerning for malignancy. Immediately superior to this, an 8 mm spiculated abnormality was also susicious for malignancy. Left hilar, aorticopulmonary window and right peritracheal adenopathy was consistent with metastatic disease.   Hospital Course:   Left lung mass with lymphadenopathy - Appreciate pulmonology assistance - MRI negative for metastatic disease - PET scan scheduled for Thursday, results to be reviewed with pulmonology at appointment in two weeks - Biopsy 4 weeks from initiation of anticoagulation: Earliest would be 10/18/14 - Patient to follow-up with pulmonology in about 2 weeks to schedule the bronchoscopy and biopsy  Acute PE  Transition to lovenox for a/c in the setting of malignancy Will be HIGH bleed risk with ASA + PLAVIX + LOVENOX, patient aware  Demand ischemia/strain from PE in setting of known CAD and recent stent placement 05/2014 - Troponin trending down (peak 1.38 on 2/26) - ECHO: Normal systolic function with ejection fraction of 69-62%, grade 1 diastolic dysfunction, peak PA pressure 35 mmHg, right ventricle mildly dilated with normal wall thickness - Continue high dose statin, aspirin, plavix - Counseled about smoking cessation  HTN, blood pressure  generally well controlled, continued home medications  Hyperlipidemia, on high dose statin  Low grade fever, likely related to pulmonary embolism. CXR and UA neg for signs of infection.   Lower extremity swelling - Venous duplex: neg - TED  hose  Consultants:  Cardiology  Pulmonology  Procedures:  CTa chest  MRI brain  Echo     10/15/2014 1st [facility] office visit/ [name]   Chief Complaint  Patient presents with  . Advice Only    Pt was d/c from hosp 09/24/14. Pt denies sob, cough, wheeze and or chest tightness. Pt sees flashers when he gets hungry that ends with headache.   L shoulder pain was vaguely pleuritic but has resolved. Not limited by breathing from desired activities    No obvious day to day or daytime variabilty or assoc chronic cough or cp or chest tightness, subjective wheeze overt sinus or hb symptoms. No unusual exp hx or h/o childhood pna/ asthma or knowledge of premature birth.  Sleeping ok without nocturnal  or early am exacerbation  of respiratory  c/o's or need for noct saba. Also denies any obvious fluctuation of symptoms with weather or environmental changes or other aggravating or alleviating factors except as outlined above   Current Medications, Allergies, Complete Past Medical History, Past Surgical History, Family History, and Social History were reviewed in [employer] record.  ROS  The following are not active complaints unless bolded sore throat, dysphagia, dental problems, itching, sneezing,  nasal congestion or excess/ purulent secretions, ear ache,   fever, chills, sweats, unintended wt loss, pleuritic or exertional cp, hemoptysis,  orthopnea pnd or leg swelling, presyncope, palpitations, heartburn, abdominal pain, anorexia, nausea, vomiting, diarrhea  or change in bowel or urinary habits, change in stools or urine, dysuria,hematuria,  rash, arthralgias, visual complaints, headache, numbness weakness or ataxia or problems with walking or coordination,  change in mood/affect or memory.       Review of Systems     Objective:   Physical Exam Wt Readings from Last 3 Encounters:  10/15/14 164 lb 12.8 oz (74.753 kg)  09/24/14 157 lb 3.2 oz (71.305 kg)   06/28/14 159 lb 1.9 oz (72.176 kg)    Vital signs reviewed   HEENT: nl dentition, turbinates, and orophanx. Nl external ear canals without cough reflex   NECK :  without JVD/Nodes/TM/ nl carotid upstrokes bilaterally   LUNGS: no acc muscle use, clear to A and P bilaterally without cough on insp or exp maneuvers   CV:  RRR  no s3 or murmur or increase in P2, no edema   ABD:  soft and nontender with nl excursion in the supine position. No bruits or organomegaly, bowel sounds nl  MS:  warm without deformities, calf tenderness, cyanosis or clubbing  SKIN: warm and dry without lesions    NEURO:  alert, approp, no deficits   I personally reviewed images and agree with radiology impression as follows:  CT with contrast (not angiogram) 09/21/14 2.3 x 1.6 cm spiculated density seen in left suprahilar region concerning for malignancy. Immediately superior to this, 8 mm spiculated abnormality is noted in left lung apex concerning for malignancy is well. Left hilar, aorticopulmonary window and right peritracheal adenopathy is noted consistent with metastatic disease.  Acute pulmonary embolus seen in upper lobe branch of right pulmonary artery.         Assessment:

## 2014-10-16 ENCOUNTER — Institutional Professional Consult (permissible substitution) (INDEPENDENT_AMBULATORY_CARE_PROVIDER_SITE_OTHER): Payer: 59 | Admitting: Thoracic Surgery (Cardiothoracic Vascular Surgery)

## 2014-10-16 ENCOUNTER — Encounter: Payer: Self-pay | Admitting: Thoracic Surgery (Cardiothoracic Vascular Surgery)

## 2014-10-16 VITALS — BP 128/86 | HR 74 | Resp 16 | Ht 69.0 in | Wt 164.0 lb

## 2014-10-16 DIAGNOSIS — I2699 Other pulmonary embolism without acute cor pulmonale: Secondary | ICD-10-CM

## 2014-10-16 DIAGNOSIS — D381 Neoplasm of uncertain behavior of trachea, bronchus and lung: Secondary | ICD-10-CM | POA: Diagnosis not present

## 2014-10-16 DIAGNOSIS — R599 Enlarged lymph nodes, unspecified: Secondary | ICD-10-CM

## 2014-10-16 DIAGNOSIS — R591 Generalized enlarged lymph nodes: Secondary | ICD-10-CM | POA: Diagnosis not present

## 2014-10-16 NOTE — Progress Notes (Signed)
PCP is [name], MD Referring Provider is Tanda Rockers, MD  Chief Complaint  Patient presents with  . Lung Lesion    CT CHEST 09/21/14, MRI BRAIN 09/22/14. PET 10/12/14...office spiromety only  . Adenopathy    HPI: Mr. Korol is a 54 year old gentleman sent for consultation regarding a left hilar mass with mediastinal adenopathy.  Mr. Hawkes is a 54 year old gentleman with a history of tobacco abuse (one pack per day for 39 years), coronary artery disease with 2 previous MIs, both treated with stents, hypertension, hyperlipidemia, and recently diagnosed pulmonary embolism.  He was in his usual state of health until late February. He had noticed that he was having swelling in his legs, primarily in the feet and ankles. This was worse on the left than the right. He says that while he was waiting to have a duplex done to rule out DVT he developed a headache and was sent to the emergency room. According to his ER records he developed left shoulder pain associated with nausea and vomiting and that led to him being sent to the emergency room. Regardless on arrival to the emergency room a CT of the chest with contrast was done. It showed an acute pulmonary embolus and a right upper lobe pulmonary artery branch. There was a left hilar mass associated with left hilar, paratracheal, and aortopulmonary window adenopathy. His troponin was mildly elevated.  He was treated with Lovenox for his already embolus.  He was referred to Dr. Melvyn Novas. A PET CT was done which showed the left hilar mass, AP window, paratracheal, subcarinal and left hilar nodes were hypermetabolic. There also was a small hypermetabolic left supraclavicular node.  Mr. Chamberlin smoked about a pack of cigarettes daily for 39 years. He says he has not smoked cigarettes since Friday. He denies cough, wheezing, fevers, chills, night sweats, weight loss, change in appetite. He does have migraine headaches. He had a negative brain MR during his  workup. He complains of pain in his legs with walking. He has gained a few pounds over the past 6 months. He does bleed and bruise easily since she's been on Plavix.  Zubrod Score: At the time of surgery this patient's most appropriate activity status/level should be described as: [x]     0    Normal activity, no symptoms []     1    Restricted in physical strenuous activity but ambulatory, able to do out light work []     2    Ambulatory and capable of self care, unable to do work activities, up and about >50 % of waking hours                              []     3    Only limited self care, in bed greater than 50% of waking hours []     4    Completely disabled, no self care, confined to bed or chair []     5    Moribund     Past Medical History  Diagnosis Date  . HLD (hyperlipidemia)   . HTN (hypertension)     "took me off my BP pills" (06/20/2014)  . GERD (gastroesophageal reflux disease)     hx (06/20/2014)  . History of gout   . Coronary artery disease     a. s/p Taxus DES to CFX 8/06 in setting of NSTEMI;  b.  s/p Inf STEMI 06/21/11:  mRCA occluded>> PCI:  Overlapping Promus DES x 2 to RCA.;  c.  LHC (11/15):  pLAD 20-30%, oDx branches 50-60%, mCFX stent 95% ISR, dCFX stent ok, mRCA stent occluded with L-R collats, EF 50-55%, basal inf AK, mid Inf HK >> PCI:  3.25 x 23 mm Xience DES to [place]  . Myocardial infarction 02/2005; 05/2011  . Pulmonary embolism 09/21/2014    Past Surgical History  Procedure Laterality Date  . Left heart cath N/A 06/21/2011    Procedure: LEFT HEART CATH;  Surgeon: [name], MD;  Location: [facility] CATH LAB;  Service: Cardiovascular;  Laterality: N/A;  . Left heart catheterization with coronary angiogram N/A 06/20/2014    Procedure: LEFT HEART CATHETERIZATION WITH CORONARY ANGIOGRAM;  Surgeon: Peter M Martinique, MD;  Location: [facility] CATH LAB;  Service: Cardiovascular;  Laterality: N/A;  . Coronary angioplasty with stent placement  02/2005; 05/2011; 06/20/2014    "1;  ?2; 1"    Family History  Problem Relation Age of Onset  . Hypertension Father     Social History History  Substance Use Topics  . Smoking status: Current Every Day Smoker -- 1.00 packs/day for 39 years    Types: Cigarettes  . Smokeless tobacco: Never Used     Comment: pt has not smoked in 2 days.  . Alcohol Use: No    Current Outpatient Prescriptions  Medication Sig Dispense Refill  . aspirin 81 MG tablet Take 81 mg by mouth daily.    [name] atorvastatin (LIPITOR) 80 MG tablet TAKE ONE TABLET BY MOUTH ONCE DAILY 30 tablet 5  . clopidogrel (PLAVIX) 75 MG tablet TAKE ONE TABLET BY MOUTH ONCE DAILY 30 tablet 5  . enoxaparin (LOVENOX) 100 MG/ML injection Inject 1 mL (100 mg total) into the skin daily. 30 Syringe 2  . metoprolol tartrate (LOPRESSOR) 25 MG tablet TAKE ONE-HALF TABLET BY MOUTH TWICE DAILY 30 tablet 5  . nitroGLYCERIN (NITROSTAT) 0.4 MG SL tablet Place 1 tablet (0.4 mg total) under the tongue every 5 (five) minutes as needed for chest pain. 25 tablet 11  . sildenafil (VIAGRA) 50 MG tablet Take 50 mg by mouth daily as needed for erectile dysfunction.     No current facility-administered medications for this visit.    No Known Allergies  Review of Systems  Constitutional: Positive for unexpected weight change (has gained a few pounds in last 6 months). Negative for fever, chills, activity change and appetite change.  HENT: Positive for dental problem.   Respiratory: Negative for cough, shortness of breath and wheezing.   Cardiovascular: Positive for chest pain (left subscapular) and leg swelling.       Pain in legs with walking  Neurological: Positive for headaches (migraines).  Hematological: Negative for adenopathy. Bruises/bleeds easily.  All other systems reviewed and are negative.   BP 128/86 mmHg  Pulse 74  Resp 16  Ht 5\' 9"  (1.753 m)  Wt 164 lb (74.39 kg)  BMI 24.21 kg/m2  SpO2 98% Physical Exam  Constitutional: He is oriented to person, place, and time.  He appears well-developed and well-nourished. No distress.  HENT:  Head: Normocephalic and atraumatic.  Eyes: EOM are normal. Pupils are equal, round, and reactive to light.  Neck: Neck supple. No thyromegaly present.  Cardiovascular: Normal rate, regular rhythm, normal heart sounds and intact distal pulses.  Exam reveals no gallop and no friction rub.   No murmur heard. Pulmonary/Chest: Effort normal and breath sounds normal. He has no wheezes.  Abdominal: Soft. There is no tenderness.  Musculoskeletal: He  exhibits no edema.  Lymphadenopathy:    He has cervical adenopathy (questionably palpable left supraclavicular node).  Neurological: He is alert and oriented to person, place, and time. No cranial nerve deficit.  No focal motor deficit  Skin: Skin is warm and dry.  Psychiatric: He has a normal mood and affect.  Vitals reviewed.    Diagnostic Tests: CT CHEST WITH CONTRAST  TECHNIQUE: Multidetector CT imaging of the chest was performed during intravenous contrast administration.  CONTRAST: 130mL OMNIPAQUE IOHEXOL 300 MG/ML SOLN  COMPARISON: Chest radiograph of same day.  FINDINGS: No pneumothorax or plural effusion is noted. Right lung is clear. 2.3 x 1.6 cm spiculated abnormality is noted in left suprahilar region concerning for malignancy. Mild biapical scarring is noted. 8 mm spiculated density is noted in left lung apex concerning for malignancy. Left hilar adenopathy is noted measure 3.1 x 1.9 cm. Adenopathy is also noted in the aortopulmonary window with the largest measuring 2.3 x 1.4 cm. Right peritracheal lymph node measuring 12 x 9 mm is noted.  There is no evidence of thoracic aortic dissection or aneurysm. Filling defect is seen in upper lobe branch of right pulmonary artery consistent with pulmonary embolus. Visualized portion of upper abdomen appears normal. No significant osseous abnormality is noted in the chest.  IMPRESSION: 2.3 x 1.6 cm  spiculated density seen in left suprahilar region concerning for malignancy. Immediately superior to this, 8 mm spiculated abnormality is noted in left lung apex concerning for malignancy is well. Left hilar, aorticopulmonary window and right peritracheal adenopathy is noted consistent with metastatic disease.  Acute pulmonary embolus seen in upper lobe branch of right pulmonary artery. Critical Value/emergent results were called by telephone at the time of interpretation on 09/21/2014 at 4:35 pm to Dr. Glendell Docker , who verbally acknowledged these results.   Electronically Signed  By: Marijo Conception, M.D.  On: 09/21/2014 16:36 NUCLEAR MEDICINE PET SKULL BASE TO THIGH  TECHNIQUE: 9.8 mCi F-18 FDG was injected intravenously. Full-ring PET imaging was performed from the skull base to thigh after the radiotracer. CT data was obtained and used for attenuation correction and anatomic localization.  FASTING BLOOD GLUCOSE: Value: 97 mg/dl  COMPARISON: 09/21/2014  FINDINGS: NECK  Left supraclavicular lymph node measures 8 mm and has an SUV max equal to 3.8. Lymph node posterior to right lobe of thyroid gland measures 1 cm. This has an SUV max equal to 3.8.  CHEST  There are hypermetabolic bilateral mediastinal lymph nodes. Right paratracheal lymph node measures 8 mm and has an SUV max equal to 6.14. Hypermetabolic sub- carinal lymph node measures 1.1 cm and has an SUV max equal to 6.7. Hypermetabolic left hilar lymph node measures 3.2 cm and has an SUV max equal to 5.9. Within the left upper lobe there is a central perihilar lesion which measures 2.3 x 1.6 x 2.5 cm and has an SUV max equal to 10.2. Within the left apex there is a small satellite nodule measuring 8 mm. This has an SUV max equal to 2.5.  ABDOMEN/PELVIS  No abnormal hypermetabolic activity within the liver, pancreas, adrenal glands, or spleen. There is calcified atherosclerotic disease  involving the abdominal aorta and its branches. No aneurysm. No hypermetabolic lymph nodes in the abdomen or pelvis.  SKELETON  No focal hypermetabolic activity to suggest skeletal metastasis.  IMPRESSION: 1. Left upper lobe pulmonary nodule exhibits malignant range FDG uptake and is worrisome for primary bronchogenic carcinoma. Assuming non-small cell histology this is consistent with at  least a T3N3M0 or Stage IIIB   Electronically Signed  By: Kerby Moors M.D.  On: 10/02/2014 16:30   Impression: Mr. Morino is a 54 year old man with a history of tobacco abuse who recently was found to have an acute pulmonary embolus by CT, which is being treated with Lovenox. On the same CT that diagnosis PE he was found to have a left hilar mass with extensive hilar and mediastinal adenopathy. A PET CT showed the left hilar mass, hilar and mediastinal lymph nodes, and a left clavicular node were all hypermetabolic. 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  I personally reviewed the CT and PET CT and also reviewed the films with Mr. and Mrs. Ancheta.  We discussed the differential diagnosis. They understand that this most likely represents an advanced stage lung cancer and must be considered such unless it can be proven otherwise.  Clinically he has stage IIIB disease so is not a candidate for surgical resection.  The first order of business is to obtain a biopsy for diagnostic and staging purposes, so that appropriate treatment can be initiated. Unfortunately, this is complicated by his recent acute pulmonary embolism and pre-existing coronary stents. He currently is on aspirin, Lovenox, and Plavix.  He will need to hold Lovenox for a day and a half prior to biopsy. He will need to hold the Plavix for 4-5 days. That will need to be discussed with cardiology.  I think the best initial approach for him would be to do a bronchoscopy and endobronchial  ultrasound. More than likely that would provide adequate diagnosis, staging, and provide adequate tissue for molecular testing. If nondiagnostic and mediastinal endoscopy would certainly work, and it might be best if that was scheduled to be done at the same setting if the initial samples were negative.  I did discuss the general nature of bronchoscopy and endobronchial ultrasound with Mr. and Mrs. Duer. They understand that this is a diagnostic and therapeutic procedure. They understand it would be done in the operating room under general anesthesia. They are aware of the general conduct of the procedure. They also are aware of the decision-making that would go into whether to proceed with a mediastinal endoscopy at the same setting. I reviewed the indications, risks, benefits, and alternatives. They understand that the risks include, but are not limited to bleeding, pneumothorax, and failure to make a diagnosis. They understand that with mediastinal endoscopy there is risk of bleeding, recurrent nerve injury, stroke, esophageal injury, pneumothorax. They also understand that there is a risk of other unforeseeable complications with any invasive procedure.  Plan: I'll discuss arranging bronchoscopy and endobronchial ultrasound with Dr. Melvyn Novas  Given the complicated nature of managing his anticoagulation, it may be best to be able to do everything at one setting, to avoid going on and off anticoagulation.    [name], MD [facility] [phone]

## 2014-10-17 ENCOUNTER — Ambulatory Visit (HOSPITAL_COMMUNITY): Payer: 59 | Attending: Internal Medicine | Admitting: *Deleted

## 2014-10-17 ENCOUNTER — Other Ambulatory Visit: Payer: Self-pay | Admitting: *Deleted

## 2014-10-17 ENCOUNTER — Telehealth: Payer: Self-pay | Admitting: *Deleted

## 2014-10-17 DIAGNOSIS — J984 Other disorders of lung: Secondary | ICD-10-CM

## 2014-10-17 DIAGNOSIS — I2699 Other pulmonary embolism without acute cor pulmonale: Secondary | ICD-10-CM | POA: Diagnosis not present

## 2014-10-17 DIAGNOSIS — R590 Localized enlarged lymph nodes: Secondary | ICD-10-CM

## 2014-10-17 NOTE — Telephone Encounter (Signed)
Patient is scheduled for Bronch with EBUS, possible mediastinoscopy with Dr. Roxan Hockey on Monday 4/11 at 7:30am.  He should stop his plavix 4 days before surgery and stop his lovenox after his dose on the Saturday morning before the surgery.  Patient is aware of all of this.  He has no further questions.

## 2014-10-17 NOTE — Progress Notes (Signed)
Quick Note:  LMTCB on home and cell ______

## 2014-10-17 NOTE — Progress Notes (Signed)
Quick Note:  Spoke with pt and notified of results per Dr. Wert. Pt verbalized understanding and denied any questions.  ______ 

## 2014-10-17 NOTE — Progress Notes (Signed)
Bilateral Lower Extremity Venous Duplex Exam Performed - Negative for DVT

## 2014-10-22 ENCOUNTER — Telehealth: Payer: Self-pay | Admitting: Internal Medicine

## 2014-10-22 NOTE — Telephone Encounter (Signed)
Result Note     Call patient : Study is unremarkable, no change in recs   I spoke with patient about results and he verbalized understanding and had no questions.

## 2014-10-25 ENCOUNTER — Other Ambulatory Visit (HOSPITAL_COMMUNITY): Payer: Self-pay | Admitting: *Deleted

## 2014-10-25 NOTE — Pre-Procedure Instructions (Signed)
[name]  10/25/2014   Your procedure is scheduled on:  Monday, November 05, 2014 at 7:30 AM.   Report to [facility] Entrance "A" Admitting Office at 5:30 AM.   Call this number if you have problems the morning of surgery: [phone]               Any questions prior to day of surgery, please call [phone] between 8 & 4 PM.    Remember:   Do not eat food or drink liquids after midnight Sunday, 11/04/14.   Take these medicines the morning of surgery with A SIP OF WATER:   Stop Plavix 4 days prior to surgery. Stop Lovenox after dose on the Saturday before surgery.   Do not wear jewelry.  Do not wear lotions, powders, or cologne. You may NOT wear deodorant.  Men may shave face and neck.  Do not bring valuables to the hospital.  [facility] is not responsible                  for any belongings or valuables.               Contacts, dentures or bridgework may not be worn into surgery.  Leave suitcase in the car. After surgery it may be brought to your room.  For patients admitted to the hospital, discharge time is determined by your                treatment team.               Special Instructions: See "Preparing for Surgery" Instruction sheet.    Please read over the following fact sheets that you were given: Pain Booklet, Coughing and Deep Breathing, Blood Transfusion Information, MRSA Information and Surgical Site Infection Prevention

## 2014-10-26 ENCOUNTER — Encounter (HOSPITAL_COMMUNITY)
Admission: RE | Admit: 2014-10-26 | Discharge: 2014-10-26 | Disposition: A | Discharge status: Home / Self Care | Payer: 59 | Source: Ambulatory Visit | Attending: Thoracic Surgery (Cardiothoracic Vascular Surgery) | Admitting: Thoracic Surgery (Cardiothoracic Vascular Surgery)

## 2014-10-26 ENCOUNTER — Encounter (HOSPITAL_COMMUNITY): Payer: Self-pay

## 2014-10-26 VITALS — BP 140/75 | HR 72 | Temp 98.3°F | Resp 18 | Ht 69.0 in | Wt 161.8 lb

## 2014-10-26 DIAGNOSIS — Z01810 Encounter for preprocedural cardiovascular examination: Secondary | ICD-10-CM | POA: Insufficient documentation

## 2014-10-26 DIAGNOSIS — J984 Other disorders of lung: Secondary | ICD-10-CM

## 2014-10-26 DIAGNOSIS — R590 Localized enlarged lymph nodes: Secondary | ICD-10-CM

## 2014-10-26 HISTORY — DX: Other nonspecific abnormal finding of lung field: R91.8

## 2014-10-26 LAB — TYPE AND SCREEN
ABO/RH(D): O POS
Antibody Screen: NEGATIVE

## 2014-10-26 LAB — PROTIME-INR
INR: 1.11 (ref 0.00–1.49)
Prothrombin Time: 14.4 seconds (ref 11.6–15.2)

## 2014-10-26 LAB — COMPREHENSIVE METABOLIC PANEL
ALK PHOS: 114 U/L (ref 39–117)
ALT: 33 U/L (ref 0–53)
ANION GAP: 9 (ref 5–15)
AST: 22 U/L (ref 0–37)
Albumin: 4.3 g/dL (ref 3.5–5.2)
BILIRUBIN TOTAL: 0.6 mg/dL (ref 0.3–1.2)
BUN: 17 mg/dL (ref 6–23)
CHLORIDE: 106 mmol/L (ref 96–112)
CO2: 21 mmol/L (ref 19–32)
Calcium: 10 mg/dL (ref 8.4–10.5)
Creatinine, Ser: 0.92 mg/dL (ref 0.50–1.35)
GFR calc non Af Amer: >90 mL/min (ref >90)
GLUCOSE: 106 mg/dL — AB (ref 70–99)
Potassium: 4.5 mmol/L (ref 3.5–5.1)
Sodium: 136 mmol/L (ref 135–145)
TOTAL PROTEIN: 7.8 g/dL (ref 6.0–8.3)

## 2014-10-26 LAB — CBC
HCT: 42 % (ref 39.0–52.0)
Hemoglobin: 14.4 g/dL (ref 13.0–17.0)
MCH: 32.1 pg (ref 26.0–34.0)
MCHC: 34.3 g/dL (ref 30.0–36.0)
MCV: 93.8 fL (ref 78.0–100.0)
Platelets: 325 10*3/uL (ref 150–400)
RBC: 4.48 MIL/uL (ref 4.22–5.81)
RDW: 12.7 % (ref 11.5–15.5)
WBC: 14.8 10*3/uL — AB (ref 4.0–10.5)

## 2014-10-26 LAB — ABO/RH: ABO/RH(D): O POS

## 2014-10-26 LAB — APTT: aPTT: 33 seconds (ref 24–37)

## 2014-10-29 ENCOUNTER — Encounter (HOSPITAL_COMMUNITY): Payer: Self-pay

## 2014-10-29 NOTE — Progress Notes (Signed)
Anesthesia Chart Review:  Patient is a 54 year old male scheduled for video bronchoscopy with EBUS, possible mediastinoscopy on 11/05/14 by Dr. Roxan Hockey. On 09/21/14, he was diagnosed with acute PE with CTA also showing spiculated left suprahilar density worrisome for primary lung CA.  PET CT confirmed left hilar mass, hilar and mediastinal LN, and left clavicular node that were hypermetabolic.   History includes smoking, CAD with NSTEMI s/p DES CX 02/2005 and inferior STEMI 05/2011 with RCA occlusion s/p DES X 2 RCA and on 06/20/14 95% ISR CX s/p DES, HTN, HLD, PE 09/21/14, marijuana use. PCP is Dr. Marisue Humble. Pulmonologist is Dr. Melvyn Novas. Cardiologist is Dr. Burt Knack.   Meds include ASA 81 mg, Lipitor, Plavix, Lovenox, Lopressor, Nitro, Viagra. Per [facility] RN Rolena Infante, "He should stop his plavix 4 days before surgery and stop his lovenox after his dose on the Saturday morning before the surgery. Patient is aware of all of this. He has no further questions."  Per Dr. Melvyn Novas notes, "The clot burden may have been underestimated by CT s angiogram but he is much better now on lovenox so moot issue except the higher the clot burden the higher the risk of stopping lovenox prematurely - six weeks and then off x 1.5 days prior to surgery should be reasonable (3 months also reasonable but based on his age I would push ahead with surgery if feasible)."  Per Dr. Antionette Char 10/17/14 addendum to his 06/28/14 note: "Spoke with Dr Roxan Hockey yesterday who informed me of patient's lund CA dx and advised the patient will need biopsies, etc. He had PCI in November, now about 4 months out from his last PCI. Considering need for tissue dx and treatment, advised it's ok to proceed and hold plavix 4-5 days before surgery. He will be able to stay on ASA 81 mg. Pt understands plan - we spoke on the telephone today."   10/26/14 EKG: NSR, minimal voltage criteria for LVH, ST elevation, consider early repolarization. He had minimal ST elevation on  prior EKG from 09/21/14.  09/22/14 Echo: Study Conclusions - Left ventricle: The cavity size was normal. Wall thickness was normal. Systolic function was normal. The estimated ejection fraction was in the range of 55% to 60%. Wall motion was normal; there were no regional wall motion abnormalities. Doppler parameters are consistent with abnormal left ventricular relaxation (grade 1 diastolic dysfunction). - Right ventricle: The cavity size was mildly dilated. Wall thickness was normal. - Pulmonary arteries: PA peak pressure: 35 mm Hg (S).  06/20/14 Cardiac cath (done for unstable angina): pLAD 20-30%, oDx branches 50-60%, mCFX stent 95% ISR, dCFX ste nt ok, mRCA stent occluded with L-R collats, EF 50-55%, basal inf AK, mid Inf HK >> PCI: 3.25 x 23 mm Xience DES to mCFX. REC: AS/PLAVIX for 12 months and favor long-term without interruption. Aggressive risk reduction, tobacco cessation. (Dr. [name]).   10/18/14 LE venous duplex: No evidence of LE DVT or superficial venous thrombus or incompetence, bilaterally.  Preoperative labs noted.   He is for CXR on the day of surgery.  If no acute changes then I would anticipate that he could proceed as planned.  George Hugh [facility] Short Stay Center/Anesthesiology Phone [phone] 10/29/2014 11:51 AM

## 2014-11-04 MED ORDER — DEXTROSE 5 % IV SOLN
1.5000 g | INTRAVENOUS | Status: AC
  Administered 2014-11-05: 1.5 g via INTRAVENOUS
  Filled 2014-11-04: qty 1.5

## 2014-11-05 ENCOUNTER — Encounter (HOSPITAL_COMMUNITY)
Admission: RE | Disposition: A | Discharge status: Home / Self Care | Payer: Self-pay | Source: Ambulatory Visit | Attending: Thoracic Surgery (Cardiothoracic Vascular Surgery)

## 2014-11-05 ENCOUNTER — Ambulatory Visit (HOSPITAL_COMMUNITY): Payer: 59 | Admitting: Vascular Surgery

## 2014-11-05 ENCOUNTER — Ambulatory Visit (HOSPITAL_COMMUNITY): Payer: 59 | Admitting: Anesthesiology

## 2014-11-05 ENCOUNTER — Telehealth: Payer: Self-pay | Admitting: *Deleted

## 2014-11-05 ENCOUNTER — Encounter (HOSPITAL_COMMUNITY): Payer: Self-pay | Admitting: Critical Care Medicine

## 2014-11-05 ENCOUNTER — Ambulatory Visit (HOSPITAL_COMMUNITY)
Admission: RE | Admit: 2014-11-05 | Discharge: 2014-11-05 | Disposition: A | Discharge status: Home / Self Care | Payer: 59 | Source: Ambulatory Visit | Attending: Thoracic Surgery (Cardiothoracic Vascular Surgery) | Admitting: Thoracic Surgery (Cardiothoracic Vascular Surgery)

## 2014-11-05 ENCOUNTER — Ambulatory Visit (HOSPITAL_COMMUNITY): Payer: 59

## 2014-11-05 ENCOUNTER — Other Ambulatory Visit: Payer: Self-pay | Admitting: *Deleted

## 2014-11-05 DIAGNOSIS — Z7982 Long term (current) use of aspirin: Secondary | ICD-10-CM | POA: Insufficient documentation

## 2014-11-05 DIAGNOSIS — R590 Localized enlarged lymph nodes: Secondary | ICD-10-CM | POA: Diagnosis not present

## 2014-11-05 DIAGNOSIS — R918 Other nonspecific abnormal finding of lung field: Secondary | ICD-10-CM

## 2014-11-05 DIAGNOSIS — I2510 Atherosclerotic heart disease of native coronary artery without angina pectoris: Secondary | ICD-10-CM | POA: Diagnosis not present

## 2014-11-05 DIAGNOSIS — Z86711 Personal history of pulmonary embolism: Secondary | ICD-10-CM | POA: Diagnosis not present

## 2014-11-05 DIAGNOSIS — I10 Essential (primary) hypertension: Secondary | ICD-10-CM | POA: Diagnosis not present

## 2014-11-05 DIAGNOSIS — K219 Gastro-esophageal reflux disease without esophagitis: Secondary | ICD-10-CM | POA: Diagnosis not present

## 2014-11-05 DIAGNOSIS — Z79899 Other long term (current) drug therapy: Secondary | ICD-10-CM | POA: Insufficient documentation

## 2014-11-05 DIAGNOSIS — F17210 Nicotine dependence, cigarettes, uncomplicated: Secondary | ICD-10-CM | POA: Diagnosis not present

## 2014-11-05 DIAGNOSIS — J984 Other disorders of lung: Secondary | ICD-10-CM

## 2014-11-05 DIAGNOSIS — C3412 Malignant neoplasm of upper lobe, left bronchus or lung: Secondary | ICD-10-CM | POA: Insufficient documentation

## 2014-11-05 DIAGNOSIS — M109 Gout, unspecified: Secondary | ICD-10-CM | POA: Diagnosis not present

## 2014-11-05 DIAGNOSIS — E785 Hyperlipidemia, unspecified: Secondary | ICD-10-CM | POA: Diagnosis not present

## 2014-11-05 DIAGNOSIS — D381 Neoplasm of uncertain behavior of trachea, bronchus and lung: Secondary | ICD-10-CM | POA: Diagnosis not present

## 2014-11-05 HISTORY — PX: VIDEO BRONCHOSCOPY WITH ENDOBRONCHIAL ULTRASOUND: SHX6177

## 2014-11-05 HISTORY — PX: MEDIASTINOSCOPY: SHX5086

## 2014-11-05 SURGERY — BRONCHOSCOPY, WITH EBUS
Anesthesia: General | Site: Chest

## 2014-11-05 MED ORDER — SUCCINYLCHOLINE CHLORIDE 20 MG/ML IJ SOLN
INTRAMUSCULAR | Status: AC
  Filled 2014-11-05: qty 1

## 2014-11-05 MED ORDER — ROCURONIUM BROMIDE 50 MG/5ML IV SOLN
INTRAVENOUS | Status: AC
  Filled 2014-11-05: qty 1

## 2014-11-05 MED ORDER — ONDANSETRON HCL 4 MG/2ML IJ SOLN
INTRAMUSCULAR | Status: AC
  Filled 2014-11-05: qty 2

## 2014-11-05 MED ORDER — PHENYLEPHRINE HCL 10 MG/ML IJ SOLN
10.0000 mg | INTRAVENOUS | Status: DC | PRN
  Administered 2014-11-05: 25 ug/min via INTRAVENOUS

## 2014-11-05 MED ORDER — PROPOFOL 10 MG/ML IV BOLUS
INTRAVENOUS | Status: AC
  Filled 2014-11-05: qty 20

## 2014-11-05 MED ORDER — LACTATED RINGERS IV SOLN
INTRAVENOUS | Status: DC | PRN
  Administered 2014-11-05 (×2): via INTRAVENOUS

## 2014-11-05 MED ORDER — HYDROMORPHONE HCL 1 MG/ML IJ SOLN
INTRAMUSCULAR | Status: AC
  Filled 2014-11-05: qty 1

## 2014-11-05 MED ORDER — VECURONIUM BROMIDE 10 MG IV SOLR
INTRAVENOUS | Status: DC | PRN
  Administered 2014-11-05: 1 mg via INTRAVENOUS

## 2014-11-05 MED ORDER — NEOSTIGMINE METHYLSULFATE 10 MG/10ML IV SOLN
INTRAVENOUS | Status: DC | PRN
  Administered 2014-11-05: 4 mg via INTRAVENOUS

## 2014-11-05 MED ORDER — PROPOFOL 10 MG/ML IV BOLUS
INTRAVENOUS | Status: DC | PRN
  Administered 2014-11-05: 120 mg via INTRAVENOUS
  Administered 2014-11-05: 40 mg via INTRAVENOUS
  Administered 2014-11-05: 10 mg via INTRAVENOUS

## 2014-11-05 MED ORDER — FENTANYL CITRATE 0.05 MG/ML IJ SOLN
INTRAMUSCULAR | Status: AC
  Filled 2014-11-05: qty 5

## 2014-11-05 MED ORDER — HYDROMORPHONE HCL 1 MG/ML IJ SOLN
0.2500 mg | INTRAMUSCULAR | Status: DC | PRN
  Administered 2014-11-05 (×2): 0.5 mg via INTRAVENOUS

## 2014-11-05 MED ORDER — ROCURONIUM BROMIDE 100 MG/10ML IV SOLN
INTRAVENOUS | Status: DC | PRN
  Administered 2014-11-05: 10 mg via INTRAVENOUS
  Administered 2014-11-05: 40 mg via INTRAVENOUS

## 2014-11-05 MED ORDER — CHLORHEXIDINE GLUCONATE 4 % EX LIQD
60.0000 mL | Freq: Once | CUTANEOUS | Status: DC
  Filled 2014-11-05: qty 60

## 2014-11-05 MED ORDER — EPHEDRINE SULFATE 50 MG/ML IJ SOLN
INTRAMUSCULAR | Status: AC
  Filled 2014-11-05: qty 1

## 2014-11-05 MED ORDER — ACETAMINOPHEN 650 MG RE SUPP: 650.0000 mg | RECTAL | Status: DC | PRN

## 2014-11-05 MED ORDER — GLYCOPYRROLATE 0.2 MG/ML IJ SOLN
INTRAMUSCULAR | Status: DC | PRN
  Administered 2014-11-05: 0.6 mg via INTRAVENOUS

## 2014-11-05 MED ORDER — ARTIFICIAL TEARS OP OINT
TOPICAL_OINTMENT | OPHTHALMIC | Status: AC
  Filled 2014-11-05: qty 3.5

## 2014-11-05 MED ORDER — PROMETHAZINE HCL 25 MG/ML IJ SOLN
6.2500 mg | INTRAMUSCULAR | Status: DC | PRN
  Administered 2014-11-05: 6.25 mg via INTRAVENOUS

## 2014-11-05 MED ORDER — SODIUM CHLORIDE 0.9 % IJ SOLN
INTRAMUSCULAR | Status: AC
  Filled 2014-11-05: qty 10

## 2014-11-05 MED ORDER — VECURONIUM BROMIDE 10 MG IV SOLR
INTRAVENOUS | Status: AC
  Filled 2014-11-05: qty 10

## 2014-11-05 MED ORDER — NEOSTIGMINE METHYLSULFATE 10 MG/10ML IV SOLN
INTRAVENOUS | Status: AC
  Filled 2014-11-05: qty 1

## 2014-11-05 MED ORDER — OXYCODONE HCL 5 MG PO TABS: 5.0000 mg | ORAL_TABLET | Freq: Four times a day (QID) | ORAL | Status: DC | PRN

## 2014-11-05 MED ORDER — LIDOCAINE HCL (CARDIAC) 20 MG/ML IV SOLN
INTRAVENOUS | Status: AC
  Filled 2014-11-05: qty 5

## 2014-11-05 MED ORDER — MIDAZOLAM HCL 2 MG/2ML IJ SOLN
INTRAMUSCULAR | Status: AC
  Filled 2014-11-05: qty 2

## 2014-11-05 MED ORDER — ONDANSETRON HCL 4 MG/2ML IJ SOLN
INTRAMUSCULAR | Status: DC | PRN
  Administered 2014-11-05: 4 mg via INTRAVENOUS

## 2014-11-05 MED ORDER — LIDOCAINE HCL (CARDIAC) 20 MG/ML IV SOLN
INTRAVENOUS | Status: DC | PRN
  Administered 2014-11-05: 70 mg via INTRAVENOUS

## 2014-11-05 MED ORDER — PHENYLEPHRINE 40 MCG/ML (10ML) SYRINGE FOR IV PUSH (FOR BLOOD PRESSURE SUPPORT)
PREFILLED_SYRINGE | INTRAVENOUS | Status: AC
  Filled 2014-11-05: qty 10

## 2014-11-05 MED ORDER — PROMETHAZINE HCL 25 MG/ML IJ SOLN
INTRAMUSCULAR | Status: AC
  Filled 2014-11-05: qty 1

## 2014-11-05 MED ORDER — FENTANYL CITRATE 0.05 MG/ML IJ SOLN
INTRAMUSCULAR | Status: DC | PRN
  Administered 2014-11-05: 100 ug via INTRAVENOUS
  Administered 2014-11-05: 25 ug via INTRAVENOUS
  Administered 2014-11-05: 50 ug via INTRAVENOUS

## 2014-11-05 MED ORDER — MIDAZOLAM HCL 5 MG/5ML IJ SOLN
INTRAMUSCULAR | Status: DC | PRN
  Administered 2014-11-05: 2 mg via INTRAVENOUS

## 2014-11-05 MED ORDER — PHENYLEPHRINE HCL 10 MG/ML IJ SOLN
INTRAMUSCULAR | Status: DC | PRN
  Administered 2014-11-05: 80 ug via INTRAVENOUS

## 2014-11-05 MED ORDER — 0.9 % SODIUM CHLORIDE (POUR BTL) OPTIME
TOPICAL | Status: DC | PRN
  Administered 2014-11-05 (×2): 1000 mL

## 2014-11-05 MED ORDER — GLYCOPYRROLATE 0.2 MG/ML IJ SOLN
INTRAMUSCULAR | Status: AC
  Filled 2014-11-05: qty 1

## 2014-11-05 MED ORDER — EPINEPHRINE HCL 1 MG/ML IJ SOLN
INTRAMUSCULAR | Status: AC
  Filled 2014-11-05: qty 1

## 2014-11-05 MED ORDER — ACETAMINOPHEN 325 MG PO TABS: 650.0000 mg | ORAL_TABLET | ORAL | Status: DC | PRN

## 2014-11-05 SURGICAL SUPPLY — 72 items
ADH SKN CLS APL DERMABOND .7 (GAUZE/BANDAGES/DRESSINGS) ×1
ADH SKN CLS LQ APL DERMABOND (GAUZE/BANDAGES/DRESSINGS) ×1
APPLIER CLIP LOGIC TI 5 (MISCELLANEOUS) IMPLANT
APR CLP MED LRG 33X5 (MISCELLANEOUS)
BALL CTTN LRG ABS STRL LF (GAUZE/BANDAGES/DRESSINGS)
BLADE SURG 15 STRL LF DISP TIS (BLADE) ×1 IMPLANT
BLADE SURG 15 STRL SS (BLADE) ×2
BRUSH CYTOL CELLEBRITY 1.5X140 (MISCELLANEOUS) IMPLANT
CANISTER SUCTION 2500CC (MISCELLANEOUS) ×4 IMPLANT
CLIP TI MEDIUM 6 (CLIP) IMPLANT
CONT SPEC 4OZ CLIKSEAL STRL BL (MISCELLANEOUS) ×7 IMPLANT
COTTONBALL LRG STERILE PKG (GAUZE/BANDAGES/DRESSINGS) IMPLANT
COVER SURGICAL LIGHT HANDLE (MISCELLANEOUS) ×4 IMPLANT
COVER TABLE BACK 60X90 (DRAPES) ×2 IMPLANT
DERMABOND ADHESIVE PROPEN (GAUZE/BANDAGES/DRESSINGS) ×1
DERMABOND ADVANCED (GAUZE/BANDAGES/DRESSINGS) ×1
DERMABOND ADVANCED .7 DNX12 (GAUZE/BANDAGES/DRESSINGS) ×1 IMPLANT
DERMABOND ADVANCED .7 DNX6 (GAUZE/BANDAGES/DRESSINGS) IMPLANT
DRAPE CHEST BREAST 15X10 FENES (DRAPES) ×2 IMPLANT
ELECT REM PT RETURN 9FT ADLT (ELECTROSURGICAL) ×2
ELECTRODE REM PT RTRN 9FT ADLT (ELECTROSURGICAL) ×1 IMPLANT
FILTER STRAW FLUID ASPIR (MISCELLANEOUS) IMPLANT
FORCEPS BIOP RJ4 1.8 (CUTTING FORCEPS) IMPLANT
GAUZE SPONGE 4X4 12PLY STRL (GAUZE/BANDAGES/DRESSINGS) ×3 IMPLANT
GAUZE SPONGE 4X4 16PLY XRAY LF (GAUZE/BANDAGES/DRESSINGS) ×2 IMPLANT
GLOVE BIOGEL PI IND STRL 6.5 (GLOVE) IMPLANT
GLOVE BIOGEL PI INDICATOR 6.5 (GLOVE) ×3
GLOVE ECLIPSE 6.5 STRL STRAW (GLOVE) ×3 IMPLANT
GLOVE SURG SIGNA 7.5 PF LTX (GLOVE) ×4 IMPLANT
GOWN STRL REUS W/ TWL LRG LVL3 (GOWN DISPOSABLE) ×1 IMPLANT
GOWN STRL REUS W/ TWL XL LVL3 (GOWN DISPOSABLE) ×2 IMPLANT
GOWN STRL REUS W/TWL LRG LVL3 (GOWN DISPOSABLE) ×2
GOWN STRL REUS W/TWL XL LVL3 (GOWN DISPOSABLE) ×4
HEMOSTAT SURGICEL 2X14 (HEMOSTASIS) IMPLANT
KIT BASIN OR (CUSTOM PROCEDURE TRAY) ×2 IMPLANT
KIT CLEAN ENDO COMPLIANCE (KITS) ×2 IMPLANT
KIT ROOM TURNOVER OR (KITS) ×4 IMPLANT
MARKER SKIN DUAL TIP RULER LAB (MISCELLANEOUS) ×2 IMPLANT
NDL BIOPSY TRANSBRONCH 21G (NEEDLE) IMPLANT
NDL BLUNT 18X1 FOR OR ONLY (NEEDLE) IMPLANT
NEEDLE 22X1 1/2 (OR ONLY) (NEEDLE) IMPLANT
NEEDLE BIOPSY TRANSBRONCH 21G (NEEDLE) IMPLANT
NEEDLE BLUNT 18X1 FOR OR ONLY (NEEDLE) IMPLANT
NEEDLE SYS SONOTIP II EBUSTBNA (NEEDLE) ×2 IMPLANT
NS IRRIG 1000ML POUR BTL (IV SOLUTION) ×4 IMPLANT
OIL SILICONE PENTAX (PARTS (SERVICE/REPAIRS)) ×1 IMPLANT
PACK SURGICAL SETUP 50X90 (CUSTOM PROCEDURE TRAY) ×2 IMPLANT
PAD ARMBOARD 7.5X6 YLW CONV (MISCELLANEOUS) ×8 IMPLANT
PENCIL BUTTON HOLSTER BLD 10FT (ELECTRODE) ×2 IMPLANT
SPONGE INTESTINAL PEANUT (DISPOSABLE) ×1 IMPLANT
SUT SILK 2 0 (SUTURE) ×2
SUT SILK 2-0 18XBRD TIE 12 (SUTURE) IMPLANT
SUT VIC AB 2-0 CT1 27 (SUTURE) ×2
SUT VIC AB 2-0 CT1 TAPERPNT 27 (SUTURE) IMPLANT
SUT VIC AB 3-0 SH 18 (SUTURE) IMPLANT
SUT VIC AB 3-0 SH 27 (SUTURE) ×2
SUT VIC AB 3-0 SH 27X BRD (SUTURE) ×1 IMPLANT
SUT VICRYL 4-0 PS2 18IN ABS (SUTURE) ×2 IMPLANT
SWAB COLLECTION DEVICE MRSA (MISCELLANEOUS) IMPLANT
SYR 20CC LL (SYRINGE) ×2 IMPLANT
SYR 20ML ECCENTRIC (SYRINGE) ×2 IMPLANT
SYR 5ML LL (SYRINGE) ×2 IMPLANT
SYR 5ML LUER SLIP (SYRINGE) ×2 IMPLANT
SYR CONTROL 10ML LL (SYRINGE) IMPLANT
SYRINGE 10CC LL (SYRINGE) ×2 IMPLANT
TOWEL OR 17X24 6PK STRL BLUE (TOWEL DISPOSABLE) ×4 IMPLANT
TOWEL OR 17X26 10 PK STRL BLUE (TOWEL DISPOSABLE) ×2 IMPLANT
TRAP SPECIMEN MUCOUS 40CC (MISCELLANEOUS) ×2 IMPLANT
TUBE ANAEROBIC SPECIMEN COL (MISCELLANEOUS) IMPLANT
TUBE CONNECTING 12X1/4 (SUCTIONS) ×2 IMPLANT
TUBE CONNECTING 20X1/4 (TUBING) ×2 IMPLANT
WATER STERILE IRR 1000ML POUR (IV SOLUTION) ×2 IMPLANT

## 2014-11-05 NOTE — Transfer of Care (Signed)
Immediate Anesthesia Transfer of Care Note  Patient: [name]  Procedure(s) Performed: Procedure(s): VIDEO BRONCHOSCOPY WITH ENDOBRONCHIAL ULTRASOUND (N/A)  MEDIASTINOSCOPY (N/A)  Patient Location: PACU  Anesthesia Type:General  Level of Consciousness: awake, alert  and oriented  Airway & Oxygen Therapy: Patient Spontanous Breathing and Patient connected to nasal cannula oxygen  Post-op Assessment: Report given to RN, Post -op Vital signs reviewed and stable and Patient moving all extremities X 4  Post vital signs: Reviewed and stable  Last Vitals:  Filed Vitals:   11/05/14 0603  BP:   Pulse:   Temp: 84.1 C    Complications: No apparent anesthesia complications

## 2014-11-05 NOTE — Telephone Encounter (Signed)
Called to set up appt for thoracic clinic on 11/16/14.  I left vm message to call with my name and phone number.

## 2014-11-05 NOTE — Discharge Instructions (Signed)
Do not drive or engage in heavy physical activity for 24 hours.  You may shower tomorrow.  You may resume driving when no longer taking pain medication during daytime hours.  You have a prescription for oxycodone, a narcotic, use as directed as needed for pain.  You may use tylenol or ibuprofen in addition to, or instead of, the oxycodone.  You may cough up small amounts of blood over the next few days.  Resume plavix and lovenox tomorrow. Resume the remainder of your medications today.  My office will contact you with follow up information.  Call [phone] if you have fever > 101, cough up large amounts of blood (> 2 tbsp), notice redness around the incision, excessive swelling or drainage from the incision.

## 2014-11-05 NOTE — Anesthesia Procedure Notes (Signed)
Procedure Name: Intubation Date/Time: 11/05/2014 7:37 AM Performed by: Merrilyn Puma B Pre-anesthesia Checklist: Timeout performed, Emergency Drugs available, Patient identified, Suction available and Patient being monitored Patient Re-evaluated:Patient Re-evaluated prior to inductionOxygen Delivery Method: Circle system utilized Preoxygenation: Pre-oxygenation with 100% oxygen Intubation Type: IV induction Ventilation: Mask ventilation without difficulty and Oral airway inserted - appropriate to patient size Laryngoscope Size: Mac and 4 Grade View: Grade I Tube type: Oral Tube size: 8.5 mm Number of attempts: 1 Airway Equipment and Method: Stylet Placement Confirmation: CO2 detector,  positive ETCO2,  ETT inserted through vocal cords under direct vision and breath sounds checked- equal and bilateral Secured at: 23 cm Tube secured with: Tape Dental Injury: Teeth and Oropharynx as per pre-operative assessment

## 2014-11-05 NOTE — Telephone Encounter (Signed)
Called patient with appt for thoracic clinic on 11/15/14 arrive at 1:30.  I spoke to wife and she verbalized understanding of appt time and place.

## 2014-11-05 NOTE — Interval H&P Note (Signed)
History and Physical Interval Note:  11/05/2014 7:20 AM  [name]  has presented today for surgery, with the diagnosis of LUL MASS MEDIASTINAL ADENOPATHY  The various methods of treatment have been discussed with the patient and family. After consideration of risks, benefits and other options for treatment, the patient has consented to  Procedure(s): VIDEO BRONCHOSCOPY WITH ENDOBRONCHIAL ULTRASOUND (N/A) POSSIBLE MEDIASTINOSCOPY (N/A) as a surgical intervention .  The patient's history has been reviewed, patient examined, no change in status, stable for surgery.  I have reviewed the patient's chart and labs.  Questions were answered to the patient's satisfaction.     [name]

## 2014-11-05 NOTE — Brief Op Note (Signed)
11/05/2014  9:51 AM  PATIENT:  [name]  54 y.o. male  PRE-OPERATIVE DIAGNOSIS:  1. LUL MASS 2. MEDIASTINAL ADENOPATHY  POST-OPERATIVE DIAGNOSIS:  1. LUL MASS 2. MEDIASTINAL ADENOPATHY  PROCEDURE:  Procedure(s): VIDEO BRONCHOSCOPY WITH ENDOBRONCHIAL ULTRASOUND (N/A)  MEDIASTINOSCOPY (N/A)  SURGEON:  Surgeon(s) and Role:    * [name], MD - Primary  ANESTHESIA:   general  EBL:  Total I/O In: 1000 [I.V.:1000] Out: 10 [Blood:10]  BLOOD ADMINISTERED:none  DRAINS: none   LOCAL MEDICATIONS USED:  NONE  SPECIMEN:  Source of Specimen:  mediastinal lymph nodes  DISPOSITION OF SPECIMEN:  PATHOLOGY  COUNTS:  YES  PLAN OF CARE: Discharge to home after PACU  PATIENT DISPOSITION:  PACU - hemodynamically stable.   Delay start of Pharmacological VTE agent (>24hrs) due to surgical blood loss or risk of bleeding: yes  Malignant cells in aspirates from level 7 and 4R nodes. Mediastinoscopy performed to ensure adequate tissue for diagnosis and molecular testing

## 2014-11-05 NOTE — Anesthesia Postprocedure Evaluation (Signed)
  Anesthesia Post-op Note  Patient: [name]  Procedure(s) Performed: Procedure(s): VIDEO BRONCHOSCOPY WITH ENDOBRONCHIAL ULTRASOUND (N/A)  MEDIASTINOSCOPY (N/A)  Patient Location: PACU  Anesthesia Type:General  Level of Consciousness: awake  Airway and Oxygen Therapy: Patient Spontanous Breathing  Post-op Pain: mild  Post-op Assessment: Post-op Vital signs reviewed  Post-op Vital Signs: Reviewed  Last Vitals:  Filed Vitals:   11/05/14 0950  BP: 136/75  Pulse: 67  Temp:   Resp: 16    Complications: No apparent anesthesia complications

## 2014-11-05 NOTE — Anesthesia Preprocedure Evaluation (Addendum)
Anesthesia Evaluation  Patient identified by MRN, date of birth, ID band Patient awake    Reviewed: Allergy & Precautions, NPO status , Patient's Chart, lab work & pertinent test results, reviewed documented beta blocker date and time   Airway Mallampati: II  TM Distance: >3 FB Neck ROM: Full    Dental  (+) Dental Advisory Given   Pulmonary Current Smoker,  breath sounds clear to auscultation        Cardiovascular hypertension, Pt. on medications and Pt. on home beta blockers + angina + CAD, + Past MI and + Cardiac Stents (05/2014) Rhythm:Regular     Neuro/Psych    GI/Hepatic Neg liver ROS, GERD-  ,  Endo/Other    Renal/GU negative Renal ROS     Musculoskeletal   Abdominal   Peds  Hematology   Anesthesia Other Findings   Reproductive/Obstetrics                            Anesthesia Physical Anesthesia Plan  ASA: III  Anesthesia Plan: General   Post-op Pain Management:    Induction: Intravenous  Airway Management Planned:   Additional Equipment: Arterial line  Intra-op Plan:   Post-operative Plan: Extubation in OR  Informed Consent: I have reviewed the patients History and Physical, chart, labs and discussed the procedure including the risks, benefits and alternatives for the proposed anesthesia with the patient or authorized representative who has indicated his/her understanding and acceptance.   Dental advisory given  Plan Discussed with: Anesthesiologist, Surgeon and CRNA  Anesthesia Plan Comments:        Anesthesia Quick Evaluation

## 2014-11-05 NOTE — H&P (View-Only) (Signed)
PCP is [name], MD Referring Provider is Tanda Rockers, MD  Chief Complaint  Patient presents with  . Lung Lesion    CT CHEST 09/21/14, MRI BRAIN 09/22/14. PET 10/12/14...office spiromety only  . Adenopathy    HPI: Mr. Yoss is a 54 year old gentleman sent for consultation regarding a left hilar mass with mediastinal adenopathy.  Mr. Guzzetta is a 54 year old gentleman with a history of tobacco abuse (one pack per day for 39 years), coronary artery disease with 2 previous MIs, both treated with stents, hypertension, hyperlipidemia, and recently diagnosed pulmonary embolism.  He was in his usual state of health until late February. He had noticed that he was having swelling in his legs, primarily in the feet and ankles. This was worse on the left than the right. He says that while he was waiting to have a duplex done to rule out DVT he developed a headache and was sent to the emergency room. According to his ER records he developed left shoulder pain associated with nausea and vomiting and that led to him being sent to the emergency room. Regardless on arrival to the emergency room a CT of the chest with contrast was done. It showed an acute pulmonary embolus and a right upper lobe pulmonary artery branch. There was a left hilar mass associated with left hilar, paratracheal, and aortopulmonary window adenopathy. His troponin was mildly elevated.  He was treated with Lovenox for his already embolus.  He was referred to Dr. Melvyn Novas. A PET CT was done which showed the left hilar mass, AP window, paratracheal, subcarinal and left hilar nodes were hypermetabolic. There also was a small hypermetabolic left supraclavicular node.  Mr. Keilman smoked about a pack of cigarettes daily for 39 years. He says he has not smoked cigarettes since Friday. He denies cough, wheezing, fevers, chills, night sweats, weight loss, change in appetite. He does have migraine headaches. He had a negative brain MR during his  workup. He complains of pain in his legs with walking. He has gained a few pounds over the past 6 months. He does bleed and bruise easily since she's been on Plavix.  Zubrod Score: At the time of surgery this patient's most appropriate activity status/level should be described as: [x]     0    Normal activity, no symptoms []     1    Restricted in physical strenuous activity but ambulatory, able to do out light work []     2    Ambulatory and capable of self care, unable to do work activities, up and about >50 % of waking hours                              []     3    Only limited self care, in bed greater than 50% of waking hours []     4    Completely disabled, no self care, confined to bed or chair []     5    Moribund     Past Medical History  Diagnosis Date  . HLD (hyperlipidemia)   . HTN (hypertension)     "took me off my BP pills" (06/20/2014)  . GERD (gastroesophageal reflux disease)     hx (06/20/2014)  . History of gout   . Coronary artery disease     a. s/p Taxus DES to CFX 8/06 in setting of NSTEMI;  b.  s/p Inf STEMI 06/21/11:  mRCA occluded>> PCI:  Overlapping Promus DES x 2 to RCA.;  c.  LHC (11/15):  pLAD 20-30%, oDx branches 50-60%, mCFX stent 95% ISR, dCFX stent ok, mRCA stent occluded with L-R collats, EF 50-55%, basal inf AK, mid Inf HK >> PCI:  3.25 x 23 mm Xience DES to [place]  . Myocardial infarction 02/2005; 05/2011  . Pulmonary embolism 09/21/2014    Past Surgical History  Procedure Laterality Date  . Left heart cath N/A 06/21/2011    Procedure: LEFT HEART CATH;  Surgeon: [name], MD;  Location: [facility] CATH LAB;  Service: Cardiovascular;  Laterality: N/A;  . Left heart catheterization with coronary angiogram N/A 06/20/2014    Procedure: LEFT HEART CATHETERIZATION WITH CORONARY ANGIOGRAM;  Surgeon: Peter M Martinique, MD;  Location: [facility] CATH LAB;  Service: Cardiovascular;  Laterality: N/A;  . Coronary angioplasty with stent placement  02/2005; 05/2011; 06/20/2014    "1;  ?2; 1"    Family History  Problem Relation Age of Onset  . Hypertension Father     Social History History  Substance Use Topics  . Smoking status: Current Every Day Smoker -- 1.00 packs/day for 39 years    Types: Cigarettes  . Smokeless tobacco: Never Used     Comment: pt has not smoked in 2 days.  . Alcohol Use: No    Current Outpatient Prescriptions  Medication Sig Dispense Refill  . aspirin 81 MG tablet Take 81 mg by mouth daily.    [name] atorvastatin (LIPITOR) 80 MG tablet TAKE ONE TABLET BY MOUTH ONCE DAILY 30 tablet 5  . clopidogrel (PLAVIX) 75 MG tablet TAKE ONE TABLET BY MOUTH ONCE DAILY 30 tablet 5  . enoxaparin (LOVENOX) 100 MG/ML injection Inject 1 mL (100 mg total) into the skin daily. 30 Syringe 2  . metoprolol tartrate (LOPRESSOR) 25 MG tablet TAKE ONE-HALF TABLET BY MOUTH TWICE DAILY 30 tablet 5  . nitroGLYCERIN (NITROSTAT) 0.4 MG SL tablet Place 1 tablet (0.4 mg total) under the tongue every 5 (five) minutes as needed for chest pain. 25 tablet 11  . sildenafil (VIAGRA) 50 MG tablet Take 50 mg by mouth daily as needed for erectile dysfunction.     No current facility-administered medications for this visit.    No Known Allergies  Review of Systems  Constitutional: Positive for unexpected weight change (has gained a few pounds in last 6 months). Negative for fever, chills, activity change and appetite change.  HENT: Positive for dental problem.   Respiratory: Negative for cough, shortness of breath and wheezing.   Cardiovascular: Positive for chest pain (left subscapular) and leg swelling.       Pain in legs with walking  Neurological: Positive for headaches (migraines).  Hematological: Negative for adenopathy. Bruises/bleeds easily.  All other systems reviewed and are negative.   BP 128/86 mmHg  Pulse 74  Resp 16  Ht 5\' 9"  (1.753 m)  Wt 164 lb (74.39 kg)  BMI 24.21 kg/m2  SpO2 98% Physical Exam  Constitutional: He is oriented to person, place, and time.  He appears well-developed and well-nourished. No distress.  HENT:  Head: Normocephalic and atraumatic.  Eyes: EOM are normal. Pupils are equal, round, and reactive to light.  Neck: Neck supple. No thyromegaly present.  Cardiovascular: Normal rate, regular rhythm, normal heart sounds and intact distal pulses.  Exam reveals no gallop and no friction rub.   No murmur heard. Pulmonary/Chest: Effort normal and breath sounds normal. He has no wheezes.  Abdominal: Soft. There is no tenderness.  Musculoskeletal: He  exhibits no edema.  Lymphadenopathy:    He has cervical adenopathy (questionably palpable left supraclavicular node).  Neurological: He is alert and oriented to person, place, and time. No cranial nerve deficit.  No focal motor deficit  Skin: Skin is warm and dry.  Psychiatric: He has a normal mood and affect.  Vitals reviewed.    Diagnostic Tests: CT CHEST WITH CONTRAST  TECHNIQUE: Multidetector CT imaging of the chest was performed during intravenous contrast administration.  CONTRAST: 137mL OMNIPAQUE IOHEXOL 300 MG/ML SOLN  COMPARISON: Chest radiograph of same day.  FINDINGS: No pneumothorax or plural effusion is noted. Right lung is clear. 2.3 x 1.6 cm spiculated abnormality is noted in left suprahilar region concerning for malignancy. Mild biapical scarring is noted. 8 mm spiculated density is noted in left lung apex concerning for malignancy. Left hilar adenopathy is noted measure 3.1 x 1.9 cm. Adenopathy is also noted in the aortopulmonary window with the largest measuring 2.3 x 1.4 cm. Right peritracheal lymph node measuring 12 x 9 mm is noted.  There is no evidence of thoracic aortic dissection or aneurysm. Filling defect is seen in upper lobe branch of right pulmonary artery consistent with pulmonary embolus. Visualized portion of upper abdomen appears normal. No significant osseous abnormality is noted in the chest.  IMPRESSION: 2.3 x 1.6 cm  spiculated density seen in left suprahilar region concerning for malignancy. Immediately superior to this, 8 mm spiculated abnormality is noted in left lung apex concerning for malignancy is well. Left hilar, aorticopulmonary window and right peritracheal adenopathy is noted consistent with metastatic disease.  Acute pulmonary embolus seen in upper lobe branch of right pulmonary artery. Critical Value/emergent results were called by telephone at the time of interpretation on 09/21/2014 at 4:35 pm to Dr. Glendell Docker , who verbally acknowledged these results.   Electronically Signed  By: Marijo Conception, M.D.  On: 09/21/2014 16:36 NUCLEAR MEDICINE PET SKULL BASE TO THIGH  TECHNIQUE: 9.8 mCi F-18 FDG was injected intravenously. Full-ring PET imaging was performed from the skull base to thigh after the radiotracer. CT data was obtained and used for attenuation correction and anatomic localization.  FASTING BLOOD GLUCOSE: Value: 97 mg/dl  COMPARISON: 09/21/2014  FINDINGS: NECK  Left supraclavicular lymph node measures 8 mm and has an SUV max equal to 3.8. Lymph node posterior to right lobe of thyroid gland measures 1 cm. This has an SUV max equal to 3.8.  CHEST  There are hypermetabolic bilateral mediastinal lymph nodes. Right paratracheal lymph node measures 8 mm and has an SUV max equal to 6.14. Hypermetabolic sub- carinal lymph node measures 1.1 cm and has an SUV max equal to 6.7. Hypermetabolic left hilar lymph node measures 3.2 cm and has an SUV max equal to 5.9. Within the left upper lobe there is a central perihilar lesion which measures 2.3 x 1.6 x 2.5 cm and has an SUV max equal to 10.2. Within the left apex there is a small satellite nodule measuring 8 mm. This has an SUV max equal to 2.5.  ABDOMEN/PELVIS  No abnormal hypermetabolic activity within the liver, pancreas, adrenal glands, or spleen. There is calcified atherosclerotic disease  involving the abdominal aorta and its branches. No aneurysm. No hypermetabolic lymph nodes in the abdomen or pelvis.  SKELETON  No focal hypermetabolic activity to suggest skeletal metastasis.  IMPRESSION: 1. Left upper lobe pulmonary nodule exhibits malignant range FDG uptake and is worrisome for primary bronchogenic carcinoma. Assuming non-small cell histology this is consistent with at  least a T3N3M0 or Stage IIIB   Electronically Signed  By: Kerby Moors M.D.  On: 10/02/2014 16:30   Impression: Mr. Bankson is a 54 year old man with a history of tobacco abuse who recently was found to have an acute pulmonary embolus by CT, which is being treated with Lovenox. On the same CT that diagnosis PE he was found to have a left hilar mass with extensive hilar and mediastinal adenopathy. A PET CT showed the left hilar mass, hilar and mediastinal lymph nodes, and a left clavicular node were all hypermetabolic. 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  I personally reviewed the CT and PET CT and also reviewed the films with Mr. and Mrs. Hisaw.  We discussed the differential diagnosis. They understand that this most likely represents an advanced stage lung cancer and must be considered such unless it can be proven otherwise.  Clinically he has stage IIIB disease so is not a candidate for surgical resection.  The first order of business is to obtain a biopsy for diagnostic and staging purposes, so that appropriate treatment can be initiated. Unfortunately, this is complicated by his recent acute pulmonary embolism and pre-existing coronary stents. He currently is on aspirin, Lovenox, and Plavix.  He will need to hold Lovenox for a day and a half prior to biopsy. He will need to hold the Plavix for 4-5 days. That will need to be discussed with cardiology.  I think the best initial approach for him would be to do a bronchoscopy and endobronchial  ultrasound. More than likely that would provide adequate diagnosis, staging, and provide adequate tissue for molecular testing. If nondiagnostic and mediastinal endoscopy would certainly work, and it might be best if that was scheduled to be done at the same setting if the initial samples were negative.  I did discuss the general nature of bronchoscopy and endobronchial ultrasound with Mr. and Mrs. Cervenka. They understand that this is a diagnostic and therapeutic procedure. They understand it would be done in the operating room under general anesthesia. They are aware of the general conduct of the procedure. They also are aware of the decision-making that would go into whether to proceed with a mediastinal endoscopy at the same setting. I reviewed the indications, risks, benefits, and alternatives. They understand that the risks include, but are not limited to bleeding, pneumothorax, and failure to make a diagnosis. They understand that with mediastinal endoscopy there is risk of bleeding, recurrent nerve injury, stroke, esophageal injury, pneumothorax. They also understand that there is a risk of other unforeseeable complications with any invasive procedure.  Plan: I'll discuss arranging bronchoscopy and endobronchial ultrasound with Dr. Melvyn Novas  Given the complicated nature of managing his anticoagulation, it may be best to be able to do everything at one setting, to avoid going on and off anticoagulation.    [name], MD [facility] [phone]

## 2014-11-06 ENCOUNTER — Encounter (HOSPITAL_COMMUNITY): Payer: Self-pay | Admitting: Thoracic Surgery (Cardiothoracic Vascular Surgery)

## 2014-11-06 NOTE — Op Note (Signed)
NAMEORAN, DILLENBURG NO.:  [id number]  MEDICAL RECORD NO.:  [id number]  LOCATION:  [facility]                         FACILITY:  [facility]  PHYSICIAN:  Revonda Standard. Roxan Hockey, M.D.DATE OF BIRTH:  [birth year]  DATE OF PROCEDURE:  11/05/2014 DATE OF DISCHARGE:  11/05/2014                              OPERATIVE REPORT   PREOPERATIVE DIAGNOSIS:  Left upper lobe mass with mediastinal adenopathy.  POSTOPERATIVE DIAGNOSIS:  Left upper lobe mass with mediastinal adenopathy, malignant cells present.  PROCEDURE:    Video bronchoscopy Endobronchial ultrasound with mediastinal lymph node aspirations Mediastinoscopy.  SURGEON:  Revonda Standard. Roxan Hockey, MD  ASSISTANT:  None.  ANESTHESIA:  General.  FINDINGS:  Extrinsic narrowing of left upper lobe segmental bronchi with no endobronchial mass seen. Markedly enlarged lymph nodes positive for malignant cells on aspiration.  CLINICAL NOTE:  Mr. Potempa is a 54 year old gentleman with a history of tobacco abuse, coronary artery disease and a recently diagnosed pulmonary embolus. He recently was ill and had a CT which showed an acute pulmonary embolus and also a left hilar mass with left hilar, paratracheal and aortic pulmonary window adenopathy.  A PET-CT showed these lesions were hypermetabolic.  He was advised to undergo bronchoscopy and endobronchial ultrasound and possible mediastinoscopy depending on the findings of endobronchial ultrasound.  The working diagnosis was stage IIIB lung cancer, but he needs biopsy for definitive diagnosis.  The indications, risks, benefits, and alternatives were discussed in detail with the patient.  He accepted the risks and agreed to proceed.  OPERATIVE NOTE:  Mr. Sime was brought to the operating room on November 05, 2014.  In the preoperative holding area, he had an arterial blood pressure monitoring line placed in the right radial artery.  He was given intravenous antibiotics.  In the OR he was  anesthetized and intubated.  Flexible fiberoptic bronchoscopy was performed via the endotracheal tube.  The trachea and right bronchial tree appeared normal to the level of the subsegmental bronchi.  The left mainstem bronchus was normal. There was extrinsic compression of the segmental bronchi of the left upper lobe, but no endobronchial mass was seen to the level of the subsegmental bronchi.  The bronchoscope was withdrawn.  The endobronchial ultrasound probe then was advanced and systematic inspection of the mediastinal lymph node stations was carried out. There were multiple enlarged nodes.  Aspirations were performed of a 4R node and a massive level 7 subcarinal node.  With each aspiration, the needle was advanced into the lymph node with ultrasound visualization. Suction was applied and 10-12 passes with the needle were performed. The initial aspirations of each of these nodes was placed on the slides and sent for quick prep.  Multiple additional aspirations were performed of both nodes and the remaining specimens were placed into cytologic solution for cell block. The EBUS scope was removed.  The quick prep of the initial slides showed malignant cells.  Pathology could not differentiate between possible non-small cell carcinoma or, potentially, melanoma.  Because of the advanced stage of disease, the need for adequate tissue for molecular testing and concern that all the cell block would need to be used for diagnostic purposes, the decision  was made to proceed with mediastinoscopy.  The neck and chest were prepped and draped in the usual sterile fashion. A transverse incision was made 1 fingerbreadth above the sternal notch. The incision was carried through the skin and subcutaneous tissue. Hemostasis was achieved with electrocautery. The pretracheal fascia was identified and incised and the pretracheal plane was developed bluntly into the mediastinum.  The mediastinoscope was  inserted and dissection was carried down to the subcarinal nodes.  There were multiple enlarged, grossly cancerous nodes in the subcarinal space.  Multiple biopsies were performed, taking samples from several different nodes.  These were all sent for permanent Pathology.  After performing the biopsies, the wound was packed with gauze.  After 5 minutes, the packing was removed.  The mediastinoscope was reinserted.  There was good hemostasis.  The mediastinoscope was withdrawn.  The incision was closed in 2 layers with a 3-0 Vicryl subcutaneous suture followed by a 4-0 Vicryl subcuticular suture.  Dermabond was applied to the skin.  The patient was extubated in the operating room and taken to the postanesthetic care unit in good condition.     Revonda Standard Roxan Hockey, M.D.     SCH/MEDQ  D:  11/05/2014  T:  11/06/2014  Job:  [id number]

## 2014-11-08 ENCOUNTER — Telehealth: Payer: Self-pay | Admitting: Thoracic Surgery (Cardiothoracic Vascular Surgery)

## 2014-11-08 NOTE — Telephone Encounter (Signed)
Patient had called for path results  I called him back and informed him of the results- poorly differentiated non-small cell- Stage IIIB  Will need chemo +/- radiation  He has an appointment with Dr. Julien Nordmann next week

## 2014-11-12 ENCOUNTER — Other Ambulatory Visit: Payer: Self-pay | Admitting: Family Medicine

## 2014-11-12 ENCOUNTER — Ambulatory Visit (INDEPENDENT_AMBULATORY_CARE_PROVIDER_SITE_OTHER): Payer: 59 | Admitting: Thoracic Surgery (Cardiothoracic Vascular Surgery)

## 2014-11-12 ENCOUNTER — Ambulatory Visit
Admission: RE | Admit: 2014-11-12 | Discharge: 2014-11-12 | Disposition: A | Discharge status: Home / Self Care | Payer: 59 | Source: Ambulatory Visit | Attending: Family Medicine | Admitting: Family Medicine

## 2014-11-12 VITALS — BP 137/88 | HR 100 | Resp 20

## 2014-11-12 DIAGNOSIS — R0600 Dyspnea, unspecified: Secondary | ICD-10-CM

## 2014-11-12 DIAGNOSIS — T888XXA Other specified complications of surgical and medical care, not elsewhere classified, initial encounter: Secondary | ICD-10-CM

## 2014-11-12 DIAGNOSIS — T792XXA Traumatic secondary and recurrent hemorrhage and seroma, initial encounter: Secondary | ICD-10-CM

## 2014-11-12 NOTE — Progress Notes (Signed)
      [address]       [place] [address]             [phone]       HPI:  [name] walk in this afternoon with a complaint of swelling at his incision.  He is a 54 year old man who had a mediastinoscopy last week. He says he noticed swelling in his incision. He's not having much discomfort there. He's not had any drainage from the incision. He also has not noticed any significant redness around the incision.  He complains that when he is falling asleep his whole body jerks and he feels like he is unable to catch his breath. This causes anxiety.   Current Outpatient Prescriptions  Medication Sig Dispense Refill  . aspirin 81 MG tablet Take 81 mg by mouth daily.    [name] atorvastatin (LIPITOR) 80 MG tablet TAKE ONE TABLET BY MOUTH ONCE DAILY 30 tablet 5  . clopidogrel (PLAVIX) 75 MG tablet TAKE ONE TABLET BY MOUTH ONCE DAILY 30 tablet 5  . enoxaparin (LOVENOX) 100 MG/ML injection Inject 1 mL (100 mg total) into the skin daily. 30 Syringe 2  . metoprolol tartrate (LOPRESSOR) 25 MG tablet TAKE ONE-HALF TABLET BY MOUTH TWICE DAILY 30 tablet 5  . nitroGLYCERIN (NITROSTAT) 0.4 MG SL tablet Place 1 tablet (0.4 mg total) under the tongue every 5 (five) minutes as needed for chest pain. 25 tablet 11  . oxyCODONE (OXY IR/ROXICODONE) 5 MG immediate release tablet Take 1-2 tablets (5-10 mg total) by mouth 4 (four) times daily as needed (pain). 30 tablet 0  . sildenafil (VIAGRA) 50 MG tablet Take 50 mg by mouth daily as needed for erectile dysfunction.     No current facility-administered medications for this visit.    Physical Exam BP 137/88 mmHg  Pulse 100  Resp 20  SpO54 78% 54 year old man in no acute distress Alert and oriented 3 Neck incision with swelling but no significant erythema and no drainage, wound is intact  Diagnostic Tests: His chest x-ray was reviewed and shows a left hilar mass unchanged from previously. The official report says increased right  paratracheal adenopathy. I suspect this is likely some bleeding/ fluid in the operative site.  Impression: Wound seroma following mediastinoscopy. It does not look infected, so I'm not going to start him on antibiotics at this point. This should resolve on its own, but if it does not can always aspirate it.  In regards to his sleep disturbance, I think that it is primarily anxiety. He can try taking 1 or 2 pain pills before he goes to bed to see if that helps or also might consider using Tylenol PM.  Plan: He has no point see Dr. Julien Nordmann on Thursday. I will be in [place] that day, so I will see him as well.  He knows to call if he develops increasing pain, redness, tenderness, drainage from the incision, or fever. [name], MD [facility] [phone]

## 2014-11-14 ENCOUNTER — Telehealth: Payer: Self-pay | Admitting: *Deleted

## 2014-11-14 ENCOUNTER — Other Ambulatory Visit: Payer: Self-pay | Admitting: *Deleted

## 2014-11-14 DIAGNOSIS — R918 Other nonspecific abnormal finding of lung field: Secondary | ICD-10-CM

## 2014-11-14 NOTE — Progress Notes (Signed)
  Radiation Oncology         [phone]) [phone] ________________________________  Initial outpatient Consultation - Date: 11/15/2014   Name: [name] MRN: [id number]   DOB: [birth year]  REFERRING PHYSICIAN: [name], *  DIAGNOSIS: Stage III NSCLC  HISTORY OF PRESENT ILLNESS::[name] is a 54 y.o. male  [name] is a 54 year old gentleman who presented to the ER with left shoulder pain. A CT of the chest with contrast was done. It showed an acute pulmonary embolus and a left hilar mass associated with left hilar, paratracheal, and aortopulmonary window adenopathy. He was referred to Dr. Melvyn Novas. A PET CT was done which showed the left hilar mass, AP window, paratracheal, subcarinal and left hilar nodes were hypermetabolic. There also was a small hypermetabolic left supraclavicular node. The left supraclavicular node was biopsied but was non diagnostic.  A mediastinoscopy was then performed which showed poorly differentiated adenocarcinoma in a 7and 4R lymph node. He had a brain MRI on 09/22/14 was negative for metastatic disease.  [name] smoked about a pack of cigarettes daily for 39 years. He says he has not smoked cigarettes since Friday. He denies cough, wheezing, fevers, chills, night sweats, weight loss, change in appetite. He does have migraine headaches. He had a negative brain MR during his workup. He complains of pain in his legs with walking. He has gained a few pounds over the past 6 months.   He was referred to me by Dr. Roxan Hockey for my opinion regarding radiation in the management of his disease. He is accompanied by his wife.  He is self employed.   PREVIOUS RADIATION THERAPY: No  Past medical, social and family history were reviewed in the electronic chart. Review of symptoms was reviewed in the electronic chart. Medications were reviewed in the electronic chart.   PHYSICAL EXAM:  Filed Vitals:   11/15/14 1400  BP: 131/77  Pulse: 85  Temp: 98.3 F (36.8 C)    Resp: 18  .158 lb 12.8 oz (72.031 kg). Pleasant male. No distress. Normal respiratory effort  IMPRESSION: Stage III NSCLC  PLAN:  We discussed his diagnosis and stage. We discussed definitive chemoradiation in the management of his disease. We discussed 6 weeks of treatment as an outpatient. We discussed the process of simulation and the placement of tattoos. We discussed dysphagia, weight loss and fatigue as the acute side effects of radiation. We discussed damage to critical normal structures in the chest as well as the spinal cord as possible but improbably. We will schedule him for sim next week  and begin treatments as soon as possible. I encouraged him to start eating and gaining weight.    I spent 40 minutes  face to face with the patient and more than 50% of that time was spent in counseling and/or coordination of care.  This document serves as a record of services personally performed by Thea Silversmith , MD. It was created on her behalf by Pearlie Oyster, a trained medical scribe. The creation of this record is based on the scribe's personal observations and the provider's statements to them. This document has been checked and approved by the attending provider.      ------------------------------------------------  Thea Silversmith, MD

## 2014-11-14 NOTE — Telephone Encounter (Signed)
Called and confirmed 11/15/14 clinic appt w/ pt.

## 2014-11-15 ENCOUNTER — Ambulatory Visit: Payer: 59 | Attending: Internal Medicine | Admitting: Physical Therapy

## 2014-11-15 ENCOUNTER — Encounter: Payer: Self-pay | Admitting: *Deleted

## 2014-11-15 ENCOUNTER — Other Ambulatory Visit (HOSPITAL_BASED_OUTPATIENT_CLINIC_OR_DEPARTMENT_OTHER): Payer: 59

## 2014-11-15 ENCOUNTER — Encounter: Payer: Self-pay | Admitting: Internal Medicine

## 2014-11-15 ENCOUNTER — Other Ambulatory Visit (HOSPITAL_COMMUNITY)
Admission: RE | Admit: 2014-11-15 | Discharge: 2014-11-15 | Disposition: A | Discharge status: Home / Self Care | Payer: 59 | Source: Ambulatory Visit | Attending: Internal Medicine | Admitting: Internal Medicine

## 2014-11-15 ENCOUNTER — Ambulatory Visit (HOSPITAL_BASED_OUTPATIENT_CLINIC_OR_DEPARTMENT_OTHER): Payer: 59 | Admitting: Internal Medicine

## 2014-11-15 ENCOUNTER — Ambulatory Visit
Admission: RE | Admit: 2014-11-15 | Discharge: 2014-11-15 | Disposition: A | Discharge status: Home / Self Care | Payer: 59 | Source: Ambulatory Visit | Attending: Radiation Oncology | Admitting: Radiation Oncology

## 2014-11-15 VITALS — BP 131/77 | HR 85 | Temp 98.3°F | Resp 18 | Wt 158.8 lb

## 2014-11-15 VITALS — BP 131/77 | HR 85 | Temp 98.3°F | Resp 18 | Ht 69.0 in | Wt 158.8 lb

## 2014-11-15 DIAGNOSIS — Z720 Tobacco use: Secondary | ICD-10-CM | POA: Insufficient documentation

## 2014-11-15 DIAGNOSIS — C771 Secondary and unspecified malignant neoplasm of intrathoracic lymph nodes: Secondary | ICD-10-CM

## 2014-11-15 DIAGNOSIS — C3412 Malignant neoplasm of upper lobe, left bronchus or lung: Secondary | ICD-10-CM

## 2014-11-15 DIAGNOSIS — C3492 Malignant neoplasm of unspecified part of left bronchus or lung: Secondary | ICD-10-CM

## 2014-11-15 DIAGNOSIS — C781 Secondary malignant neoplasm of mediastinum: Secondary | ICD-10-CM | POA: Diagnosis not present

## 2014-11-15 DIAGNOSIS — R918 Other nonspecific abnormal finding of lung field: Secondary | ICD-10-CM

## 2014-11-15 DIAGNOSIS — C3490 Malignant neoplasm of unspecified part of unspecified bronchus or lung: Secondary | ICD-10-CM | POA: Diagnosis not present

## 2014-11-15 DIAGNOSIS — R5381 Other malaise: Secondary | ICD-10-CM | POA: Diagnosis present

## 2014-11-15 LAB — COMPREHENSIVE METABOLIC PANEL (CC13)
ALK PHOS: 108 U/L (ref 40–150)
ALT: 17 U/L (ref 0–55)
ANION GAP: 16 meq/L — AB (ref 3–11)
AST: 13 U/L (ref 5–34)
Albumin: 3.6 g/dL (ref 3.5–5.0)
BILIRUBIN TOTAL: 0.3 mg/dL (ref 0.20–1.20)
BUN: 12.8 mg/dL (ref 7.0–26.0)
CO2: 20 mEq/L — ABNORMAL LOW (ref 22–29)
Calcium: 9.5 mg/dL (ref 8.4–10.4)
Chloride: 108 mEq/L (ref 98–109)
Creatinine: 0.8 mg/dL (ref 0.7–1.3)
Glucose: 130 mg/dl (ref 70–140)
Potassium: 3.6 mEq/L (ref 3.5–5.1)
SODIUM: 144 meq/L (ref 136–145)
TOTAL PROTEIN: 7.2 g/dL (ref 6.4–8.3)

## 2014-11-15 LAB — CBC WITH DIFFERENTIAL/PLATELET
BASO%: 0.8 % (ref 0.0–2.0)
Basophils Absolute: 0.1 10*3/uL (ref 0.0–0.1)
EOS%: 4.5 % (ref 0.0–7.0)
Eosinophils Absolute: 0.7 10*3/uL — ABNORMAL HIGH (ref 0.0–0.5)
HCT: 33.7 % — ABNORMAL LOW (ref 38.4–49.9)
HGB: 11.5 g/dL — ABNORMAL LOW (ref 13.0–17.1)
LYMPH#: 2.8 10*3/uL (ref 0.9–3.3)
LYMPH%: 19.4 % (ref 14.0–49.0)
MCH: 31.4 pg (ref 27.2–33.4)
MCHC: 34.1 g/dL (ref 32.0–36.0)
MCV: 92.1 fL (ref 79.3–98.0)
MONO#: 1 10*3/uL — ABNORMAL HIGH (ref 0.1–0.9)
MONO%: 7.2 % (ref 0.0–14.0)
NEUT%: 68.1 % (ref 39.0–75.0)
NEUTROS ABS: 9.9 10*3/uL — AB (ref 1.5–6.5)
PLATELETS: 418 10*3/uL — AB (ref 140–400)
RBC: 3.66 10*6/uL — ABNORMAL LOW (ref 4.20–5.82)
RDW: 12.7 % (ref 11.0–14.6)
WBC: 14.5 10*3/uL — AB (ref 4.0–10.3)

## 2014-11-15 MED ORDER — PROCHLORPERAZINE MALEATE 10 MG PO TABS: 10.0000 mg | ORAL_TABLET | Freq: Four times a day (QID) | ORAL | Status: AC | PRN

## 2014-11-15 NOTE — Therapy (Signed)
[place], [place], [address] Phone: [phone]   Fax:  [phone]  Physical Therapy Evaluation  Patient Details  Name: [name] MRN: [id number] Date of Birth: 04-02-1961 Referring Provider:  Curt Bears, MD  Encounter Date: 11/15/2014      PT End of Session - 11/15/14 1633    Visit Number 1   Number of Visits 1   PT Start Time 1455   PT Stop Time 1517   PT Time Calculation (min) 22 min   Activity Tolerance Patient tolerated treatment well   Behavior During Therapy [facility] for tasks assessed/performed      Past Medical History  Diagnosis Date  . HLD (hyperlipidemia)   . HTN (hypertension)     "took me off my BP pills" (06/20/2014)  . GERD (gastroesophageal reflux disease)     hx (06/20/2014)  . History of gout   . Myocardial infarction 02/2005; 05/2011  . Pulmonary embolism 09/21/2014    on Lovenox  . Lung mass 08/2014  . Coronary artery disease     a. s/p Taxus DES to CFX 8/06 in setting of NSTEMI;  b.  s/p Inf STEMI 06/21/11:  mRCA occluded>> PCI:  Overlapping Promus DES x 2 to RCA.;  c.  LHC (11/15):  pLAD 20-30%, oDx branches 50-60%, mCFX stent 95% ISR, dCFX stent ok, mRCA stent occluded with L-R collats, EF 50-55%, basal inf AK, mid Inf HK >> PCI:  3.25 x 23 mm Xience DES to [facility]    Past Surgical History  Procedure Laterality Date  . Left heart cath N/A 06/21/2011    Procedure: LEFT HEART CATH;  Surgeon: [name], MD;  Location: [facility] CATH LAB;  Service: Cardiovascular;  Laterality: N/A;  . Left heart catheterization with coronary angiogram N/A 06/20/2014    Procedure: LEFT HEART CATHETERIZATION WITH CORONARY ANGIOGRAM;  Surgeon: Peter M Martinique, MD;  Location: [facility] CATH LAB;  Service: Cardiovascular;  Laterality: N/A;  . Coronary angioplasty with stent placement  02/2005; 05/2011; 06/20/2014    "1; ?2; 1"  . Esophagogastroduodenoscopy  2004  . Colonoscopy w/ polypectomy    .  Video bronchoscopy with endobronchial ultrasound N/A 11/05/2014    Procedure: VIDEO BRONCHOSCOPY WITH ENDOBRONCHIAL ULTRASOUND;  Surgeon: [name], MD;  Location: [place];  Service: Thoracic;  Laterality: N/A;  . Mediastinoscopy N/A 11/05/2014    Procedure:  MEDIASTINOSCOPY;  Surgeon: [name], MD;  Location: [place];  Service: Thoracic;  Laterality: N/A;    There were no vitals filed for this visit.  Visit Diagnosis:  Physical deconditioning - Plan: PT plan of care cert/re-cert      Subjective Assessment - 11/15/14 1624    Subjective Pt. had left shoulder pain, nausea and vomiting, now resolved.   Patient is accompained by: Family member   Pertinent History Patient presented to ED with left shoulder pain, nausea, and vomiting; work-up showed left hilar mass.  Pt. underwent mediastinoscopy 11/05/14 and was diagnosed with poorly differentiated carcinoma, probable stage IIIB (left upper lobe nodule, nodes).  Pt. is expected to have concurrent chemoradiation.  Current smoker; 2006 MI; 2012 scardiac stent placed; PE 09/21/14 and on Lovenox; HTN.   Currently in Pain? No/denies            [facility] PT Assessment - 11/15/14 0001    Assessment   Medical Diagnosis stage IIIB poorly differentiated carcinoma of left upper lobe.   Onset Date 09/21/14   Precautions   Precautions Other (  comment)  cancer precautions   Restrictions   Weight Bearing Restrictions No   Balance Screen   Has the patient fallen in the past 6 months No   Has the patient had a decrease in activity level because of a fear of falling?  No   Is the patient reluctant to leave their home because of a fear of falling?  No   Home Environment   Living Enviornment Private residence   Living Arrangements Spouse/significant other   Type of [name] One level   Prior Function   Level of Independence Independent with basic ADLs;Independent with homemaking with ambulation;Independent with gait    Vocation Requirements paints and pressure-washes houses   Leisure no regular exercise program   Observation/Other Assessments   Observations adult male in no distress   Functional Tests   Functional tests Sit to Stand   Sit to Stand   Comments 15 times in 30 seconds  became mildly SOB   Posture/Postural Control   Posture/Postural Control Postural limitations   Postural Limitations Increased lumbar lordosis   ROM / Strength   AROM / PROM / Strength AROM   AROM   Overall AROM  Within functional limits for tasks performed   Overall AROM Comments standing trunk AROM   Ambulation/Gait   Ambulation/Gait Yes   Ambulation/Gait Assistance 7: Independent   Balance   Balance Assessed Yes   Dynamic Standing Balance   Dynamic Standing - Comments reaches 11 inches forward in standing                           PT Education - 11/15/14 1633    Education provided Yes   Education Details posture, breathing, walking, energy conservation   Person(s) Educated Patient;Spouse   Methods Explanation;Handout   Comprehension Verbalized understanding               Lung Clinic Goals - 11/15/14 1637    Patient will be able to verbalize understanding of the benefit of exercise to decrease fatigue.   Status Achieved   Patient will be able to verbalize the importance of posture.   Status Achieved   Patient will be able to demonstrate diaphragmatic breathing for improved lung function.   Status Achieved   Patient will be able to verbalize understanding of the role of physical therapy to prevent functional decline and who to contact if physical therapy is needed.   Status Achieved             Plan - 11/15/14 1634    Clinical Impression Statement Patient s/p segmentectomy for lung cancer with mild debility from surgery; will have chemoradiation treatments for stage IIIB cancer.   PT Frequency One time visit   PT Treatment/Interventions Patient/family education   PT Next  Visit Plan none at this time; may benefit from therapy going forward in case of fatigue or debility from treatment   PT Home Exercise Plan see education section   Consulted and Agree with Plan of Care Patient         Problem List Patient Active Problem List   Diagnosis Date Noted  . Non-small cell carcinoma of lung, stage 3 11/15/2014  . Lung mass 09/23/2014  . Swelling of lower extremity 09/23/2014  . Demand ischemia 09/23/2014  . Pulmonary embolism 09/21/2014  . PE (pulmonary embolism) 09/21/2014  . Unstable angina pectoris 06/20/2014  . Bradycardia 07/06/2011  . Tobacco abuse 07/06/2011  . Coronary  artery disease 06/21/2011  . Hypertension 06/21/2011  . Hyperlipidemia 06/21/2011    SALISBURY,DONNA 11/15/2014, 4:39 PM  [place], [place], [address] Phone: [phone]   Fax:  [place], PT 11/15/2014 4:39 PM

## 2014-11-15 NOTE — Progress Notes (Signed)
[name] [phone]   [phone] Multidisciplinary thoracic oncology clinic CONSULT NOTE  REFERRING PHYSICIAN: Dr. Modesto Charon  REASON FOR CONSULTATION:  54 years old white male recently diagnosed with lung cancer.  HPI CHOZEN LATULIPPE is a 54 y.o. male with past medical history significant for coronary R disease status post myocardial infarction and stent placement in 2006 and 2015, hypertension, dyslipidemia, history of pulmonary embolism and deep venous thrombosis. The patient also has a long history of smoking. The patient mentions that in February 2016 he noted swelling of his leg. He was referred to have Doppler of the lower extremities for evaluation of deep venous thrombosis. As the patient was waiting for the Doppler to start having headache and shortness of breath as well as left shoulder pain. He is transferred to the emergency department for evaluation and CT scan of the chest was performed on 09/21/2014. It showed acute pulmonary embolus and upper lobe branches of the right pulmonary artery. There was also 2.3 x 1.6 cm spiculated density in the left suprahilar region concerning for malignancy. Immediately superior to the serosa 0.8 cm spiculated abnormality in the left lung apex concerning for malignancy as well. There was also a left hilar, AP window as well as right paratracheal adenopathy consistent with metastatic disease. The patient was started on treatment with Lovenox for his pulmonary embolism. MRI of the brain performed on 09/22/2014 showed normal appearance of the brain was no evidence of metastatic disease to the brain or meninges. A PET scan was performed on 10/02/2014 and it showed hypermetabolic bilateral mediastinal lymph nodes. Right paratracheal lymph node measures 8 mm and has an SUV max equal to 6.14. Hypermetabolic sub- carinal lymph node measures 1.1 cm and has an SUV max equal to 6.7. Hypermetabolic left hilar lymph node  measures 3.2 cm and has an SUV max equal to 5.9. Within the left upper lobe there is a central perihilar lesion which measures 2.3 x 1.6 x 2.5 cm and has an SUV max equal to 10.2. Within the left apex there is a small satellite nodule measuring 8 mm. This has an SUV max equal to 2.5. The patient was referred to Dr. Roxan Hockey and on 11/05/2014 he underwent a video bronchoscopy, endobronchial ultrasound with mediastinal lymph node respirations and mediastinoscopy.  The final pathology (Accession: [id number]) of the level 7 lymph node showed metastatic poorly differentiated carcinoma. The malignant cells are positive for cytokeratin 7. There is focal non specific staining for cytokeratin 20 and CD56. The tumor cells are negative for cytokeratin 5/6, chromogranin, HMB45, MIT-F, synaptophysin, S100, TTF-1, Napsin-A, and Melan-A. This immunohistochemical profile is non specific. The H&E features suggest poorly differentiated adenocarcinoma, and the leading differential diagnosis includes primary lung. Dr. Roxan Hockey kindly referred the patient to me today for evaluation and recommendation regarding treatment of his condition. When seen today he has no complaints except for shortness breath with exertion. He denied having any significant chest pain, cough or hemoptysis. He has no significant weight loss or night sweats. He has occasional headache and visual floaters.  Family history significant for mother with hypertension and father had liver cirrhosis. The patient is married and has 3 children. He was accompanied by his wife Clarene Critchley. She works in [employer]. He has a history of smoking 1.5 pack per day for around 40 years and quit 1 week ago. He has no history of alcohol or drug abuse.  HPI  Past Medical History  Diagnosis Date  . HLD (  hyperlipidemia)   . HTN (hypertension)     "took me off my BP pills" (06/20/2014)  . GERD (gastroesophageal reflux disease)     hx (06/20/2014)  . History of gout   .  Myocardial infarction 02/2005; 05/2011  . Pulmonary embolism 09/21/2014    on Lovenox  . Lung mass 08/2014  . Coronary artery disease     a. s/p Taxus DES to CFX 8/06 in setting of NSTEMI;  b.  s/p Inf STEMI 06/21/11:  mRCA occluded>> PCI:  Overlapping Promus DES x 2 to RCA.;  c.  LHC (11/15):  pLAD 20-30%, oDx branches 50-60%, mCFX stent 95% ISR, dCFX stent ok, mRCA stent occluded with L-R collats, EF 50-55%, basal inf AK, mid Inf HK >> PCI:  3.25 x 23 mm Xience DES to [facility]    Past Surgical History  Procedure Laterality Date  . Left heart cath N/A 06/21/2011    Procedure: LEFT HEART CATH;  Surgeon: [name], MD;  Location: [facility] CATH LAB;  Service: Cardiovascular;  Laterality: N/A;  . Left heart catheterization with coronary angiogram N/A 06/20/2014    Procedure: LEFT HEART CATHETERIZATION WITH CORONARY ANGIOGRAM;  Surgeon: Peter M Martinique, MD;  [facility] CATH LAB;  Service: Cardiovascular;  Laterality: N/A;  . Coronary angioplasty with stent placement  02/2005; 05/2011; 06/20/2014    "1; ?2; 1"  . Esophagogastroduodenoscopy  2004  . Colonoscopy w/ polypectomy    . Video bronchoscopy with endobronchial ultrasound N/A 11/05/2014    Procedure: VIDEO BRONCHOSCOPY WITH ENDOBRONCHIAL ULTRASOUND;  Surgeon: [name], MD;  Location: [place];  Service: Thoracic;  Laterality: N/A;  . Mediastinoscopy N/A 11/05/2014    Procedure:  MEDIASTINOSCOPY;  Surgeon: [name], MD;  Location: [facility] OR;  Service: Thoracic;  Laterality: N/A;    Family History  Problem Relation Age of Onset  . Hypertension Father     Social History History  Substance Use Topics  . Smoking status: Current Some Day Smoker -- 1.00 packs/day for 39 years    Types: Cigarettes  . Smokeless tobacco: Never Used     Comment: pt has not smoked in 2 days.  . Alcohol Use: No    No Known Allergies  Current Outpatient Prescriptions  Medication Sig Dispense Refill  . aspirin 81 MG tablet Take 81 mg by mouth  daily.    [name] atorvastatin (LIPITOR) 80 MG tablet TAKE ONE TABLET BY MOUTH ONCE DAILY 30 tablet 5  . clopidogrel (PLAVIX) 75 MG tablet TAKE ONE TABLET BY MOUTH ONCE DAILY 30 tablet 5  . enoxaparin (LOVENOX) 100 MG/ML injection Inject 1 mL (100 mg total) into the skin daily. 30 Syringe 2  . metoprolol tartrate (LOPRESSOR) 25 MG tablet TAKE ONE-HALF TABLET BY MOUTH TWICE DAILY 30 tablet 5  . nitroGLYCERIN (NITROSTAT) 0.4 MG SL tablet Place 1 tablet (0.4 mg total) under the tongue every 5 (five) minutes as needed for chest pain. 25 tablet 11  . oxyCODONE (OXY IR/ROXICODONE) 5 MG immediate release tablet Take 1-2 tablets (5-10 mg total) by mouth 4 (four) times daily as needed (pain). 30 tablet 0  . sildenafil (VIAGRA) 50 MG tablet Take 50 mg by mouth daily as needed for erectile dysfunction.    . prochlorperazine (COMPAZINE) 10 MG tablet Take 1 tablet (10 mg total) by mouth every 6 (six) hours as needed for nausea or vomiting. 30 tablet 0   No current facility-administered medications for this visit.    Review of Systems  Constitutional: positive for  fatigue Eyes: negative Ears, nose, mouth, throat, and face: negative Respiratory: positive for dyspnea on exertion Cardiovascular: negative Gastrointestinal: negative Genitourinary:negative Integument/breast: negative Hematologic/lymphatic: negative Musculoskeletal:negative Neurological: negative Behavioral/Psych: negative Endocrine: negative Allergic/Immunologic: negative  Physical Exam  NGE:XBMWU, healthy, no distress, well nourished, well developed and anxious SKIN: skin color, texture, turgor are normal, no rashes or significant lesions HEAD: Normocephalic, No masses, lesions, tenderness or abnormalities EYES: normal, PERRLA, Conjunctiva are pink and non-injected EARS: External ears normal, Canals clear OROPHARYNX:no exudate, no erythema and lips, buccal mucosa, and tongue normal  NECK: supple, no adenopathy, no JVD LYMPH:  no  palpable lymphadenopathy, no hepatosplenomegaly LUNGS: clear to auscultation , and palpation HEART: regular rate & rhythm, no murmurs and no gallops ABDOMEN:abdomen soft, non-tender, normal bowel sounds and no masses or organomegaly BACK: Back symmetric, no curvature., No CVA tenderness EXTREMITIES:no joint deformities, effusion, or inflammation, no edema, no skin discoloration  NEURO: alert & oriented x 3 with fluent speech, no focal motor/sensory deficits  PERFORMANCE STATUS: ECOG 1  LABORATORY DATA: Lab Results  Component Value Date   WBC 14.5* 11/15/2014   HGB 11.5* 11/15/2014   HCT 33.7* 11/15/2014   MCV 92.1 11/15/2014   PLT 418* 11/15/2014      Chemistry      Component Value Date/Time   NA 144 11/15/2014 1340   NA 136 10/26/2014 1003   K 3.6 11/15/2014 1340   K 4.5 10/26/2014 1003   CL 106 10/26/2014 1003   CO2 20* 11/15/2014 1340   CO2 21 10/26/2014 1003   BUN 12.8 11/15/2014 1340   BUN 17 10/26/2014 1003   CREATININE 0.8 11/15/2014 1340   CREATININE 0.92 10/26/2014 1003      Component Value Date/Time   CALCIUM 9.5 11/15/2014 1340   CALCIUM 10.0 10/26/2014 1003   ALKPHOS 108 11/15/2014 1340   ALKPHOS 114 10/26/2014 1003   AST 13 11/15/2014 1340   AST 22 10/26/2014 1003   ALT 17 11/15/2014 1340   ALT 33 10/26/2014 1003   BILITOT 0.30 11/15/2014 1340   BILITOT 0.6 10/26/2014 1003       RADIOGRAPHIC STUDIES: Dg Chest 2 View  11/12/2014   CLINICAL DATA:  Dyspnea x 3-4 days after bronchoscopy 11/05/14, mediastinoscopy 11/05/14, diagnosed with lung cancer 09/21/14, patient states when he falls asleep he gets sob and it wakes him up , there is also sob on exertion, ? Pneumothorax, patient has a large knot at the sternal notch, CAD, HTN, hx of embolism  EXAM: CHEST - 2 VIEW  COMPARISON:  11/05/2014 and earlier studies  FINDINGS: Spiculated left suprahilar lesion. Lobular right paratracheal fullness has progressed specially compared to previous radiograph 09/21/2014.  Right lung remains clear. Heart size normal. Atheromatous aorta. Coronary calcifications. No pneumothorax. No effusion. Visualized skeletal structures are unremarkable.  IMPRESSION: 1. Persistent left suprahilar lesion with progressive right paratracheal adenopathy.   Electronically Signed   By: Lucrezia Europe M.D.   On: 11/12/2014 16:00   Dg Chest 2 View  11/05/2014   CLINICAL DATA:  Preop, mass in the left upper lobe with adenopathy  EXAM: CHEST  2 VIEW  COMPARISON:  09/21/2014  FINDINGS: Stable appearance of the left suprahilar mass with left hilar and right peritracheal enlargement. Normal heart size and aortic contours. Bilateral coronary stents are noted. There is no edema, consolidation, effusion, or pneumothorax.  IMPRESSION: 1. No active cardiopulmonary disease. 2. Grossly stable left hilar mass.   Electronically Signed   By: Monte Fantasia M.D.   On:  11/05/2014 06:06    ASSESSMENT: This is a very pleasant 54 years old white male recently diagnosed with a stage IIIB (T3, N3, M0) non-small cell lung cancer, poorly differentiated adenocarcinoma diagnosed in April 2016, and presenting with left suprahilar mass in addition to bilateral mediastinal lymphadenopathy.   PLAN: I had a lengthy discussion with the patient and his wife today about his current disease stage, prognosis and treatment options. I recommended for the patient treatment with a course of concurrent chemoradiation with weekly carboplatin for AUC of 2 and paclitaxel 45 MG/M2 with radiation for a total of 6-7 weeks. I discussed with the patient adverse effect of the chemotherapy including but not limited to alopecia, myelosuppression, nausea and vomiting, peripheral neuropathy, liver or renal dysfunction. After completion of the course of concurrent chemoradiation, the patient may benefit from 3 cycles of consolidation chemotherapy that this will be discussed with him in more details at that time. I will arrange for the patient to have a  chemotherapy education class before starting the first dose of his treatment on 12/18/2014. The patient was seen earlier today by Dr. Pablo Ledger for evaluation and discussion of the radiotherapy option. I will call his pharmacy with prescription for Compazine 10 mg by mouth every 6 hours as needed for nausea. The patient was seen during the multidisciplinary thoracic oncology clinic today by medical oncology, radiation oncology, thoracic navigator, social worker as well as physical therapist. He would come back for follow-up visit in 3 weeks for reevaluation and management any adverse effect of his treatment. For smoke cessation, I strongly encouraged the patient to continue quitting smoking and to call if he has any concern for relapse. The patient and his wife agreed to the current plan. The patient was advised to call immediately if he has any concerning symptoms in the interval.  The patient voices understanding of current disease status and treatment options and is in agreement with the current care plan.  All questions were answered. The patient knows to call the clinic with any problems, questions or concerns. We can certainly see the patient much sooner if necessary.  Thank you so much for allowing me to participate in the care of [place]. I will continue to follow up the patient with you and assist in his care.  I spent 50 minutes counseling the patient face to face. The total time spent in the appointment was 80 minutes.  Disclaimer: This note was dictated with voice recognition software. Similar sounding words can inadvertently be transcribed and may not be corrected upon review.   Siona Coulston K. November 15, 2014, 3:01 PM

## 2014-11-15 NOTE — [facility] Oncology Navigator Note (Unsigned)
Updated [facility] pathology dept today regarding [facility] request.  Tammy notified me tissue has been sent.

## 2014-11-15 NOTE — Progress Notes (Signed)
[place] [facility] Clinical Social Work  Clinical Social Work met with patient/family at [employer] appointment to offer support and assess for psychosocial needs.  CSW, navigator, and patient/family discussed patient's upcoming appointments and plan of care.  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    Clinical Social Work briefly discussed Clinical Social Work role and [employer] support programs/services.  Clinical Social Work encouraged patient to call with any additional questions or concerns.   Polo Riley, MSW, LCSW, OSW-C Clinical Social Worker [facility] [phone]

## 2014-11-16 ENCOUNTER — Encounter: Payer: Self-pay | Admitting: *Deleted

## 2014-11-16 ENCOUNTER — Telehealth: Payer: Self-pay | Admitting: Internal Medicine

## 2014-11-16 ENCOUNTER — Other Ambulatory Visit: Payer: Self-pay | Admitting: *Deleted

## 2014-11-16 ENCOUNTER — Telehealth: Payer: Self-pay | Admitting: *Deleted

## 2014-11-16 DIAGNOSIS — C3492 Malignant neoplasm of unspecified part of left bronchus or lung: Secondary | ICD-10-CM

## 2014-11-16 NOTE — Telephone Encounter (Signed)
returned call and confirmed appt....pt ok adn aware

## 2014-11-16 NOTE — Telephone Encounter (Signed)
Pt confirmed labs/ov per 04/21 POF, gave pt AVS and Calendar.... KJ, sent msg to add chemo

## 2014-11-16 NOTE — Telephone Encounter (Signed)
Called Rad Onc, SIM to clarify appt time and date.  I was given clarification.  I contacted schedulers to change appt for chemo ed and labs to accommodate SIM.  I called patient to update.

## 2014-11-16 NOTE — [facility] Oncology Navigator Note (Unsigned)
   Thoracic Treatment Summary Name:[name] Date:11/16/2014 DOB:08-01-1960 Your Medical Team Medical Oncologist:Dr. Julien Nordmann Radiation Oncologist:Dr. Pablo Ledger Pulmonologist: Dr. Melvyn Novas Surgeon:Dr. Roxan Hockey Type and Stage of Lung Cancer Non-Small Cell Carcinoma: poorly differentiated carcinoma   Clinical Stage:  Non-small cell carcinoma of lung, stage 3   Staging form: Lung, AJCC 7th Edition     Clinical stage from 11/29/2014: Stage IIIB (T3, N3, M0) - Signed by Curt Bears, MD on 11/15/2014       Staging comments: Adenocarcinoma     Clinical stage is based on radiology exams.  Pathological stage will be determined after surgery.  Staging is based on the size of the tumor, involvement of lymph nodes or not, and whether or not the cancer center has spread. Recommendations Recommendations: Concurrent chemo radiation therapy  These recommendations are based on information available as of today's consult.  This is subject to change depending further testing or exams. Next Steps Next Step: Medical Oncology will set up follow up appointments  Radiation Oncology will set up follow up appointments 11/20/14 SIM/planning session Barriers to Care What do you perceive as a potential barrier that may prevent you from receiving your treatment plan? Education information on lung cancer given and explained Support information on support services here at cancer center and with ACS given and explained  Financial FA will follow up with patient to assess needs Nutrition-referred to dietitian  Resources Given: [place] on [place] at [employer].Radonna Ricker [phone] Fall Risk Information ACS healthy eating   Questions Norton Blizzard, RN BSN Thoracic Oncology Nurse Navigator at [facility] is a nurse navigator that is available to assist you through your cancer journey.  She can answer your questions and/or provide  resources regarding your treatment plan, emotional support, or financial concerns.

## 2014-11-16 NOTE — Telephone Encounter (Signed)
s.w. pt and advised on 4.26 lab and chemo edu time change due to radiation time at 9 per pof....pt aware of new time on 4.26

## 2014-11-19 ENCOUNTER — Other Ambulatory Visit: Payer: Self-pay | Admitting: Medical Oncology

## 2014-11-19 DIAGNOSIS — C3490 Malignant neoplasm of unspecified part of unspecified bronchus or lung: Secondary | ICD-10-CM

## 2014-11-20 ENCOUNTER — Other Ambulatory Visit: Payer: 59

## 2014-11-20 ENCOUNTER — Other Ambulatory Visit (HOSPITAL_BASED_OUTPATIENT_CLINIC_OR_DEPARTMENT_OTHER): Payer: 59

## 2014-11-20 ENCOUNTER — Ambulatory Visit
Admission: RE | Admit: 2014-11-20 | Discharge: 2014-11-20 | Disposition: A | Discharge status: Home / Self Care | Payer: 59 | Source: Ambulatory Visit | Attending: Radiation Oncology | Admitting: Radiation Oncology

## 2014-11-20 ENCOUNTER — Telehealth: Payer: Self-pay | Admitting: Internal Medicine

## 2014-11-20 ENCOUNTER — Encounter: Payer: Self-pay | Admitting: *Deleted

## 2014-11-20 ENCOUNTER — Ambulatory Visit (INDEPENDENT_AMBULATORY_CARE_PROVIDER_SITE_OTHER): Payer: 59 | Admitting: Thoracic Surgery (Cardiothoracic Vascular Surgery)

## 2014-11-20 ENCOUNTER — Encounter: Payer: Self-pay | Admitting: Thoracic Surgery (Cardiothoracic Vascular Surgery)

## 2014-11-20 VITALS — BP 140/90 | HR 90 | Resp 20

## 2014-11-20 DIAGNOSIS — C3492 Malignant neoplasm of unspecified part of left bronchus or lung: Secondary | ICD-10-CM | POA: Diagnosis not present

## 2014-11-20 DIAGNOSIS — C3412 Malignant neoplasm of upper lobe, left bronchus or lung: Secondary | ICD-10-CM | POA: Diagnosis not present

## 2014-11-20 DIAGNOSIS — Z510 Encounter for antineoplastic radiation therapy: Secondary | ICD-10-CM | POA: Insufficient documentation

## 2014-11-20 DIAGNOSIS — D381 Neoplasm of uncertain behavior of trachea, bronchus and lung: Secondary | ICD-10-CM

## 2014-11-20 DIAGNOSIS — C3490 Malignant neoplasm of unspecified part of unspecified bronchus or lung: Secondary | ICD-10-CM

## 2014-11-20 DIAGNOSIS — IMO0002 Reserved for concepts with insufficient information to code with codable children: Secondary | ICD-10-CM

## 2014-11-20 DIAGNOSIS — T148 Other injury of unspecified body region: Secondary | ICD-10-CM

## 2014-11-20 LAB — COMPREHENSIVE METABOLIC PANEL (CC13)
ALT: 41 U/L (ref 0–55)
AST: 21 U/L (ref 5–34)
Albumin: 3.7 g/dL (ref 3.5–5.0)
Alkaline Phosphatase: 115 U/L (ref 40–150)
Anion Gap: 13 mEq/L — ABNORMAL HIGH (ref 3–11)
BUN: 14 mg/dL (ref 7.0–26.0)
CALCIUM: 9.6 mg/dL (ref 8.4–10.4)
CO2: 20 meq/L — AB (ref 22–29)
Chloride: 108 mEq/L (ref 98–109)
Creatinine: 0.8 mg/dL (ref 0.7–1.3)
EGFR: >90 mL/min/{1.73_m2} (ref >90)
Glucose: 127 mg/dl (ref 70–140)
Potassium: 3.8 mEq/L (ref 3.5–5.1)
SODIUM: 140 meq/L (ref 136–145)
TOTAL PROTEIN: 7.5 g/dL (ref 6.4–8.3)
Total Bilirubin: 0.24 mg/dL (ref 0.20–1.20)

## 2014-11-20 LAB — CBC WITH DIFFERENTIAL/PLATELET
BASO%: 0.5 % (ref 0.0–2.0)
BASOS ABS: 0.1 10*3/uL (ref 0.0–0.1)
EOS ABS: 0.9 10*3/uL — AB (ref 0.0–0.5)
EOS%: 4.5 % (ref 0.0–7.0)
HCT: 35.2 % — ABNORMAL LOW (ref 38.4–49.9)
HGB: 11.7 g/dL — ABNORMAL LOW (ref 13.0–17.1)
LYMPH%: 16.1 % (ref 14.0–49.0)
MCH: 31.1 pg (ref 27.2–33.4)
MCHC: 33.2 g/dL (ref 32.0–36.0)
MCV: 93.6 fL (ref 79.3–98.0)
MONO#: 1.2 10*3/uL — AB (ref 0.1–0.9)
MONO%: 5.6 % (ref 0.0–14.0)
NEUT#: 15.1 10*3/uL — ABNORMAL HIGH (ref 1.5–6.5)
NEUT%: 73.3 % (ref 39.0–75.0)
PLATELETS: 429 10*3/uL — AB (ref 140–400)
RBC: 3.76 10*6/uL — AB (ref 4.20–5.82)
RDW: 12.9 % (ref 11.0–14.6)
WBC: 20.5 10*3/uL — ABNORMAL HIGH (ref 4.0–10.3)
lymph#: 3.3 10*3/uL (ref 0.9–3.3)

## 2014-11-20 LAB — TECHNOLOGIST REVIEW

## 2014-11-20 NOTE — Telephone Encounter (Signed)
Wife stopped by to have 85/2 lab adjusted. Also added weekly labs to 6/6 and 6/13 tx. Wife given new schedule for May/June.

## 2014-11-20 NOTE — Progress Notes (Signed)
[address]       [place] [address]             [phone]       HPI:  Mr. Bologna returns today for a wound check.  He is a 54 year old man who had a mediastinoscopy 11/06/14. He was diagnosed with stage IIIB non-small cell carcinoma. He is scheduled to start chemotherapy and radiation therapy next week.  He was seen in the office on 4/18. He had a wound seroma. We opted to follow that and see if it would improve spontaneously. However, it has gotten a little larger. He is on Lovenox for pulmonary embolus.  Past Medical History  Diagnosis Date  . HLD (hyperlipidemia)   . HTN (hypertension)     "took me off my BP pills" (06/20/2014)  . GERD (gastroesophageal reflux disease)     hx (06/20/2014)  . History of gout   . Myocardial infarction 02/2005; 05/2011  . Pulmonary embolism 09/21/2014    on Lovenox  . Lung mass 08/2014  . Coronary artery disease     a. s/p Taxus DES to CFX 8/06 in setting of NSTEMI;  b.  s/p Inf STEMI 06/21/11:  mRCA occluded>> PCI:  Overlapping Promus DES x 2 to RCA.;  c.  LHC (11/15):  pLAD 20-30%, oDx branches 50-60%, mCFX stent 95% ISR, dCFX stent ok, mRCA stent occluded with L-R collats, EF 50-55%, basal inf AK, mid Inf HK >> PCI:  3.25 x 23 mm Xience DES to mCFX      Current Outpatient Prescriptions  Medication Sig Dispense Refill  . aspirin 81 MG tablet Take 81 mg by mouth daily.    [name] atorvastatin (LIPITOR) 80 MG tablet TAKE ONE TABLET BY MOUTH ONCE DAILY 30 tablet 5  . clopidogrel (PLAVIX) 75 MG tablet TAKE ONE TABLET BY MOUTH ONCE DAILY 30 tablet 5  . diphenhydramine-acetaminophen (TYLENOL PM) 25-500 MG TABS Take 1 tablet by mouth at bedtime.    . enoxaparin (LOVENOX) 100 MG/ML injection Inject 1 mL (100 mg total) into the skin daily. 30 Syringe 2  . metoprolol tartrate (LOPRESSOR) 25 MG tablet TAKE ONE-HALF TABLET BY MOUTH TWICE DAILY 30 tablet 5  . nitroGLYCERIN (NITROSTAT) 0.4 MG SL tablet Place 1 tablet (0.4 mg total) under  the tongue every 5 (five) minutes as needed for chest pain. 25 tablet 11  . prochlorperazine (COMPAZINE) 10 MG tablet Take 1 tablet (10 mg total) by mouth every 6 (six) hours as needed for nausea or vomiting. 30 tablet 0  . sildenafil (VIAGRA) 50 MG tablet Take 50 mg by mouth daily as needed for erectile dysfunction.     No current facility-administered medications for this visit.    Physical Exam BP 140/90 mmHg  Pulse 90  Resp 20  SpO35 13% 54 year old man in no acute distress Neck wound with a seroma, minimal erythema  Diagnostic Tests: none  Impression: 54 year old man with a seroma after mediastinal endoscopy. I think at this point we should go ahead and aspirate that. I discussed the indications, risks, benefits, and alternatives with him. He accepts the risks and agrees to aspiration. He does know that this may recur  Procedure:  Using sterile technique and 1% lidocaine (1 mL) for local anesthesia neck seroma aspirated. 11 mL of serosanguineous fluid evacuated. The fluid was not purulent. The patient tolerated the procedure well.    Plan: Mr. Manthei is going to begin chemotherapy and radiation.   He will  let us know if he has any further problems with his incision and can come by the office one day after radiation if he needs [place] to look at it.  [name], MD [facility] [phone]

## 2014-11-20 NOTE — Progress Notes (Signed)
[facility] Radiation Oncology Simulation and Treatment Planning Note   Name: [name] MRN: [id number]  Date: 11/20/2014  DOB: 07-18-1961  Status: outpatient    DIAGNOSIS: Non-small cell carcinoma of lung, stage 3   Staging form: Lung, AJCC 7th Edition     Clinical stage from 11/30/2014: Stage IIIB (T3, N3, M0) - Signed by Curt Bears, MD on 11/15/2014       Staging comments: Adenocarcinoma    CONSENT VERIFIED: yes   SET UP AND IMMOBILIZATION: Patient is setup supine with arms in a wing board.   NARRATIVE: The patient was brought to the [facility].  Identity was confirmed.  All relevant records and images related to the planned course of therapy were reviewed.  Then, the patient was positioned in a stable reproducible clinical set-up for radiation therapy.  CT images were obtained.  Skin markings were placed.  The CT images were loaded into the planning software where the target and avoidance structures were contoured.  The radiation prescription was entered and confirmed.   TREATMENT PLANNING NOTE:  Treatment planning then occurred. I have requested 3D simulation with [facility] of the spinal cord, total lungs and gross tumor volume. I have also requested mlcs and an isodose plan.   Special treatment procedure will be performed as [place] will be receiving concurrent chemotherapy.   I have ordered a consult with the dietician for monitoring.  I will also be verifying that weekly lab values are appropriate.

## 2014-11-21 ENCOUNTER — Telehealth: Payer: Self-pay | Admitting: Medical Oncology

## 2014-11-21 ENCOUNTER — Telehealth: Payer: Self-pay | Admitting: *Deleted

## 2014-11-21 NOTE — Telephone Encounter (Signed)
Noted,

## 2014-11-21 NOTE — Telephone Encounter (Signed)
Called to follow up from thoracic clinic.  I noted patient appt's are set up.  I left vm message to call if needed with my name and phone number

## 2014-11-23 ENCOUNTER — Ambulatory Visit
Admission: RE | Admit: 2014-11-23 | Discharge: 2014-11-23 | Disposition: A | Discharge status: Home / Self Care | Payer: 59 | Source: Ambulatory Visit | Attending: Family Medicine | Admitting: Family Medicine

## 2014-11-23 ENCOUNTER — Other Ambulatory Visit: Payer: Self-pay | Admitting: Family Medicine

## 2014-11-23 DIAGNOSIS — D72829 Elevated white blood cell count, unspecified: Secondary | ICD-10-CM

## 2014-11-23 DIAGNOSIS — R0600 Dyspnea, unspecified: Secondary | ICD-10-CM

## 2014-11-26 ENCOUNTER — Other Ambulatory Visit: Payer: Self-pay | Admitting: Nurse Practitioner

## 2014-11-26 ENCOUNTER — Other Ambulatory Visit (HOSPITAL_BASED_OUTPATIENT_CLINIC_OR_DEPARTMENT_OTHER): Payer: 59

## 2014-11-26 ENCOUNTER — Emergency Department (HOSPITAL_COMMUNITY): Payer: 59

## 2014-11-26 ENCOUNTER — Ambulatory Visit: Payer: 59

## 2014-11-26 ENCOUNTER — Inpatient Hospital Stay (HOSPITAL_COMMUNITY)
Admission: EM | Admit: 2014-11-26 | Discharge: 2014-12-26 | DRG: 040 | Disposition: E | Payer: 59 | Attending: Internal Medicine | Admitting: Internal Medicine

## 2014-11-26 ENCOUNTER — Other Ambulatory Visit: Payer: Self-pay

## 2014-11-26 ENCOUNTER — Ambulatory Visit (HOSPITAL_BASED_OUTPATIENT_CLINIC_OR_DEPARTMENT_OTHER): Payer: 59 | Admitting: Nurse Practitioner

## 2014-11-26 VITALS — BP 148/89 | HR 92 | Temp 98.0°F

## 2014-11-26 DIAGNOSIS — N179 Acute kidney failure, unspecified: Secondary | ICD-10-CM | POA: Diagnosis not present

## 2014-11-26 DIAGNOSIS — Z515 Encounter for palliative care: Secondary | ICD-10-CM

## 2014-11-26 DIAGNOSIS — I10 Essential (primary) hypertension: Secondary | ICD-10-CM | POA: Diagnosis present

## 2014-11-26 DIAGNOSIS — Z86711 Personal history of pulmonary embolism: Secondary | ICD-10-CM | POA: Diagnosis not present

## 2014-11-26 DIAGNOSIS — Z7901 Long term (current) use of anticoagulants: Secondary | ICD-10-CM

## 2014-11-26 DIAGNOSIS — E785 Hyperlipidemia, unspecified: Secondary | ICD-10-CM | POA: Diagnosis present

## 2014-11-26 DIAGNOSIS — I2699 Other pulmonary embolism without acute cor pulmonale: Secondary | ICD-10-CM

## 2014-11-26 DIAGNOSIS — I6340 Cerebral infarction due to embolism of unspecified cerebral artery: Secondary | ICD-10-CM | POA: Insufficient documentation

## 2014-11-26 DIAGNOSIS — I252 Old myocardial infarction: Secondary | ICD-10-CM

## 2014-11-26 DIAGNOSIS — I609 Nontraumatic subarachnoid hemorrhage, unspecified: Secondary | ICD-10-CM | POA: Diagnosis not present

## 2014-11-26 DIAGNOSIS — C7931 Secondary malignant neoplasm of brain: Secondary | ICD-10-CM | POA: Diagnosis not present

## 2014-11-26 DIAGNOSIS — R34 Anuria and oliguria: Secondary | ICD-10-CM | POA: Diagnosis not present

## 2014-11-26 DIAGNOSIS — C3490 Malignant neoplasm of unspecified part of unspecified bronchus or lung: Secondary | ICD-10-CM | POA: Diagnosis present

## 2014-11-26 DIAGNOSIS — Z66 Do not resuscitate: Secondary | ICD-10-CM | POA: Diagnosis present

## 2014-11-26 DIAGNOSIS — Z7982 Long term (current) use of aspirin: Secondary | ICD-10-CM

## 2014-11-26 DIAGNOSIS — C3492 Malignant neoplasm of unspecified part of left bronchus or lung: Secondary | ICD-10-CM | POA: Diagnosis not present

## 2014-11-26 DIAGNOSIS — T85598A Other mechanical complication of other gastrointestinal prosthetic devices, implants and grafts, initial encounter: Secondary | ICD-10-CM

## 2014-11-26 DIAGNOSIS — M25512 Pain in left shoulder: Secondary | ICD-10-CM | POA: Diagnosis not present

## 2014-11-26 DIAGNOSIS — D630 Anemia in neoplastic disease: Secondary | ICD-10-CM | POA: Diagnosis not present

## 2014-11-26 DIAGNOSIS — Z955 Presence of coronary angioplasty implant and graft: Secondary | ICD-10-CM

## 2014-11-26 DIAGNOSIS — F919 Conduct disorder, unspecified: Secondary | ICD-10-CM | POA: Diagnosis not present

## 2014-11-26 DIAGNOSIS — D649 Anemia, unspecified: Secondary | ICD-10-CM | POA: Diagnosis present

## 2014-11-26 DIAGNOSIS — Z7902 Long term (current) use of antithrombotics/antiplatelets: Secondary | ICD-10-CM | POA: Diagnosis not present

## 2014-11-26 DIAGNOSIS — F17210 Nicotine dependence, cigarettes, uncomplicated: Secondary | ICD-10-CM | POA: Diagnosis present

## 2014-11-26 DIAGNOSIS — R52 Pain, unspecified: Secondary | ICD-10-CM

## 2014-11-26 DIAGNOSIS — I214 Non-ST elevation (NSTEMI) myocardial infarction: Secondary | ICD-10-CM | POA: Diagnosis not present

## 2014-11-26 DIAGNOSIS — Z9289 Personal history of other medical treatment: Secondary | ICD-10-CM

## 2014-11-26 DIAGNOSIS — C3412 Malignant neoplasm of upper lobe, left bronchus or lung: Secondary | ICD-10-CM

## 2014-11-26 DIAGNOSIS — I639 Cerebral infarction, unspecified: Secondary | ICD-10-CM | POA: Diagnosis not present

## 2014-11-26 DIAGNOSIS — R1110 Vomiting, unspecified: Secondary | ICD-10-CM

## 2014-11-26 DIAGNOSIS — I749 Embolism and thrombosis of unspecified artery: Secondary | ICD-10-CM | POA: Diagnosis not present

## 2014-11-26 DIAGNOSIS — E531 Pyridoxine deficiency: Secondary | ICD-10-CM | POA: Diagnosis not present

## 2014-11-26 DIAGNOSIS — T45515A Adverse effect of anticoagulants, initial encounter: Secondary | ICD-10-CM | POA: Diagnosis present

## 2014-11-26 DIAGNOSIS — I509 Heart failure, unspecified: Secondary | ICD-10-CM | POA: Diagnosis not present

## 2014-11-26 DIAGNOSIS — R4182 Altered mental status, unspecified: Secondary | ICD-10-CM | POA: Diagnosis not present

## 2014-11-26 DIAGNOSIS — J9690 Respiratory failure, unspecified, unspecified whether with hypoxia or hypercapnia: Secondary | ICD-10-CM | POA: Insufficient documentation

## 2014-11-26 DIAGNOSIS — J9601 Acute respiratory failure with hypoxia: Secondary | ICD-10-CM | POA: Diagnosis not present

## 2014-11-26 DIAGNOSIS — R29818 Other symptoms and signs involving the nervous system: Secondary | ICD-10-CM

## 2014-11-26 DIAGNOSIS — R471 Dysarthria and anarthria: Secondary | ICD-10-CM | POA: Diagnosis present

## 2014-11-26 DIAGNOSIS — Z510 Encounter for antineoplastic radiation therapy: Secondary | ICD-10-CM | POA: Diagnosis present

## 2014-11-26 DIAGNOSIS — I2510 Atherosclerotic heart disease of native coronary artery without angina pectoris: Secondary | ICD-10-CM | POA: Diagnosis present

## 2014-11-26 DIAGNOSIS — R739 Hyperglycemia, unspecified: Secondary | ICD-10-CM | POA: Diagnosis not present

## 2014-11-26 DIAGNOSIS — R1310 Dysphagia, unspecified: Secondary | ICD-10-CM | POA: Diagnosis present

## 2014-11-26 DIAGNOSIS — Z01818 Encounter for other preprocedural examination: Secondary | ICD-10-CM

## 2014-11-26 DIAGNOSIS — G8194 Hemiplegia, unspecified affecting left nondominant side: Secondary | ICD-10-CM | POA: Diagnosis not present

## 2014-11-26 DIAGNOSIS — R4689 Other symptoms and signs involving appearance and behavior: Secondary | ICD-10-CM | POA: Insufficient documentation

## 2014-11-26 DIAGNOSIS — J9600 Acute respiratory failure, unspecified whether with hypoxia or hypercapnia: Secondary | ICD-10-CM | POA: Diagnosis not present

## 2014-11-26 LAB — COMPREHENSIVE METABOLIC PANEL
ALK PHOS: 111 U/L (ref 38–126)
ALT: 44 U/L (ref 17–63)
AST: 31 U/L (ref 15–41)
Albumin: 4.3 g/dL (ref 3.5–5.0)
Anion gap: 10 (ref 5–15)
BUN: 24 mg/dL — ABNORMAL HIGH (ref 6–20)
CO2: 24 mmol/L (ref 22–32)
Calcium: 9.8 mg/dL (ref 8.9–10.3)
Chloride: 106 mmol/L (ref 101–111)
Creatinine, Ser: 0.85 mg/dL (ref 0.61–1.24)
GFR calc Af Amer: >60 mL/min (ref >60)
Glucose, Bld: 103 mg/dL — ABNORMAL HIGH (ref 70–99)
POTASSIUM: 3.9 mmol/L (ref 3.5–5.1)
Sodium: 140 mmol/L (ref 135–145)
TOTAL PROTEIN: 7.8 g/dL (ref 6.5–8.1)
Total Bilirubin: 0.2 mg/dL — ABNORMAL LOW (ref 0.3–1.2)

## 2014-11-26 LAB — URINALYSIS, ROUTINE W REFLEX MICROSCOPIC
Bilirubin Urine: NEGATIVE
GLUCOSE, UA: NEGATIVE mg/dL
Hgb urine dipstick: NEGATIVE
Ketones, ur: NEGATIVE mg/dL
Leukocytes, UA: NEGATIVE
NITRITE: NEGATIVE
Protein, ur: NEGATIVE mg/dL
SPECIFIC GRAVITY, URINE: 1.024 (ref 1.005–1.030)
UROBILINOGEN UA: 0.2 mg/dL (ref 0.0–1.0)
pH: 6 (ref 5.0–8.0)

## 2014-11-26 LAB — DIFFERENTIAL
BASOS ABS: 0.2 10*3/uL — AB (ref 0.0–0.1)
Basophils Relative: 1 % (ref 0–1)
EOS PCT: 3 % (ref 0–5)
Eosinophils Absolute: 0.6 10*3/uL (ref 0.0–0.7)
LYMPHS PCT: 18 % (ref 12–46)
Lymphs Abs: 3.5 10*3/uL (ref 0.7–4.0)
MONOS PCT: 8 % (ref 3–12)
Monocytes Absolute: 1.5 10*3/uL — ABNORMAL HIGH (ref 0.1–1.0)
NEUTROS ABS: 13.4 10*3/uL — AB (ref 1.7–7.7)
Neutrophils Relative %: 70 % (ref 43–77)

## 2014-11-26 LAB — CBC WITH DIFFERENTIAL/PLATELET
BASO%: 0.9 % (ref 0.0–2.0)
Basophils Absolute: 0.2 10*3/uL — ABNORMAL HIGH (ref 0.0–0.1)
EOS%: 2.6 % (ref 0.0–7.0)
Eosinophils Absolute: 0.6 10*3/uL — ABNORMAL HIGH (ref 0.0–0.5)
HCT: 26.2 % — ABNORMAL LOW (ref 38.4–49.9)
HGB: 8.6 g/dL — ABNORMAL LOW (ref 13.0–17.1)
LYMPH#: 3.2 10*3/uL (ref 0.9–3.3)
LYMPH%: 14.6 % (ref 14.0–49.0)
MCH: 30.1 pg (ref 27.2–33.4)
MCHC: 32.7 g/dL (ref 32.0–36.0)
MCV: 92 fL (ref 79.3–98.0)
MONO#: 1.8 10*3/uL — AB (ref 0.1–0.9)
MONO%: 8.2 % (ref 0.0–14.0)
NEUT#: 16.1 10*3/uL — ABNORMAL HIGH (ref 1.5–6.5)
NEUT%: 73.7 % (ref 39.0–75.0)
Platelets: 448 10*3/uL — ABNORMAL HIGH (ref 140–400)
RBC: 2.85 10*6/uL — ABNORMAL LOW (ref 4.20–5.82)
RDW: 13.8 % (ref 11.0–14.6)
WBC: 21.8 10*3/uL — ABNORMAL HIGH (ref 4.0–10.3)

## 2014-11-26 LAB — COMPREHENSIVE METABOLIC PANEL (CC13)
ALT: 40 U/L (ref 0–55)
AST: 24 U/L (ref 5–34)
Albumin: 3.6 g/dL (ref 3.5–5.0)
Alkaline Phosphatase: 108 U/L (ref 40–150)
Anion Gap: 12 mEq/L — ABNORMAL HIGH (ref 3–11)
BUN: 20.8 mg/dL (ref 7.0–26.0)
CALCIUM: 9.7 mg/dL (ref 8.4–10.4)
CO2: 23 meq/L (ref 22–29)
CREATININE: 0.8 mg/dL (ref 0.7–1.3)
Chloride: 106 mEq/L (ref 98–109)
GLUCOSE: 100 mg/dL (ref 70–140)
Potassium: 4.2 mEq/L (ref 3.5–5.1)
SODIUM: 140 meq/L (ref 136–145)
Total Bilirubin: <0.2 mg/dL (ref 0.20–1.20)
Total Protein: 7.2 g/dL (ref 6.4–8.3)

## 2014-11-26 LAB — CBC
HCT: 26.8 % — ABNORMAL LOW (ref 39.0–52.0)
HEMOGLOBIN: 8.8 g/dL — AB (ref 13.0–17.0)
MCH: 31.3 pg (ref 26.0–34.0)
MCHC: 32.8 g/dL (ref 30.0–36.0)
MCV: 95.4 fL (ref 78.0–100.0)
Platelets: 474 10*3/uL — ABNORMAL HIGH (ref 150–400)
RBC: 2.81 MIL/uL — AB (ref 4.22–5.81)
RDW: 13.5 % (ref 11.5–15.5)
WBC: 19.2 10*3/uL — ABNORMAL HIGH (ref 4.0–10.5)

## 2014-11-26 LAB — APTT: aPTT: 30 seconds (ref 24–37)

## 2014-11-26 LAB — PROTIME-INR
INR: 1.1 (ref 0.00–1.49)
Prothrombin Time: 14.3 seconds (ref 11.6–15.2)

## 2014-11-26 LAB — MRSA PCR SCREENING: MRSA BY PCR: NEGATIVE

## 2014-11-26 LAB — I-STAT TROPONIN, ED: Troponin i, poc: 0.19 ng/mL (ref 0.00–0.08)

## 2014-11-26 LAB — ETHANOL: Alcohol, Ethyl (B): <5 mg/dL (ref <5)

## 2014-11-26 MED ORDER — ATORVASTATIN CALCIUM 80 MG PO TABS
80.0000 mg | ORAL_TABLET | Freq: Every day | ORAL | Status: DC
  Administered 2014-11-27 – 2014-12-01 (×5): 80 mg via ORAL
  Filled 2014-11-26 (×6): qty 1

## 2014-11-26 MED ORDER — FENTANYL CITRATE (PF) 100 MCG/2ML IJ SOLN
25.0000 ug | INTRAMUSCULAR | Status: DC | PRN
  Administered 2014-11-27 – 2014-11-28 (×14): 75 ug via INTRAVENOUS
  Filled 2014-11-26 (×15): qty 2

## 2014-11-26 MED ORDER — SODIUM CHLORIDE 0.9 % IV SOLN: 250.0000 mL | INTRAVENOUS | Status: DC | PRN

## 2014-11-26 MED ORDER — FENTANYL CITRATE (PF) 100 MCG/2ML IJ SOLN
INTRAMUSCULAR | Status: AC
  Administered 2014-11-26: 100 ug
  Filled 2014-11-26: qty 2

## 2014-11-26 MED ORDER — PANTOPRAZOLE SODIUM 40 MG IV SOLR
40.0000 mg | Freq: Every day | INTRAVENOUS | Status: DC
  Administered 2014-11-26: 40 mg via INTRAVENOUS
  Filled 2014-11-26: qty 40

## 2014-11-26 MED ORDER — ONDANSETRON HCL 4 MG/2ML IJ SOLN
4.0000 mg | Freq: Three times a day (TID) | INTRAMUSCULAR | Status: DC | PRN
  Administered 2014-11-27 (×3): 4 mg via INTRAVENOUS
  Filled 2014-11-26 (×3): qty 2

## 2014-11-26 MED ORDER — PROTAMINE SULFATE 10 MG/ML IV SOLN
50.0000 mg | Freq: Once | INTRAVENOUS | Status: AC
  Administered 2014-11-26: 50 mg via INTRAVENOUS
  Filled 2014-11-26: qty 5

## 2014-11-26 MED ORDER — SODIUM CHLORIDE 0.9 % IJ SOLN: 3.0000 mL | INTRAMUSCULAR | Status: DC | PRN

## 2014-11-26 MED ORDER — ONDANSETRON HCL 4 MG/2ML IJ SOLN
INTRAMUSCULAR | Status: AC
  Administered 2014-11-26: 4 mg
  Filled 2014-11-26: qty 2

## 2014-11-26 MED ORDER — SODIUM CHLORIDE 0.9 % IJ SOLN
3.0000 mL | Freq: Two times a day (BID) | INTRAMUSCULAR | Status: DC
  Administered 2014-11-26 – 2014-11-28 (×2): 3 mL via INTRAVENOUS
  Administered 2014-11-29: 10 mL via INTRAVENOUS
  Administered 2014-11-30: 3 mL via INTRAVENOUS

## 2014-11-26 NOTE — ED Notes (Signed)
Patient transported to CT 

## 2014-11-26 NOTE — Progress Notes (Signed)
Patient transported to ED via wheelchair with 2 liters of oxygen via nasal canula per Selena Lesser, NP. Report given to Freda Munro, [place].

## 2014-11-26 NOTE — ED Notes (Signed)
Dr. Ralene Bathe notified of patient c/o left numbness around the mouth and a change in his speech. CT called and to get patient asap for scan.

## 2014-11-26 NOTE — ED Notes (Addendum)
Notified edp Ayesha Rumpf and [employer]

## 2014-11-26 NOTE — Progress Notes (Signed)
[facility] Progress Note Patient Name: [name] DOB: Mar 15, 1961 MRN: [id number]   Date of Service  12/01/2014  HPI/Events of Note  Patient c/o of left shoulder pain which has been going on for at least 3 weeks.  Requesting pain medication.  Also with N/V.  eICU Interventions  Plan: Fentanyl 25 to 75 mcg q2 hours prn pain. Zofran prn for N/V Check cardiac enzymes     Intervention Category Intermediate Interventions: Pain - evaluation and management;Other:  Hermelinda Diegel 11/25/2014, 11:30 PM

## 2014-11-26 NOTE — Progress Notes (Signed)
Patient assessed in infusion room. Patient complains of left sided numbness and tingling intermittent since last night. Patient complains he was unable to express his words to his wife while waiting to be called back to the infusion room. Patient states he has indigestion. Patient denies nausea, vomiting, or shortness of breath. Selena Lesser, NP at chairside.

## 2014-11-26 NOTE — ED Notes (Signed)
Pt is unable to give any urine at this time.will info staff when can

## 2014-11-26 NOTE — Consult Note (Signed)
Reason for Consult:  Small amount of spontaneous sulcal subarachnoid hemorrhage in patient on aspirin, Plavix, and Lovenox Referring Physician:  Dr. Quintella Reichert (EDP)  [name] is an 54 y.o. right-handed white male.  HPI: Patient transferred from the lung cancer clinic (Dr. Fonda Parsons) at the [facility] this afternoon after developing acute speech difficulties for stroke assessment.  Patient and his wife explain that he was diagnosed with a right lung pulmonary embolism and a left lung cancer in February of this year. Patient underwent thoracic surgery with Dr. [name] last month, and pathology revealed poorly differentiated carcinoma. Patient was to receive his first chemotherapy today at [facility], but after going to the bathroom, developed difficulty with speech. He says his symptoms lasted about 10 minutes, but when he discussed the difficulties with the staff, they did a stroke assessment, and the patient was transferred to the [facility] emergency room for further evaluation. CT of the head without contrast was obtained by Dr. Quintella Reichert (EDP).  Dr. Jim Like from neurology was consulted regarding the stroke difficulties.  Patient's history is notable for myocardial infarctions in 2006 and 2012, he's had a number of stents placed, and has been taking aspirin and Plavix 75 mg daily. Patient was also started on Lovenox for the pulmonary embolism diagnosed in February of this year, and continues on 100 mg daily. History is also notable for hypertension, GERD, gout, and hyperlipidemia.  Symptomatically the patient denies any headache, double vision, blurred vision, focal weakness, seizures, or speech difficulties. He denies any nausea and vomiting, and in fact is hungry and thirsty at this time.  CT of the brain without contrast showed a small amount of sulcal subarachnoid hemorrhage over the right hemisphere.  Dr. Janann Colonel recommended to  Dr. Ralene Bathe neurosurgical consultation, because of the spontaneous subarachnoid hemorrhage, despite it clearly not being of an aneurysmal distribution. The patient is now seen in neurosurgical consultation at Dr. [name] request.  Arrangements have been made, between Dr. Ralene Bathe and Dr. Merrie Roof (CCM), for the patient to be transferred to the critical care medicine service at [facility]  Past Medical History:  Past Medical History  Diagnosis Date  . HLD (hyperlipidemia)   . HTN (hypertension)     "took me off my BP pills" (06/20/2014)  . GERD (gastroesophageal reflux disease)     hx (06/20/2014)  . History of gout   . Myocardial infarction 02/2005; 05/2011  . Pulmonary embolism 09/21/2014    on Lovenox  . Lung mass 08/2014  . Coronary artery disease     a. s/p Taxus DES to CFX 8/06 in setting of NSTEMI;  b.  s/p Inf STEMI 06/21/11:  mRCA occluded>> PCI:  Overlapping Promus DES x 2 to RCA.;  c.  LHC (11/15):  pLAD 20-30%, oDx branches 50-60%, mCFX stent 95% ISR, dCFX stent ok, mRCA stent occluded with L-R collats, EF 50-55%, basal inf AK, mid Inf HK >> PCI:  3.25 x 23 mm Xience DES to [place]    Past Surgical History:  Past Surgical History  Procedure Laterality Date  . Left heart cath N/A 06/21/2011    Procedure: LEFT HEART CATH;  Surgeon: [name], MD;  Location[facility] C[facility] LAB;  Service: Cardiovascular;  Laterality: N/A;  . Left heart catheterization with coronary angiogram N/A 06/20/2014    Procedure: LEFT HEART CATHETERIZATION WITH CORONARY ANGIOGRAM;  Surgeon: Peter M Martinique, MD;  Location: [facility] CATH LAB;  Service: Cardiovascular;  Laterality: N/A;  . Coronary angioplasty with stent placement  02/2005; 05/2011; 06/20/2014    "1; ?2; 1"  . Esophagogastroduodenoscopy  2004  . Colonoscopy w/ polypectomy    . Video bronchoscopy with endobronchial ultrasound N/A 11/05/2014    Procedure: VIDEO BRONCHOSCOPY WITH ENDOBRONCHIAL ULTRASOUND;  Surgeon: [name], MD;   Location: [facility];  Service: Thoracic;  Laterality: N/A;  . Mediastinoscopy N/A 11/05/2014    Procedure:  MEDIASTINOSCOPY;  Surgeon: [name], MD;  Location: [facility] OR;  Service: Thoracic;  Laterality: N/A;    Family History:  Family History  Problem Relation Age of Onset  . Hypertension Father     Social History:  reports that he has been smoking Cigarettes.  He has a 39 pack-year smoking history. He has never used smokeless tobacco. He reports that he uses illicit drugs (Marijuana). He reports that he does not drink alcohol.  Allergies: No Known Allergies  Medications: I have reviewed the patient's current medications.  ROS:  Notable for those difficulties described in his history of present illness and past medical history, but is otherwise unremarkable.  Physical Examination: Well-developed, well-nourished, white male in no acute distress. Blood pressure 145/80, pulse 91, temperature 98.3 F (36.8 C), temperature source Oral, resp. rate 13, SpO2 98 %. Lungs:  Clear to auscultation, symmetrical respiratory excursion. Heart:  Normal S1 and S2, no murmur. Abdomen:  Soft, nondistended, bowel sounds present.  Neurological Examination: Mental Status Examination:  Awake and alert, oriented to his name, [facility], [place], 2014/12/07. Speech is fluent. He has good comprehension. Cranial Nerve Examination:  Pupils 3 mm, round, reactive to light. EOMI. Facial sensation intact. Facial movements symmetrical. Hearing present bilaterally. Palatal movements symmetrical. Shoulder shrug symmetrical. Tongue midline. Motor Examination:  5/5 strength in the upper and lower extremities. No drift of the upper extremities. Sensory Examination:  Intact to pinprick to the upper and lower extremities. Reflex Examination:   1+ in the upper extremities, 1-2+ in the lower extremities. Toes are downgoing bilaterally.   Results for orders placed or performed during the hospital  encounter of 11/25/2014 (from the past 48 hour(s))  Protime-INR     Status: None   Collection Time: 12/16/2014  3:35 PM  Result Value Ref Range   Prothrombin Time 14.3 11.6 - 15.2 seconds   INR 1.10 0.00 - 1.49  APTT     Status: None   Collection Time: 12/13/2014  3:35 PM  Result Value Ref Range   aPTT 30 24 - 37 seconds  CBC     Status: Abnormal   Collection Time: 11/29/2014  3:35 PM  Result Value Ref Range   WBC 19.2 (H) 4.0 - 10.5 K/uL   RBC 2.81 (L) 4.22 - 5.81 MIL/uL   Hemoglobin 8.8 (L) 13.0 - 17.0 g/dL   HCT 26.8 (L) 39.0 - 52.0 %   MCV 95.4 78.0 - 100.0 fL   MCH 31.3 26.0 - 34.0 pg   MCHC 32.8 30.0 - 36.0 g/dL   RDW 13.5 11.5 - 15.5 %   Platelets 474 (H) 150 - 400 K/uL  Differential     Status: Abnormal   Collection Time: 11/29/2014  3:35 PM  Result Value Ref Range   Neutrophils Relative % 70 43 - 77 %   Lymphocytes Relative 18 12 - 46 %   Monocytes Relative 8 3 - 12 %   Eosinophils Relative 3 0 - 5 %   Basophils Relative 1 0 - 1 %  Neutro Abs 13.4 (H) 1.7 - 7.7 K/uL   Lymphs Abs 3.5 0.7 - 4.0 K/uL   Monocytes Absolute 1.5 (H) 0.1 - 1.0 K/uL   Eosinophils Absolute 0.6 0.0 - 0.7 K/uL   Basophils Absolute 0.2 (H) 0.0 - 0.1 K/uL   RBC Morphology POLYCHROMASIA PRESENT   Comprehensive metabolic panel     Status: Abnormal   Collection Time: 12/13/2014  3:35 PM  Result Value Ref Range   Sodium 140 135 - 145 mmol/L   Potassium 3.9 3.5 - 5.1 mmol/L   Chloride 106 101 - 111 mmol/L   CO2 24 22 - 32 mmol/L   Glucose, Bld 103 (H) 70 - 99 mg/dL   BUN 24 (H) 6 - 20 mg/dL   Creatinine, Ser 0.85 0.61 - 1.24 mg/dL   Calcium 9.8 8.9 - 10.3 mg/dL   Total Protein 7.8 6.5 - 8.1 g/dL   Albumin 4.3 3.5 - 5.0 g/dL   AST 31 15 - 41 U/L   ALT 44 17 - 63 U/L   Alkaline Phosphatase 111 38 - 126 U/L   Total Bilirubin 0.2 (L) 0.3 - 1.2 mg/dL   GFR calc non Af Amer >60 >60 mL/min   GFR calc Af Amer >60 >60 mL/min    Comment: (NOTE) The eGFR has been calculated using the CKD EPI equation. This  calculation has not been validated in all clinical situations. eGFR's persistently <90 mL/min signify possible Chronic Kidney Disease.    Anion gap 10 5 - 15  Ethanol     Status: None   Collection Time: 11/28/2014  3:37 PM  Result Value Ref Range   Alcohol, Ethyl (B) <5 <5 mg/dL    Comment:        LOWEST DETECTABLE LIMIT FOR SERUM ALCOHOL IS 11 mg/dL FOR MEDICAL PURPOSES ONLY   I-stat troponin, ED (not at [facility])     Status: Abnormal   Collection Time: 12/23/2014  3:41 PM  Result Value Ref Range   Troponin i, poc 0.19 (HH) 0.00 - 0.08 ng/mL   Comment NOTIFIED PHYSICIAN    Comment 3            Comment: Due to the release kinetics of cTnI, a negative result within the first hours of the onset of symptoms does not rule out myocardial infarction with certainty. If myocardial infarction is still suspected, repeat the test at appropriate intervals.   Urinalysis, Routine w reflex microscopic     Status: None   Collection Time: 11/25/2014  6:40 PM  Result Value Ref Range   Color, Urine YELLOW YELLOW   APPearance CLEAR CLEAR   Specific Gravity, Urine 1.024 1.005 - 1.030   pH 6.0 5.0 - 8.0   Glucose, UA NEGATIVE NEGATIVE mg/dL   Hgb urine dipstick NEGATIVE NEGATIVE   Bilirubin Urine NEGATIVE NEGATIVE   Ketones, ur NEGATIVE NEGATIVE mg/dL   Protein, ur NEGATIVE NEGATIVE mg/dL   Urobilinogen, UA 0.2 0.0 - 1.0 mg/dL   Nitrite NEGATIVE NEGATIVE   Leukocytes, UA NEGATIVE NEGATIVE    Comment: MICROSCOPIC NOT DONE ON URINES WITH NEGATIVE PROTEIN, BLOOD, LEUKOCYTES, NITRITE, OR GLUCOSE <1000 mg/dL.    Dg Chest 2 View  12/21/2014   CLINICAL DATA:  Numbness. Dysphagia. LEFT-sided numbness. History of cardiac problems.  EXAM: CHEST  2 VIEW  COMPARISON:  None.  FINDINGS: Spiculated LEFT upper lobe mass appears similar to PET-CT 09/21/2014. Fullness of the RIGHT peritracheal soft tissues most compatible with lymphadenopathy. This is a new finding compared to  09/21/2014. There is no airspace  disease. No pleural effusion. Cardiac stent noted. Monitoring leads project over the chest. Cardiopericardial silhouette within normal limits.  IMPRESSION: 1. Unchanged LEFT upper lobe spiculated pulmonary nodule. 2. RIGHT peritracheal and LEFT hilar adenopathy.   Electronically Signed   By: Dereck Ligas M.D.   On: 12/05/2014 15:50   Ct Head Wo Contrast  12/02/2014   CLINICAL DATA:  LEFT side facial numbness beginning last night, recently diagnosed with lung cancer, planned chemotherapy to start today but was to week, past history of coronary artery disease post MI, hypertension, smoker  EXAM: CT HEAD WITHOUT CONTRAST  TECHNIQUE: Contiguous axial images were obtained from the base of the skull through the vertex without intravenous contrast.  COMPARISON:  None; correlation MRI brain 09/22/2014  FINDINGS: Normal ventricular morphology.  No midline shift or mass effect.  High attenuation subarachnoid hemorrhage identified within a sulcus in the RIGHT parietal lobe.  No intraparenchymal hemorrhage, definite mass lesion, or evidence acute infarction.  No additional extra-axial collections.  Atherosclerotic calcifications at the carotid siphons.  Bones unremarkable.  RIGHT ethmoid air cell opacification noted.  IMPRESSION: Acute subarachnoid hemorrhage within a sulcus at the RIGHT parietal lobe.  No other intracranial abnormalities.  Critical Value/emergent results were called by telephone at the time of interpretation on 12/18/2014 at 1722 hr to Dr. Quintella Reichert , who verbally acknowledged these results.   Electronically Signed   By: Lavonia Dana M.D.   On: 12/13/2014 17:23     Assessment/Plan: Patient with relatively newly diagnosed (February 2016) lung cancer, with a history of ASCVD, myocardial infarction, hypertension, hyperlipidemia, GERD, and gout, as well as relatively newly diagnosed (February 2016) pulmonary embolism, who has been on aspirin, Plavix, and Lovenox injections, who this afternoon had a  approximately 10 minute episode of speech difficulties, suspicious for stroke. CT the brain without contrast shows a small amount of sulcal subarachnoid hemorrhage over the right hemisphere.  The distribution of the spontaneous subarachnoid hemorrhage is not consistent with aneurysmal subarachnoid hemorrhage, and is most likely contributed to by his antiplatelet therapy and anticoagulation, with aspirin, Plavix, and Lovenox. Further, athough the patient did have an MRI of the brain in February 2016 that was unremarkable, contribution to this small amount of intracranial hemorrhage could be related to his malignant disease, possibly brain metastases or paraneoplastic syndrome. MRI the brain without and with gadolinium should be repeated.  From a neurosurgical perspective certainly his aspirin, Plavix, and Lovenox need to be discontinued because of the intracranial hemorrhage. However it'll be important for his treating physicians to decide about the question of whether to place a IVC filter because of his history of pulmonary embolism in February 2016.  Input from [place], neurology, and his medical oncologist (Dr. Fonda Parsons) will be important in addressing these issues.  Hosie Spangle, MD 11/25/2014, 8:19 PM

## 2014-11-26 NOTE — ED Notes (Signed)
Pt, sent by [facility], c/o intermittent numbness/tingling to L face, arm and leg and dysphagia x 2 episodes.  Symptoms have currently resolved.  Pt woke up w/ complaint.  Pt recently diagnosed w/ lung CA.   Pt was supposed to have first chemo treatment today.  Pt had a significant decrease in Hgb.     Oncology provider: Jenny Reichmann [phone]

## 2014-11-26 NOTE — ED Provider Notes (Signed)
CSN: [id number]     Arrival date & time 11/25/2014  1422 History   First MD Initiated Contact with Patient 12/01/2014 1508     Chief Complaint  Patient presents with  . Numbness  . Dysphagia     The history is provided by the patient and the spouse. No language interpreter was used.   [name] presents for left sided numbness and aphasia.  He developed numbness in his left face, tongue and left arm last night.  His numbness improved today but then he developed difficulty speaking when he was in the infusion center for his first chemotherapy treatment for nonsmall cell lung cancer.  His numbness and speech difficulties have completely resolved.  He is currently on lovenox for PE and completing treatment for pneumonia.  Sxs are moderate, waxing and waning, resolved.    Past Medical History  Diagnosis Date  . HLD (hyperlipidemia)   . HTN (hypertension)     "took me off my BP pills" (06/20/2014)  . GERD (gastroesophageal reflux disease)     hx (06/20/2014)  . History of gout   . Myocardial infarction 02/2005; 05/2011  . Pulmonary embolism 09/21/2014    on Lovenox  . Lung mass 08/2014  . Coronary artery disease     a. s/p Taxus DES to CFX 8/06 in setting of NSTEMI;  b.  s/p Inf STEMI 06/21/11:  mRCA occluded>> PCI:  Overlapping Promus DES x 2 to RCA.;  c.  LHC (11/15):  pLAD 20-30%, oDx branches 50-60%, mCFX stent 95% ISR, dCFX stent ok, mRCA stent occluded with L-R collats, EF 50-55%, basal inf AK, mid Inf HK >> PCI:  3.25 x 23 mm Xience DES to [facility]   Past Surgical History  Procedure Laterality Date  . Left heart cath N/A 06/21/2011    Procedure: LEFT HEART CATH;  Surgeon: [name], MD;  Location: [facility] CATH LAB;  Service: Cardiovascular;  Laterality: N/A;  . Left heart catheterization with coronary angiogram N/A 06/20/2014    Procedure: LEFT HEART CATHETERIZATION WITH CORONARY ANGIOGRAM;  Surgeon: Peter M Martinique, MD;  Location: [facility] CATH LAB;  Service: Cardiovascular;  Laterality: N/A;  .  Coronary angioplasty with stent placement  02/2005; 05/2011; 06/20/2014    "1; ?2; 1"  . Esophagogastroduodenoscopy  2004  . Colonoscopy w/ polypectomy    . Video bronchoscopy with endobronchial ultrasound N/A 11/05/2014    Procedure: VIDEO BRONCHOSCOPY WITH ENDOBRONCHIAL ULTRASOUND;  Surgeon: [name], MD;  Location: [place];  Service: Thoracic;  Laterality: N/A;  . Mediastinoscopy N/A 11/05/2014    Procedure:  MEDIASTINOSCOPY;  Surgeon: [name], MD;  Location: [facility] OR;  Service: Thoracic;  Laterality: N/A;   Family History  Problem Relation Age of Onset  . Hypertension Father    History  Substance Use Topics  . Smoking status: Current Some Day Smoker -- 1.00 packs/day for 39 years    Types: Cigarettes  . Smokeless tobacco: Never Used     Comment: pt has not smoked in 2 days.  . Alcohol Use: No    Review of Systems  Gastrointestinal: Positive for diarrhea.  All other systems reviewed and are negative.     Allergies  Review of patient's allergies indicates no known allergies.  Home Medications   Prior to Admission medications   Medication Sig Start Date End Date Taking? Authorizing Provider  aspirin 81 MG tablet Take 81 mg by mouth daily.    Historical Provider, MD  atorvastatin (LIPITOR) 80 MG tablet TAKE  ONE TABLET BY MOUTH ONCE DAILY 08/06/14   [name], MD  clopidogrel (PLAVIX) 75 MG tablet TAKE ONE TABLET BY MOUTH ONCE DAILY 07/17/14   [name], MD  diphenhydramine-acetaminophen (TYLENOL PM) 25-500 MG TABS Take 1 tablet by mouth at bedtime.    Historical Provider, MD  enoxaparin (LOVENOX) 100 MG/ML injection Inject 1 mL (100 mg total) into the skin daily. 10/15/14   Tanda Rockers, MD  metoprolol tartrate (LOPRESSOR) 25 MG tablet TAKE ONE-HALF TABLET BY MOUTH TWICE DAILY 01/22/14   [name], MD  nitroGLYCERIN (NITROSTAT) 0.4 MG SL tablet Place 1 tablet (0.4 mg total) under the tongue every 5 (five) minutes as needed for chest pain.  06/21/14   Liliane Shi, PA-C  prochlorperazine (COMPAZINE) 10 MG tablet Take 1 tablet (10 mg total) by mouth every 6 (six) hours as needed for nausea or vomiting. 11/15/14   Curt Bears, MD  sildenafil (VIAGRA) 50 MG tablet Take 50 mg by mouth daily as needed for erectile dysfunction.    Historical Provider, MD   BP 120/85 mmHg  Pulse 95  Temp(Src) 97.9 F (36.6 C) (Oral)  Resp 22  SpO2 100% Physical Exam  Constitutional: He is oriented to person, place, and time. He appears well-developed and well-nourished.  HENT:  Head: Normocephalic and atraumatic.  Eyes: EOM are normal. Pupils are equal, round, and reactive to light.  Cardiovascular: Normal rate and regular rhythm.   No murmur heard. Pulmonary/Chest: Effort normal and breath sounds normal. No respiratory distress.  Abdominal: Soft. There is no tenderness. There is no rebound and no guarding.  Musculoskeletal: He exhibits no edema or tenderness.  Neurological: He is alert and oriented to person, place, and time. No cranial nerve deficit. Coordination normal.  5/5 strength in all four extremities.  Sensation to light touch intact in all four extremities.    Skin: Skin is warm and dry.  Psychiatric: He has a normal mood and affect. His behavior is normal.  Nursing note and vitals reviewed.   ED Course  Procedures (including critical care time) CRITICAL CARE Performed by: Quintella Reichert   Total critical care time: 30 minutes  Critical care time was exclusive of separately billable procedures and treating other patients.  Critical care was necessary to treat or prevent imminent or life-threatening deterioration.  Critical care was time spent personally by me on the following activities: development of treatment plan with patient and/or surrogate as well as nursing, discussions with consultants, evaluation of patient's response to treatment, examination of patient, obtaining history from patient or surrogate, ordering and  performing treatments and interventions, ordering and review of laboratory studies, ordering and review of radiographic studies, pulse oximetry and re-evaluation of patient's condition.  Labs Review Labs Reviewed  CBC - Abnormal; Notable for the following:    WBC 19.2 (*)    RBC 2.81 (*)    Hemoglobin 8.8 (*)    HCT 26.8 (*)    Platelets 474 (*)    All other components within normal limits  DIFFERENTIAL - Abnormal; Notable for the following:    Neutro Abs 13.4 (*)    Monocytes Absolute 1.5 (*)    Basophils Absolute 0.2 (*)    All other components within normal limits  COMPREHENSIVE METABOLIC PANEL - Abnormal; Notable for the following:    Glucose, Bld 103 (*)    BUN 24 (*)    Total Bilirubin 0.2 (*)    All other components within normal limits  I-STAT TROPOININ, ED - Abnormal;  Notable for the following:    Troponin i, poc 0.19 (*)    All other components within normal limits  MRSA PCR SCREENING  ETHANOL  PROTIME-INR  APTT  URINALYSIS, ROUTINE W REFLEX MICROSCOPIC    Imaging Review Dg Chest 2 View  12/22/2014   CLINICAL DATA:  Numbness. Dysphagia. LEFT-sided numbness. History of cardiac problems.  EXAM: CHEST  2 VIEW  COMPARISON:  None.  FINDINGS: Spiculated LEFT upper lobe mass appears similar to PET-CT 09/21/2014. Fullness of the RIGHT peritracheal soft tissues most compatible with lymphadenopathy. This is a new finding compared to 09/21/2014. There is no airspace disease. No pleural effusion. Cardiac stent noted. Monitoring leads project over the chest. Cardiopericardial silhouette within normal limits.  IMPRESSION: 1. Unchanged LEFT upper lobe spiculated pulmonary nodule. 2. RIGHT peritracheal and LEFT hilar adenopathy.   Electronically Signed   By: Dereck Ligas M.D.   On: 12/13/2014 15:50   Ct Head Wo Contrast  12/25/2014   CLINICAL DATA:  LEFT side facial numbness beginning last night, recently diagnosed with lung cancer, planned chemotherapy to start today but was to week,  past history of coronary artery disease post MI, hypertension, smoker  EXAM: CT HEAD WITHOUT CONTRAST  TECHNIQUE: Contiguous axial images were obtained from the base of the skull through the vertex without intravenous contrast.  COMPARISON:  None; correlation MRI brain 09/22/2014  FINDINGS: Normal ventricular morphology.  No midline shift or mass effect.  High attenuation subarachnoid hemorrhage identified within a sulcus in the RIGHT parietal lobe.  No intraparenchymal hemorrhage, definite mass lesion, or evidence acute infarction.  No additional extra-axial collections.  Atherosclerotic calcifications at the carotid siphons.  Bones unremarkable.  RIGHT ethmoid air cell opacification noted.  IMPRESSION: Acute subarachnoid hemorrhage within a sulcus at the RIGHT parietal lobe.  No other intracranial abnormalities.  Critical Value/emergent results were called by telephone at the time of interpretation on 12/10/2014 at 1722 hr to Dr. Quintella Reichert , who verbally acknowledged these results.   Electronically Signed   By: Lavonia Dana M.D.   On: 12/02/2014 17:23     EKG Interpretation None      MDM   Final diagnoses:  Subarachnoid hemorrhage    Patient here for intermittent paresthesias of the left face and arm, expressive aphasia. CT scan demonstrates subarachnoid hemorrhage. Discussed with Neuro Hospitalist, who recommends neurosurgical consult. Discussed with Dr. Sherwood Gambler with neurosurgery, who recommends medical admission to ICU for serial examinations. Discussed with intensivist, Dr. Titus Mould, who recommends transfer to [facility] for further monitoring. Initial order for [employer] to reverse lovenox, this was discontinued and patient was given protamine instead.    Quintella Reichert, MD 11/29/2014 2330

## 2014-11-26 NOTE — ED Notes (Signed)
Bed: RESB Expected date:  Expected time:  Means of arrival:  Comments: Pt from [place] Pt

## 2014-11-26 NOTE — ED Notes (Signed)
Delay in lab draw pt in exray 

## 2014-11-27 ENCOUNTER — Inpatient Hospital Stay (HOSPITAL_COMMUNITY): Payer: 59

## 2014-11-27 ENCOUNTER — Ambulatory Visit: Payer: 59 | Admitting: Radiation Oncology

## 2014-11-27 ENCOUNTER — Ambulatory Visit
Admit: 2014-11-27 | Discharge: 2014-11-27 | Disposition: A | Discharge status: Home / Self Care | Payer: 59 | Attending: Radiation Oncology | Admitting: Radiation Oncology

## 2014-11-27 ENCOUNTER — Other Ambulatory Visit: Payer: Self-pay

## 2014-11-27 ENCOUNTER — Encounter: Payer: Self-pay | Admitting: Radiation Oncology

## 2014-11-27 ENCOUNTER — Other Ambulatory Visit: Payer: Self-pay | Admitting: *Deleted

## 2014-11-27 DIAGNOSIS — Z510 Encounter for antineoplastic radiation therapy: Secondary | ICD-10-CM | POA: Diagnosis not present

## 2014-11-27 DIAGNOSIS — I749 Embolism and thrombosis of unspecified artery: Secondary | ICD-10-CM

## 2014-11-27 DIAGNOSIS — R4182 Altered mental status, unspecified: Secondary | ICD-10-CM

## 2014-11-27 DIAGNOSIS — I2699 Other pulmonary embolism without acute cor pulmonale: Secondary | ICD-10-CM | POA: Insufficient documentation

## 2014-11-27 DIAGNOSIS — I214 Non-ST elevation (NSTEMI) myocardial infarction: Secondary | ICD-10-CM

## 2014-11-27 DIAGNOSIS — I609 Nontraumatic subarachnoid hemorrhage, unspecified: Principal | ICD-10-CM

## 2014-11-27 DIAGNOSIS — C7931 Secondary malignant neoplasm of brain: Secondary | ICD-10-CM | POA: Insufficient documentation

## 2014-11-27 LAB — BASIC METABOLIC PANEL
Anion gap: 11 (ref 5–15)
BUN: 17 mg/dL (ref 6–20)
CO2: 22 mmol/L (ref 22–32)
CREATININE: 0.84 mg/dL (ref 0.61–1.24)
Calcium: 9.1 mg/dL (ref 8.9–10.3)
Chloride: 102 mmol/L (ref 101–111)
GFR calc Af Amer: >60 mL/min (ref >60)
GFR calc non Af Amer: >60 mL/min (ref >60)
GLUCOSE: 115 mg/dL — AB (ref 70–99)
Potassium: 3.7 mmol/L (ref 3.5–5.1)
SODIUM: 135 mmol/L (ref 135–145)

## 2014-11-27 LAB — TROPONIN I
TROPONIN I: 0.62 ng/mL — AB (ref <0.031)
TROPONIN I: 1.07 ng/mL — AB (ref <0.031)
Troponin I: 0.32 ng/mL — ABNORMAL HIGH (ref <0.031)

## 2014-11-27 LAB — CBC
HCT: 23.5 % — ABNORMAL LOW (ref 39.0–52.0)
HEMOGLOBIN: 7.8 g/dL — AB (ref 13.0–17.0)
MCH: 30.6 pg (ref 26.0–34.0)
MCHC: 33.2 g/dL (ref 30.0–36.0)
MCV: 92.2 fL (ref 78.0–100.0)
PLATELETS: 382 10*3/uL (ref 150–400)
RBC: 2.55 MIL/uL — AB (ref 4.22–5.81)
RDW: 13.7 % (ref 11.5–15.5)
WBC: 16.1 10*3/uL — ABNORMAL HIGH (ref 4.0–10.5)

## 2014-11-27 LAB — PHOSPHORUS: Phosphorus: 3.2 mg/dL (ref 2.5–4.6)

## 2014-11-27 LAB — MAGNESIUM: Magnesium: 1.8 mg/dL (ref 1.7–2.4)

## 2014-11-27 MED ORDER — FENTANYL CITRATE (PF) 100 MCG/2ML IJ SOLN
INTRAMUSCULAR | Status: DC | PRN
  Administered 2014-11-27: 25 ug via INTRAVENOUS

## 2014-11-27 MED ORDER — FENTANYL CITRATE (PF) 100 MCG/2ML IJ SOLN
INTRAMUSCULAR | Status: AC
  Filled 2014-11-27: qty 2

## 2014-11-27 MED ORDER — ONDANSETRON HCL 4 MG/2ML IJ SOLN
4.0000 mg | INTRAMUSCULAR | Status: DC | PRN
  Administered 2014-11-27 – 2014-11-28 (×2): 4 mg via INTRAVENOUS
  Filled 2014-11-27 (×2): qty 2

## 2014-11-27 MED ORDER — SODIUM CHLORIDE 0.9 % IV SOLN
INTRAVENOUS | Status: DC | PRN
  Administered 2014-11-27: 10 mL/h via INTRAVENOUS

## 2014-11-27 MED ORDER — MIDAZOLAM HCL 2 MG/2ML IJ SOLN
INTRAMUSCULAR | Status: AC
  Filled 2014-11-27: qty 2

## 2014-11-27 MED ORDER — SODIUM CHLORIDE 0.9 % IV SOLN: 250.0000 mL | INTRAVENOUS | Status: DC | PRN

## 2014-11-27 MED ORDER — MIDAZOLAM HCL 2 MG/2ML IJ SOLN
INTRAMUSCULAR | Status: DC | PRN
  Administered 2014-11-27: 1 mg via INTRAVENOUS

## 2014-11-27 MED ORDER — METOPROLOL TARTRATE 12.5 MG HALF TABLET
12.5000 mg | ORAL_TABLET | Freq: Two times a day (BID) | ORAL | Status: DC
  Administered 2014-11-27 – 2014-11-29 (×5): 12.5 mg via ORAL
  Filled 2014-11-27 (×8): qty 1

## 2014-11-27 MED ORDER — IOHEXOL 300 MG/ML  SOLN
150.0000 mL | Freq: Once | INTRAMUSCULAR | Status: AC | PRN
  Administered 2014-11-27: 50 mL via INTRAVENOUS

## 2014-11-27 MED ORDER — LIDOCAINE HCL 1 % IJ SOLN
INTRAMUSCULAR | Status: AC
  Filled 2014-11-27: qty 20

## 2014-11-27 MED ORDER — DEXAMETHASONE SODIUM PHOSPHATE 4 MG/ML IJ SOLN
4.0000 mg | Freq: Once | INTRAMUSCULAR | Status: AC
  Administered 2014-11-27: 4 mg via INTRAVENOUS
  Filled 2014-11-27: qty 1

## 2014-11-27 MED ORDER — PROMETHAZINE HCL 25 MG/ML IJ SOLN
12.5000 mg | Freq: Four times a day (QID) | INTRAMUSCULAR | Status: DC | PRN
  Administered 2014-11-29 (×2): 12.5 mg via INTRAVENOUS
  Filled 2014-11-27 (×3): qty 1

## 2014-11-27 MED ORDER — GADOBENATE DIMEGLUMINE 529 MG/ML IV SOLN
15.0000 mL | Freq: Once | INTRAVENOUS | Status: AC | PRN
  Administered 2014-11-27: 15 mL via INTRAVENOUS

## 2014-11-27 MED ORDER — LORAZEPAM 1 MG PO TABS
1.0000 mg | ORAL_TABLET | Freq: Four times a day (QID) | ORAL | Status: DC | PRN
  Administered 2014-11-28 – 2014-11-30 (×4): 1 mg via ORAL
  Filled 2014-11-27 (×4): qty 1

## 2014-11-27 NOTE — Progress Notes (Signed)
STROKE TEAM PROGRESS NOTE   HISTORY [name] is an 54 y.o. male with a past medical history significant for HTN, hyperlipidemia, CAD s/p stenting, MI, smoking, newly diagnosed left lung cancer on 2/16, right PE, transferred to [facility] for further evaluation and management of right sulcal SAH. Patient indicated that he was at the [facility] yesterday, about to initiate his chemotherapy, but after going to the bathroom he developed sudden onset of difficulty expressing himself that he describes as " knowing what I wanted to say but couldn't get anything out". The episode lasted for at least 10 minutes and he recalls that he also had some numbness-tingling around the left side of his mouth that then travelled to the left hand. No associated HA, vertigo, double vision, confusion, focal weakness, or vision impairment. Of note, he tells me that the day before he had " numbness and tingling of the left lower face and left arm lasting for 10 or 15 minutes". His LKW was 11/29/2014 at 1400. Patient was sent to [facility] where he had a CT brain that I reviewed myself and demonstrated a small acute subarachnoid hemorrhage within a sulcus at the RIGHT parietal lobe. [name] has been on a combination of aspirin and plavix since previous MI and Lovenox 100 mg was added this past February for PE treatment. Patient was not administered TPA secondary to [name]. He was admitted to the neuro ICU for further evaluation and treatment.   SUBJECTIVE (INTERVAL HISTORY) His wife is at the bedside.  Overall he feels his condition is stable. Wife is concerned with him taking lovenox and wants him off of it.   OBJECTIVE Temp:  [97.7 F (36.5 C)-98.6 F (37 C)] 98 F (36.7 C) (05/03 0732) Pulse Rate:  [84-112] 90 (05/03 0800) Cardiac Rhythm:  [-]  Resp:  [12-23] 18 (05/03 0800) BP: (120-155)/(67-93) 127/84 mmHg (05/03 0800) SpO2:  [95 %-100 %] 98 % (05/03 0800) Weight:  [70.5 kg (155 lb 6.8 oz)] 70.5 kg (155 lb 6.8  oz) (05/03 0214)  No results for input(s): GLUCAP in the last 168 hours.  Recent Labs Lab 11/20/14 1001 11/30/2014 1236 12/05/2014 1535  NA 140 140 140  K 3.8 4.2 3.9  CL  --   --  106  CO2 20* 23 24  GLUCOSE 127 100 103*  BUN 14.0 20.8 24*  CREATININE 0.8 0.8 0.85  CALCIUM 9.6 9.7 9.8    Recent Labs Lab 11/20/14 1001 11/27/2014 1236 12/21/2014 1535  AST '21 24 31  '$ ALT 41 40 44  ALKPHOS 115 108 111  BILITOT 0.24 <0.20 0.2*  PROT 7.5 7.2 7.8  ALBUMIN 3.7 3.6 4.3    Recent Labs Lab 11/20/14 0959 12/18/2014 1236 12/21/2014 1535  WBC 20.5* 21.8* 19.2*  NEUTROABS 15.1* 16.1* 13.4*  HGB 11.7* 8.6* 8.8*  HCT 35.2* 26.2* 26.8*  MCV 93.6 92.0 95.4  PLT 429* 448* 474*    Recent Labs Lab 11/27/14 0045 11/27/14 0602  TROPONINI 0.32* 0.62*    Recent Labs  12/05/2014 1535  LABPROT 14.3  INR 1.10    Recent Labs  12/11/2014 1840  COLORURINE YELLOW  LABSPEC 1.024  PHURINE 6.0  GLUCOSEU NEGATIVE  HGBUR NEGATIVE  BILIRUBINUR NEGATIVE  KETONESUR NEGATIVE  PROTEINUR NEGATIVE  UROBILINOGEN 0.2  NITRITE NEGATIVE  LEUKOCYTESUR NEGATIVE       Component Value Date/Time   CHOL 132 06/22/2011 0442   TRIG 98 06/22/2011 0442   HDL 35* 06/22/2011 0442   CHOLHDL 3.8  06/22/2011 0442   VLDL 20 06/22/2011 0442   LDLCALC 77 06/22/2011 0442   No results found for: HGBA1C No results found for: LABOPIA, COCAINSCRNUR, LABBENZ, AMPHETMU, THCU, LABBARB   Recent Labs Lab 12/18/2014 [place] <5    Dg Chest 2 View 11/27/2014    1. Persistent mediastinal and left hilar adenopathy. 2. Persistent ill-defined density left upper lobe . No new lesions identified. 12/21/2014    1. Unchanged LEFT upper lobe spiculated pulmonary nodule. 2. RIGHT peritracheal and LEFT hilar adenopathy.     Ct Head Wo Contrast 12/16/2014   Acute subarachnoid hemorrhage within a sulcus at the RIGHT parietal lobe.  No other intracranial abnormalities.    [name] Contrast 11/27/2014    1. Acute ischemic cortical  infarcts involving the left frontotemporal and left parietal regions as above. No significant mass effect or associated hemorrhage. 2. Additional subcentimeter focus of restricted diffusion adjacent to the atrium of the right lateral ventricle, also suspicious for small acute ischemic infarct. 3. Stable volume and distribution of acute subarachnoid hemorrhage within the right frontal region as compared to prior CT. No new intracranial hemorrhage. 4. 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 One of these lesions lies in close proximity to the acute subarachnoid hemorrhage within the right frontal lobe, raising the suspicion that this is the source of bleeding.   [name] 11/27/2014     5. Normal MRV of the brain.   PHYSICAL EXAM  . Afebrile. Head is nontraumatic. Neck is supple without bruit.    Cardiac exam no murmur or gallop. Lungs are clear to auscultation. Distal pulses are well felt.  Neurological Exam ;  Awake  Alert oriented x 3. Normal speech and language.eye movements full without nystagmus.fundi were not visualized. Vision acuity and fields appear normal. Hearing is normal. Palatal movements are normal. Face symmetric. Tongue midline. Normal strength, tone, reflexes and coordination. Normal sensation. Gait deferred. ASSESSMENT/PLAN Adam. CADAN Parsons is a 54 y.o. male with history of HTN, hyperlipidemia, CAD s/p stenting, MI, smoking, newly diagnosed left lung cancer on 2/16, right PE who developed sudden onset of expressive aphasia. CT showed small R parietal SAH. He did not receive IV t-PA due to [facility].   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 Small R PV ischemic infarct. R frontal SAH.  Multifocal small brain mets.  Repeat CT in 2 days  Check carotid Doppler  (EPIC order not currently available)  Check TCD (EPIC order not currently available)  2D Echo  09/22/2014 No source of embolus   EEG done. Results pending   Check lipid panel, LDL pending   Check HgbA1c, pending  SCDs for VTE prophylaxis Diet NPO time specified Except for: Sips with Meds  aspirin 81 mg orally every day, clopidogrel 75 mg orally every day and full dose lovenox prior to admission, for now  no antithrombotics given hemorrhage. If CT improved after 2 days, will plan resumption of low dose aspirin. Long-term medication is complex given recent stents, cancer and ischemic stroke   Continue ICU level care x 24h  Therapy recommendations:  None anticipated.  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  Undergoing chemo treatment  Neurosurgery [facility]) consulted  Brain mets felt to be amenable to stereotactic radiosurgery  Pulmonary Embolism  Hypercoagulable from cancer  On full dose lovenox prior to admission  Given 50 protamine  IVC filter planned today  Hypertension  Home meds:   lopressor  Stable  Hyperlipidemia  Home meds:  lipitor 80, resumed in hospital  LDL pending, goal < 70  Continue statin at discharge  Other Stroke Risk Factors  Cigarette smoker, 39 year history, advised to stop smoking  Marijuana use  Coronary artery disease - MI 2006, 2012; stent 2006, 2012, 2015; PCI. Burt Knack)  Other Active Problems  Leukocytosis, no indication of infection  Elevated troponins   Hospital day # [place] for Pager information 11/27/2014 9:32 AM  I have personally examined this patient, reviewed notes, independently viewed  imaging studies, participated in medical decision making and plan of care. I have made any additions or clarifications directly to the above note. Agree with note above. He presented with TIA but MRI scan shows silent tiny right frontal convexity SAH likely related to being on dual antiplatelets and Lovenox as well as multiple small bilateral embolic strokes of unknown etiology. Hypercoagulability from cancer or it`s treatment could certainly be contributory. This is a extremely complex situation requiring medical decision making of high complexity has we have to hold antiplatelet and Lovenox due to acute SAH but doing so increases risk of coronary stent thrombosis and pulmonary embolism but we do not have any choice.Recommend repeat head CTin 2 days and if intracranial blood is isodense start aspirin 81 mg daily and after 2 weeks may start anticoagulation with Eliquis given h/o PE.D/w Dr Burt Knack cardiologist who agrees as stent is more than 6 months old.Long d/w patient and family members and explained complex difficult and  tricky situation and they agree with plan. This patient is critically ill and at significant risk of neurological worsening, death and care requires constant monitoring of vital signs, hemodynamics,respiratory and cardiac monitoring,review of multiple databases, neurological assessment, discussion with family, other specialists and medical decision making of high complexity.I have made any additions or clarifications directly to the above note.  I spent 35 minutes of neurocritical care time  in the care of  this patient.   Antony Contras, MD Medical Director [facility] Pager: [phone] 11/27/2014 10:23 PM    To contact Stroke Continuity provider, please refer to [email or web address] After hours, contact General Neurology

## 2014-11-27 NOTE — Consult Note (Signed)
Radiation Oncology         [phone] ________________________________  Established Inpatient Consultation  Name: [name]  MRN: [id number]  Date: 12/13/2014  DOB: 03-Dec-1960  XT:KWIOXBD,ZHGDJM R, MD  No ref. provider found   REFERRING PHYSICIAN: Dr. 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 male who was found to have a left hilar mass associated with left hilar, paratracheal, and aortopulmonary window adenopathy on chest CT on 09/22/14. He was referred to Dr. Melvyn Novas. A PET CT was done on 10/02/14, which showed the left hilar mass, AP window, paratracheal, subcarinal and left hilar nodes were hypermetabolic. There also was a small hypermetabolic left supraclavicular node. The left supraclavicular node was biopsied but was non diagnostic. A mediastinoscopy was then performed by Dr. Roxan Hockey on 11/05/14 and pathology showed poorly differentiated adenocarcinoma.  Of note, brain MRI on 09/22/14 showed no metastases.  The patient was evaluated in the multidisciplinary thoracic oncology clinic and set up for concurrent chemoradiation with Dr. Earlie Server and Dr. Pablo Ledger.   On 12/22/2014, the patient was to receive his first chemotherapy today at [facility], but after going to the bathroom, developed difficulty with speech. He says his symptoms lasted about 10 minutes, but when he discussed the difficulties with the staff, they did a stroke assessment, and the patient was transferred to the [facility] emergency room for further evaluation. CT of the head without contrast was obtained by Dr. Quintella Reichert.  This study showed a acute subarachnoid hemorrhage on the right side the patient was transferred to [facility] were an MRI has demonstrated at least 3 subcentimeter asymptomatic  brain metastases.  PREVIOUS RADIATION THERAPY: No  PAST MEDICAL HISTORY:  has a past medical history of HLD (hyperlipidemia); HTN (hypertension); GERD (gastroesophageal reflux disease); History of gout; Myocardial infarction (02/2005; 05/2011); Pulmonary embolism (09/21/2014); Lung mass (08/2014); and Coronary artery disease.    PAST SURGICAL HISTORY: Past Surgical History  Procedure Laterality Date  . Left heart cath N/A 06/21/2011    Procedure: LEFT HEART CATH;  Surgeon: [name], MD;  Location: [facility] CATH LAB;  Service: Cardiovascular;  Laterality: N/A;  . Left heart catheterization with coronary angiogram N/A 06/20/2014    Procedure: LEFT HEART CATHETERIZATION WITH CORONARY ANGIOGRAM;  Surgeon: Peter M Martinique, MD;  Location: [facility] CATH LAB;  Service: Cardiovascular;  Laterality: N/A;  . Coronary angioplasty with stent placement  02/2005; 05/2011; 06/20/2014    "1; ?2; 1"  . Esophagogastroduodenoscopy  2004  . Colonoscopy w/ polypectomy    . Video bronchoscopy with endobronchial ultrasound N/A 11/05/2014    Procedure: VIDEO BRONCHOSCOPY WITH ENDOBRONCHIAL ULTRASOUND;  Surgeon: [name], MD;  Location: [place];  Service: Thoracic;  Laterality: N/A;  . Mediastinoscopy N/A 11/05/2014    Procedure:  MEDIASTINOSCOPY;  Surgeon: [name], MD;  Location: [place];  Service: Thoracic;  Laterality: N/A;    FAMILY HISTORY: family history includes Hypertension in his father.  SOCIAL HISTORY:  reports that he has been smoking Cigarettes.  He has a 39 pack-year smoking history. He has never used smokeless tobacco. He reports that he uses illicit drugs (Marijuana). He reports that he does not drink alcohol.  ALLERGIES: Review of patient's allergies indicates no known allergies.  MEDICATIONS:  Current Facility-Administered Medications  Medication Dose Route Frequency Provider Last Rate Last Dose  . 0.9 %  sodium chloride infusion  250 mL Intravenous PRN Raylene Miyamoto, MD      .  0.9 %  sodium chloride infusion  250 mL Intravenous PRN Raylene Miyamoto, MD      . 0.9 %  sodium chloride infusion   Intravenous Continuous PRN Greggory Keen, MD 10 mL/hr at 11/27/14 1138 10 mL/hr at 11/27/14 1138  . atorvastatin (LIPITOR) tablet 80 mg  80 mg Oral Daily Raylene Miyamoto, MD   80 mg at 11/27/14 1632  . fentaNYL (SUBLIMAZE) 100 MCG/2ML injection           . fentaNYL (SUBLIMAZE) injection 25-75 mcg  25-75 mcg Intravenous Q2H PRN Colbert Coyer, MD   75 mcg at 11/27/14 1633  . lidocaine (XYLOCAINE) 1 % (with pres) injection           . LORazepam (ATIVAN) tablet 1 mg  1 mg Oral Q6H PRN Rahul P Desai, PA-C      . metoprolol tartrate (LOPRESSOR) tablet 12.5 mg  12.5 mg Oral BID Rahul P Desai, PA-C   12.5 mg at 11/27/14 1631  . midazolam (VERSED) 2 MG/2ML injection           . ondansetron (ZOFRAN) injection 4 mg  4 mg Intravenous Q4H PRN Rush Farmer, MD      . promethazine (PHENERGAN) injection 12.5 mg  12.5 mg Intravenous Q6H PRN Raylene Miyamoto, MD      . sodium chloride 0.9 % injection 3 mL  3 mL Intravenous Q12H Raylene Miyamoto, MD   3 mL at 12/14/2014 2224  . sodium chloride 0.9 % injection 3 mL  3 mL Intravenous PRN Raylene Miyamoto, MD        REVIEW OF SYSTEMS:  A 15 point review of systems is documented in the electronic medical record. This was obtained by the nursing staff. However, I reviewed this with the patient to discuss relevant findings and make appropriate changes.  Pertinent items are noted in HPI.   PHYSICAL EXAM:  height is '5\' 9"'$  (1.753 m) and weight is 155 lb 6.8 oz (70.5 kg). His oral temperature is 98.5 F (36.9 C). His blood pressure is 145/74 and his pulse is 84. His respiration is 12 and oxygen saturation is 97%.   Per neurosurgery:Well-developed, well-nourished, white male in no acute distress. Blood pressure 145/80, pulse 91, temperature 98.3 F (36.8 C), temperature source Oral, resp. rate 13, SpO2 98 %. Lungs:  Clear to auscultation,  symmetrical respiratory excursion. Heart:  Normal S1 and S2, no murmur. Abdomen:  Soft, nondistended, bowel sounds present.  Neurological Examination: Mental Status Examination:  Awake and alert, oriented to his name, [name], [place], May 2016. Speech is fluent. He has good comprehension. Cranial Nerve Examination:  Pupils 3 mm, round, reactive to light. EOMI. Facial sensation intact. Facial movements symmetrical. Hearing present bilaterally. Palatal movements symmetrical. Shoulder shrug symmetrical. Tongue midline. Motor Examination:  5/5 strength in the upper and lower extremities. No drift of the upper extremities. Sensory Examination:  Intact to pinprick to the upper and lower extremities. Reflex Examination:   1+ in the upper extremities, 1-2+ in the lower extremities. Toes are downgoing bilaterally.  KPS = 80  100 - Normal; no complaints; no evidence of disease. 90   - Able to carry on normal activity; minor signs or symptoms of disease. 80   - Normal activity with  effort; some signs or symptoms of disease. 45   - Cares for self; unable to carry on normal activity or to do active work. 60   - Requires occasional assistance, but is able to care for most of his personal needs. 50   - Requires considerable assistance and frequent medical care. 69   - Disabled; requires special care and assistance. 39   - Severely disabled; hospital admission is indicated although death not imminent. 9   - Very sick; hospital admission necessary; active supportive treatment necessary. 10   - Moribund; fatal processes progressing rapidly. 0     - Dead  Karnofsky DA, [facility], Craver LS and [facility] [phone]) The use of the nitrogen mustards in the palliative treatment of carcinoma: with particular reference to bronchogenic carcinoma Cancer 1 [id number]  LABORATORY DATA:  Lab Results  Component Value Date   WBC 16.1* 11/27/2014   HGB 7.8* 11/27/2014   HCT 23.5* 11/27/2014     MCV 92.2 11/27/2014   PLT 382 11/27/2014   Lab Results  Component Value Date   NA 135 11/27/2014   K 3.7 11/27/2014   CL 102 11/27/2014   CO2 22 11/27/2014   Lab Results  Component Value Date   ALT 44 12/06/2014   AST 31 12/13/2014   ALKPHOS 111 12/05/2014   BILITOT 0.2* 12/18/2014     RADIOGRAPHY: Dg Chest 2 View  12/06/2014   CLINICAL DATA:  Numbness. Dysphagia. LEFT-sided numbness. History of cardiac problems.  EXAM: CHEST  2 VIEW  COMPARISON:  None.  FINDINGS: Spiculated LEFT upper lobe mass appears similar to PET-CT 09/21/2014. Fullness of the RIGHT peritracheal soft tissues most compatible with lymphadenopathy. This is a new finding compared to 09/21/2014. There is no airspace disease. No pleural effusion. Cardiac stent noted. Monitoring leads project over the chest. Cardiopericardial silhouette within normal limits.  IMPRESSION: 1. Unchanged LEFT upper lobe spiculated pulmonary nodule. 2. RIGHT peritracheal and LEFT hilar adenopathy.   Electronically Signed   By: Dereck Ligas M.D.   On: 11/28/2014 15:50   Dg Chest 2 View  11/23/2014   CLINICAL DATA:  Cough, chest pains, shortness of breath, and elevated white blood cell count  EXAM: CHEST  2 VIEW  COMPARISON:  Chest x-ray of November 12, 2014  FINDINGS: The lungs are well-expanded. The left suprahilar masslike lesion is unchanged. There is persistent right paratracheal lymphadenopathy. Subtle increased density in the left infrahilar region has developed. There is no pleural effusion. The heart and pulmonary vascularity are normal. The trachea is midline. The bony thorax is unremarkable.  IMPRESSION: 1. Persistent left upper lobe parenchymal masslike density. There is persistent right paratracheal soft tissue fullness consistent with lymphadenopathy. 2. Subtle new increased density in the left infrahilar region may reflect developing pneumonia in the superior aspect of the left lower lobe. 3. These results will be called to the ordering  clinician or representative by the Radiologist Assistant, and communication documented in the PACS or zVision Dashboard.   Electronically Signed   By: David  Martinique M.D.   On: 11/23/2014 15:31   Dg Chest 2 View  11/12/2014   CLINICAL DATA:  Dyspnea x 3-4 days after bronchoscopy 11/05/14, mediastinoscopy 11/05/14, diagnosed with lung cancer 09/21/14, patient states when he falls asleep he gets sob and it wakes him up , there is also sob on exertion, ? Pneumothorax, patient has a large knot at the sternal notch, CAD, HTN, hx of embolism  EXAM: CHEST - 2 VIEW  COMPARISON:  11/05/2014 and earlier studies  FINDINGS: Spiculated left suprahilar lesion. Lobular right paratracheal fullness has progressed specially compared to previous radiograph 09/21/2014. Right lung remains clear. Heart size normal. Atheromatous aorta. Coronary calcifications. No pneumothorax. No effusion. Visualized skeletal structures are unremarkable.  IMPRESSION: 1. Persistent left suprahilar lesion with progressive right paratracheal adenopathy.   Electronically Signed   By: Lucrezia Europe M.D.   On: 11/12/2014 16:00   Dg Chest 2 View  11/05/2014   CLINICAL DATA:  Preop, mass in the left upper lobe with adenopathy  EXAM: CHEST  2 VIEW  COMPARISON:  09/21/2014  FINDINGS: Stable appearance of the left suprahilar mass with left hilar and right peritracheal enlargement. Normal heart size and aortic contours. Bilateral coronary stents are noted. There is no edema, consolidation, effusion, or pneumothorax.  IMPRESSION: 1. No active cardiopulmonary disease. 2. Grossly stable left hilar mass.   Electronically Signed   By: Monte Fantasia M.D.   On: 11/05/2014 06:06   Ct Head Wo Contrast  12/20/2014   CLINICAL DATA:  LEFT side facial numbness beginning last night, recently diagnosed with lung cancer, planned chemotherapy to start today but was to week, past history of coronary artery disease post MI, hypertension, smoker  EXAM: CT HEAD WITHOUT CONTRAST   TECHNIQUE: Contiguous axial images were obtained from the base of the skull through the vertex without intravenous contrast.  COMPARISON:  None; correlation MRI brain 09/22/2014  FINDINGS: Normal ventricular morphology.  No midline shift or mass effect.  High attenuation subarachnoid hemorrhage identified within a sulcus in the RIGHT parietal lobe.  No intraparenchymal hemorrhage, definite mass lesion, or evidence acute infarction.  No additional extra-axial collections.  Atherosclerotic calcifications at the carotid siphons.  Bones unremarkable.  RIGHT ethmoid air cell opacification noted.  IMPRESSION: Acute subarachnoid hemorrhage within a sulcus at the RIGHT parietal lobe.  No other intracranial abnormalities.  Critical Value/emergent results were called by telephone at the time of interpretation on 11/27/2014 at 1722 hr to Dr. Quintella Reichert , who verbally acknowledged these results.   Electronically Signed   By: Lavonia Dana M.D.   On: 12/12/2014 17:23   Mr Jeri Cos SA Contrast  11/27/2014   CLINICAL DATA:  49 or old male with history of newly diagnosed left lung cancer, presented with sudden onset expressive aphasia, found have acute subarachnoid hemorrhage.  EXAM: MRI HEAD WITHOUT AND WITH CONTRAST  MRV HEAD WITHOUT CONTRAST  TECHNIQUE: Multiplanar, multiecho pulse sequences of the brain and surrounding structures were obtained without and with intravenous contrast. Angiographic images of the intracranial venous structures were obtained using MRV technique without intravenous contrast.  Multiplanar multi sequence images of the brain were performed with and without the administration of IV gadolinium. Additional dedicated MRV images were obtained.  CONTRAST:  19m MULTIHANCE GADOBENATE DIMEGLUMINE 529 MG/ML IV SOLN  COMPARISON:  Prior CT from 11/28/2014 as well as MRI from 09/22/2014.  FINDINGS: Cerebral volume within normal limits for patient age. Minimal patchy T2/FLAIR hyperintensity present within the  periventricular and deep white matter both cerebral hemispheres, likely related to very mild chronic small vessel ischemic changes.  Serpiginous hyperintense FLAIR signal intensity seen layering within cortical sulci within the posterior right frontal lobe is consistent with acute subarachnoid hemorrhage. Overall, this is grossly stable in volume and distribution as compared to previous CT. No new hemorrhage identified. There is associated restricted diffusion with this subarachnoid hemorrhage.  There is abnormal restricted diffusion involving the cortical gray matter of the left frontotemporal region, consistent  with acute ischemic infarct (series 3, image 21). This involves the posterior left insular cortex. No associated hemorrhage or significant mass effect. Additional small cortical infarct more posteriorly within the posterior left frontal region (series 3, image 23). Additional tiny cortical infarct more superiorly within knee postcentral gyrus of the left parietal lobe (series 3, image 37, 34).  Tiny focus of restricted diffusion adjacent to the atrium of the right lateral ventricle noted as well (series 3, image 24). Finding also may reflect a small acute ischemic infarct.  On post-contrast sequences, there is a tiny ring enhancing 4 mm lesion in the region of the right insula, compatible with a small intracranial metastasis (series 11, image 28). Additional 3 mm metastasis present within the cortical gray matter of the posterior left frontal parietal region (series 11, image 29). There is a tiny 2 mm metastasis within the left cerebellar hemisphere (series 11, image 9). Additional tiny 2 mm mass seen more superiorly within the left cerebellar hemisphere (series 11, image 13). No other definite intracranial metastases seen on post-contrast sequences. There are is a round 3 mm focus of restricted diffusion within mean right frontal lobe, seen on series 3, image 32, which may reflect a small/occult metastasis  as well, as no definite subarachnoid hemorrhage or infarct is seen associated with this lesion. There is an additional 3 mm metastasis within the peripheral right frontal lobe (series 12, image 17). This is seen in the region of the acute subarachnoid hemorrhage.  No midline shift or significant mass effect. No hydrocephalus. No extra-axial fluid collection.  Craniocervical junction within normal limits. Pituitary gland normal.  No acute abnormality about the orbits.  Scattered opacity present within the ethmoidal air cells, right greater than left. Small amount of mucosal thickening present within level left maxillary sinus. No mastoid effusion. Inner ear structures are normal. Normal intravascular flow voids are maintained.  Bone marrow signal intensity within normal limits. No scalp soft tissue abnormality.  Dedicated MRV images demonstrate no filling defect to suggest venous sinus thrombosis. Superior sagittal sinus, transverse sinuses, and sigmoid sinuses are widely patent. Visualize internal jugular veins are widely patent. Deep venous structures patent.  IMPRESSION: 1. 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 No significant mass effect or associated hemorrhage. 2. Additional subcentimeter focus of restricted diffusion adjacent to the atrium of the right lateral ventricle, also suspicious for small acute ischemic infarct. 3. Stable volume and distribution of acute subarachnoid hemorrhage within the right frontal region as compared to prior CT. No new intracranial hemorrhage. 4. 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 One of these lesions lies in close proximity to the acute subarachnoid hemorrhage within the right frontal lobe, raising the suspicion that this is the source of bleeding. 5. Normal MRV of the brain.    Electronically Signed   By: Jeannine Boga M.D.   On: 11/27/2014 06:38   Mr [name]  11/27/2014   CLINICAL DATA:  39 or old male with history of newly diagnosed left lung cancer, presented with sudden onset expressive aphasia, found have acute subarachnoid hemorrhage.  EXAM: MRI HEAD WITHOUT AND WITH CONTRAST  MRV HEAD WITHOUT CONTRAST  TECHNIQUE: Multiplanar, multiecho pulse sequences of the brain and surrounding structures were obtained without and with intravenous contrast. Angiographic images of the intracranial venous structures were obtained using MRV technique without intravenous contrast.  Multiplanar multi sequence images of the brain were performed with and without the administration of IV gadolinium. Additional dedicated MRV images were obtained.  CONTRAST:  69m MULTIHANCE GADOBENATE DIMEGLUMINE 529 MG/ML IV SOLN  COMPARISON:  Prior CT from 12/08/2014 as well as MRI from 09/22/2014.  FINDINGS: Cerebral volume within normal limits for patient age. Minimal patchy T2/FLAIR hyperintensity present within the periventricular and deep white matter both cerebral hemispheres, likely related to very mild chronic small vessel ischemic changes.  Serpiginous hyperintense FLAIR signal intensity seen layering within cortical sulci within the posterior right frontal lobe is consistent with acute subarachnoid hemorrhage. Overall, this is grossly stable in volume and distribution as compared to previous CT. No new hemorrhage identified. There is associated restricted diffusion with this subarachnoid hemorrhage.  There is abnormal restricted diffusion involving the cortical gray matter of the left frontotemporal region, consistent with acute ischemic infarct (series 3, image 21). This involves the posterior left insular cortex. No associated hemorrhage or significant mass effect. Additional small cortical infarct more posteriorly within the posterior left frontal region (series 3, image 23). Additional  tiny cortical infarct more superiorly within knee postcentral gyrus of the left parietal lobe (series 3, image 37, 34).  Tiny focus of restricted diffusion adjacent to the atrium of the right lateral ventricle noted as well (series 3, image 24). Finding also may reflect a small acute ischemic infarct.  On post-contrast sequences, there is a tiny ring enhancing 4 mm lesion in the region of the right insula, compatible with a small intracranial metastasis (series 11, image 28). Additional 3 mm metastasis present within the cortical gray matter of the posterior left frontal parietal region (series 11, image 29). There is a tiny 2 mm metastasis within the left cerebellar hemisphere (series 11, image 9). Additional tiny 2 mm mass seen more superiorly within the left cerebellar hemisphere (series 11, image 13). No other definite intracranial metastases seen on post-contrast sequences. There are is a round 3 mm focus of restricted diffusion within mean right frontal lobe, seen on series 3, image 32, which may reflect a small/occult metastasis as well, as no definite subarachnoid hemorrhage or infarct is seen associated with this lesion. There is an additional 3 mm metastasis within the peripheral right frontal lobe (series 12, image 17). This is seen in the region of the acute subarachnoid hemorrhage.  No midline shift or significant mass effect. No hydrocephalus. No extra-axial fluid collection.  Craniocervical junction within normal limits. Pituitary gland normal.  No acute abnormality about the orbits.  Scattered opacity present within the ethmoidal air cells, right greater than left. Small amount of mucosal thickening present within level left maxillary sinus. No mastoid effusion. Inner ear structures are normal. Normal intravascular flow voids are maintained.  Bone marrow signal intensity within normal limits. No scalp soft tissue abnormality.  Dedicated MRV images demonstrate no filling defect to suggest venous sinus  thrombosis. Superior sagittal sinus, transverse sinuses, and sigmoid sinuses are widely patent. Visualize internal jugular veins are widely patent. Deep venous structures patent.  IMPRESSION: 1. 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 No significant mass effect or associated hemorrhage. 2. Additional subcentimeter focus of restricted diffusion adjacent to the atrium of the right lateral ventricle, also suspicious for small acute ischemic infarct. 3. Stable volume and distribution of acute subarachnoid hemorrhage within the right frontal region as compared to prior CT. No new intracranial hemorrhage. 4. Multi focal small approximately 3 mm enhancing lesions within  the brain as detailed above, most consistent with intracranial metastases given the history of lung cancer. One of these lesions lies in close proximity to the acute subarachnoid hemorrhage within the right frontal lobe, raising the suspicion that this is the source of bleeding. 5. Normal MRV of the brain.   Electronically Signed   By: Jeannine Boga M.D.   On: 11/27/2014 06:38   Ir Ivc Filter Plmt / S&i /img Guid/mod Sed  11/27/2014   CLINICAL DATA:  Pulmonary embolus, subarachnoid hemorrhage, patient can no longer be anticoagulated  EXAM: ULTRASOUND GUIDANCE FOR VASCULAR ACCESS  IVC CATHETERIZATION AND VENOGRAM  IVC FILTER INSERTION  Date:  5/3/20165/09/2014 12:12 pm  Radiologist:  M. Daryll Brod, MD  Guidance:  Ultrasound and fluoroscopic  FLUOROSCOPY TIME:  1 minutes 6 seconds, 25.55 mGy  MEDICATIONS AND MEDICAL HISTORY: 1 mg Versed, 50 mcg fentanyl  ANESTHESIA/SEDATION: 10 minutes  CONTRAST:  53m OMNIPAQUE IOHEXOL 300 MG/ML  SOLN  COMPLICATIONS: None immediate  PROCEDURE: Informed consent was obtained from the patient following explanation of the procedure, risks, benefits and alternatives. The patient understands, agrees and consents for the procedure. All questions were  addressed. A time out was performed.  Maximal barrier sterile technique utilized including caps, mask, sterile gowns, sterile gloves, large sterile drape, hand hygiene, and betadine prep.  Under sterile condition and local anesthesia, right internal jugular venous access was performed with ultrasound. Over a guide wire, the IVC filter delivery sheath and inner dilator were advanced into the IVC just above the IVC bifurcation. Contrast injection was performed for an IVC venogram.  IVC VENOGRAM: The IVC is patent. No evidence of thrombus, stenosis, or occlusion. No variant venous anatomy. The renal veins are identified at L1-2.  IVC FILTER INSERTION: Through the delivery sheath, the Bard Denali IVC filter was deployed in the infrarenal IVC at the L2-3 level just below the renal veins and above the IVC bifurcation. Contrast injection confirmed position. There is good apposition of the filter against the IVC.  The delivery sheath was removed and hemostasis was obtained with compression for 5 minutes. The patient tolerated the procedure well. No immediate complications.  IMPRESSION: Ultrasound and fluoroscopically guided infrarenal IVC filter insertion.   Electronically Signed   By: MJerilynn Mages  Shick M.D.   On: 11/27/2014 13:00   Dg Chest Port 1 View  11/27/2014   CLINICAL DATA:  Hypertension.  Evaluate for metastatic disease.  EXAM: PORTABLE CHEST - 1 VIEW  COMPARISON:  12/18/2014.  PET-CT 10/02/2014.  CT chest 09/21/2014.  FINDINGS: Stable changes of mediastinal and left hilar adenopathy. Persistent ill-defined density left upper lobe. No pleural effusion or pneumothorax. Heart size stable. No pneumothorax. No acute osseus abnormality.  IMPRESSION: 1. Persistent mediastinal and left hilar adenopathy. 2. Persistent ill-defined density left upper lobe . No new lesions identified.   Electronically Signed   By: TMarcello Moores Register   On: 11/27/2014 07:20      IMPRESSION: This patient is a very nice 54year old gentleman recently  diagnosed with adenocarcinoma of the left lung who now presents with 3 asymptomatic subcentimeter brain metastases.  At this point, the patient would potentially benefit from radiotherapy. The options include whole brain irradiation versus stereotactic radiosurgery. There are pros and cons associated with each of these potential treatment options. Whole brain radiotherapy would treat the known metastatic deposits and help provide some reduction of risk for future brain metastases. However, whole brain radiotherapy carries potential risks including hair loss, subacute somnolence, and neurocognitive changes including a  possible reduction in short-term memory.  Stereotactic radiosurgery carries a higher likelihood for local tumor control at the targeted sites with lower associated risk for neurocognitive changes such as memory loss. However, the use of stereotactic radiosurgery in this setting may leave the patient at increased risk for new brain metastases elsewhere in the brain as high as 50-60%. Accordingly, patients who receive stereotactic radiosurgery in this setting should undergo ongoing surveillance imaging with brain MRI more frequently in order to identify and treat new small brain metastases before they become symptomatic. Stereotactic radiosurgery does carry some different risks, including a higher risk of radionecrosis, although this is a small risk with smaller metastases.  PLAN: Today, I reviewed the findings and workup thus far with the patient. We discussed the decision regarding whole brain radiotherapy versus stereotactic radiosurgery. We discussed the pros and cons of each. We also discussed the logistics and delivery of each. We reviewed the results associated with each of the treatments described above. The patient seems to understand the treatment options and would like to proceed with stereotactic radiosurgery.  I spent 20 minutes minutes face to face with the patient and more than 50% of  that time was spent in counseling and/or coordination of care.    Prior to proceeding, he will benefit from 3T MRI with 1 mm axial slices in SRS protocol.  Imaging and treatment can potentially be delayed until after the patient recovers from his likely unrelated right subarachnoid hemorrhage.  Brain SRS will not likely require any delay in his chemotherapy or thoracic radiotherapy, and can be performed without interrupting those treatments.   ------------------------------------------------  Sheral Apley Tammi Klippel, M.D.

## 2014-11-27 NOTE — Consult Note (Signed)
Referring Physician: Dr. Titus Mould    Chief Complaint: dysarthria, SAH on CT brain  HPI:                                                                                                                                         [name] is an 54 y.o. male with a past medical history significant for HTN, hyperlipidemia, CAD s/p stenting, MI, smoking, newly diagnosed left lung cancer on 2/16, right PE, transferred to [facility] for further evaluation and management of right sulcal SAH. Patient indicated that he was at the [place] center yesterday, about to initiate his chemotherapy,  but after going to the bathroom he developed sudden onset of difficulty expressing himself that he describes as " knowing what I wanted to say but couldn't get anything out". The episode lasted for at least 10 minutes and he recalls that he also had some numbness-tingling around the left side of his mouth that then travelled to the left hand. No associated HA, vertigo, double vision, confusion, focal weakness, or vision impairment. Of note, he tells me that the day before he had " numbness and tingling of the left lower face and left arm lasting for 10 or 15 minutes". Patient was sent to [facility] where he had a CT brain that I reviewed myself and demonstrated a small acute subarachnoid hemorrhage within a sulcus at the RIGHT parietal lobe. Mr. Say has been on a combination of aspirin and plavix since previous MI and Lovenox 100 mg was added this past February for PE treatment.  Date last known well: 11/25/2014 Time last known well: 2 pm tPA Given: no, SAH,    Past Medical History  Diagnosis Date  . HLD (hyperlipidemia)   . HTN (hypertension)     "took me off my BP pills" (06/20/2014)  . GERD (gastroesophageal reflux disease)     hx (06/20/2014)  . History of gout   . Myocardial infarction 02/2005; 05/2011  . Pulmonary embolism 09/21/2014    on Lovenox  . Lung mass 08/2014  . Coronary artery disease     a. s/p  Taxus DES to CFX 8/06 in setting of NSTEMI;  b.  s/p Inf STEMI 06/21/11:  mRCA occluded>> PCI:  Overlapping Promus DES x 2 to RCA.;  c.  LHC (11/15):  pLAD 20-30%, oDx branches 50-60%, mCFX stent 95% ISR, dCFX stent ok, mRCA stent occluded with L-R collats, EF 50-55%, basal inf AK, mid Inf HK >> PCI:  3.25 x 23 mm Xience DES to [facility]    Past Surgical History  Procedure Laterality Date  . Left heart cath N/A 06/21/2011    Procedure: LEFT HEART CATH;  Surgeon: [name], MD;  Location: [facility] CATH LAB;  Service: Cardiovascular;  Laterality: N/A;  . Left heart catheterization with coronary angiogram N/A 06/20/2014    Procedure: LEFT HEART CATHETERIZATION WITH CORONARY ANGIOGRAM;  Surgeon:  Peter M Martinique, MD;  Location: [facility] CATH LAB;  Service: Cardiovascular;  Laterality: N/A;  . Coronary angioplasty with stent placement  02/2005; 05/2011; 06/20/2014    "1; ?2; 1"  . Esophagogastroduodenoscopy  2004  . Colonoscopy w/ polypectomy    . Video bronchoscopy with endobronchial ultrasound N/A 11/05/2014    Procedure: VIDEO BRONCHOSCOPY WITH ENDOBRONCHIAL ULTRASOUND;  Surgeon: [name], MD;  Location: [facility];  Service: Thoracic;  Laterality: N/A;  . Mediastinoscopy N/A 11/05/2014    Procedure:  MEDIASTINOSCOPY;  Surgeon: [name], MD;  Location: [facility] OR;  Service: Thoracic;  Laterality: N/A;    Family History  Problem Relation Age of Onset  . Hypertension Father    Social History:  reports that he has been smoking Cigarettes.  He has a 39 pack-year smoking history. He has never used smokeless tobacco. He reports that he uses illicit drugs (Marijuana). He reports that he does not drink alcohol.  Allergies: No Known Allergies  Medications:                                                                                                                           Scheduled: . atorvastatin  80 mg Oral Daily  . pantoprazole (PROTONIX) IV  40 mg Intravenous QHS  . sodium chloride  3 mL  Intravenous Q12H    ROS:                                                                                                                                       History obtained from the patient and chart review  General ROS: negative for - chills, fatigue, fever, night sweats, or weight gain Psychological ROS: negative for - behavioral disorder, hallucinations, memory difficulties, mood swings or suicidal ideation Ophthalmic ROS: negative for - blurry vision, double vision, eye pain or loss of vision ENT ROS: negative for - epistaxis, nasal discharge, oral lesions, sore throat, tinnitus or vertigo Allergy and Immunology ROS: negative for - hives or itchy/watery eyes Hematological and Lymphatic ROS: negative for - bleeding problems, bruising or swollen lymph nodes Endocrine ROS: negative for - galactorrhea, hair pattern changes, polydipsia/polyuria or temperature intolerance Respiratory ROS: negative for - cough, hemoptysis Cardiovascular ROS: negative for - chest pain, edema or irregular heartbeat Gastrointestinal ROS: negative for - abdominal pain, diarrhea, hematemesis, nausea/vomiting or stool incontinence Genito-Urinary ROS: negative for - dysuria,  hematuria, incontinence or urinary frequency/urgency Musculoskeletal ROS: negative for - joint swelling or muscular weakness Neurological ROS: as noted in HPI Dermatological ROS: negative for rash and skin lesion changes  Physical exam: pleasant male in no apparent distress. Blood pressure 147/83, pulse 106, temperature 98.6 F (37 C), temperature source Oral, resp. rate 16, height $RemoveBe'5\' 9"'scnRnlLDo$  (1.753 m), weight 70.5 kg (155 lb 6.8 oz), SpO2 96 %. Head: normocephalic. Neck: supple, no bruits, no JVD. Cardiac: no murmurs. Lungs: clear. Abdomen: soft, no tender, no mass. Extremities: no edema. Skin: no rash Neurologic Examination:                                                                                                      General: Mental  Status: Alert, oriented, thought content appropriate.  Speech fluent without evidence of aphasia.  Able to follow 3 step commands without difficulty. Cranial Nerves: II: Discs flat bilaterally; Visual fields grossly normal, pupils equal, round, reactive to light and accommodation III,IV, VI: ptosis not present, extra-ocular motions intact bilaterally V,VII: smile symmetric, facial light touch sensation normal bilaterally VIII: hearing normal bilaterally IX,X: uvula rises symmetrically XI: bilateral shoulder shrug XII: midline tongue extension without atrophy or fasciculations  Motor: Right : Upper extremity   5/5    Left:     Upper extremity   5/5  Lower extremity   5/5     Lower extremity   5/5 Tone and bulk:normal tone throughout; no atrophy noted Sensory: Pinprick and light touch intact throughout, bilaterally Deep Tendon Reflexes:  Right: Upper Extremity   Left: Upper extremity   biceps (C-5 to C-6) 2/4   biceps (C-5 to C-6) 2/4 tricep (C7) 2/4    triceps (C7) 2/4 Brachioradialis (C6) 2/4  Brachioradialis (C6) 2/4  Lower Extremity Lower Extremity  quadriceps (L-2 to L-4) 2/4   quadriceps (L-2 to L-4) 2/4 Achilles (S1) 2/4   Achilles (S1) 2/4  Plantars: Right: downgoing   Left: downgoing Cerebellar: normal finger-to-nose,  normal heel-to-shin test Gait:  No tested due to multiple leads    Results for orders placed or performed during the hospital encounter of 12/04/2014 (from the past 48 hour(s))  Protime-INR     Status: None   Collection Time: 11/25/2014  3:35 PM  Result Value Ref Range   Prothrombin Time 14.3 11.6 - 15.2 seconds   INR 1.10 0.00 - 1.49  APTT     Status: None   Collection Time: 11/25/2014  3:35 PM  Result Value Ref Range   aPTT 30 24 - 37 seconds  CBC     Status: Abnormal   Collection Time: 11/28/2014  3:35 PM  Result Value Ref Range   WBC 19.2 (H) 4.0 - 10.5 K/uL   RBC 2.81 (L) 4.22 - 5.81 MIL/uL   Hemoglobin 8.8 (L) 13.0 - 17.0 g/dL   HCT 26.8 (L)  39.0 - 52.0 %   MCV 95.4 78.0 - 100.0 fL   MCH 31.3 26.0 - 34.0 pg   MCHC 32.8 30.0 - 36.0 g/dL   RDW 13.5 11.5 - 15.5 %  Platelets 474 (H) 150 - 400 K/uL  Differential     Status: Abnormal   Collection Time: 11/25/2014  3:35 PM  Result Value Ref Range   Neutrophils Relative % 70 43 - 77 %   Lymphocytes Relative 18 12 - 46 %   Monocytes Relative 8 3 - 12 %   Eosinophils Relative 3 0 - 5 %   Basophils Relative 1 0 - 1 %   Neutro Abs 13.4 (H) 1.7 - 7.7 K/uL   Lymphs Abs 3.5 0.7 - 4.0 K/uL   Monocytes Absolute 1.5 (H) 0.1 - 1.0 K/uL   Eosinophils Absolute 0.6 0.0 - 0.7 K/uL   Basophils Absolute 0.2 (H) 0.0 - 0.1 K/uL   RBC Morphology POLYCHROMASIA PRESENT   Comprehensive metabolic panel     Status: Abnormal   Collection Time: 12/09/2014  3:35 PM  Result Value Ref Range   Sodium 140 135 - 145 mmol/L   Potassium 3.9 3.5 - 5.1 mmol/L   Chloride 106 101 - 111 mmol/L   CO2 24 22 - 32 mmol/L   Glucose, Bld 103 (H) 70 - 99 mg/dL   BUN 24 (H) 6 - 20 mg/dL   Creatinine, Ser 0.85 0.61 - 1.24 mg/dL   Calcium 9.8 8.9 - 10.3 mg/dL   Total Protein 7.8 6.5 - 8.1 g/dL   Albumin 4.3 3.5 - 5.0 g/dL   AST 31 15 - 41 U/L   ALT 44 17 - 63 U/L   Alkaline Phosphatase 111 38 - 126 U/L   Total Bilirubin 0.2 (L) 0.3 - 1.2 mg/dL   GFR calc non Af Amer >60 >60 mL/min   GFR calc Af Amer >60 >60 mL/min    Comment: (NOTE) The eGFR has been calculated using the CKD EPI equation. This calculation has not been validated in all clinical situations. eGFR's persistently <90 mL/min signify possible Chronic Kidney Disease.    Anion gap 10 5 - 15  Ethanol     Status: None   Collection Time: 12/13/2014  3:37 PM  Result Value Ref Range   Alcohol, Ethyl (B) <5 <5 mg/dL    Comment:        LOWEST DETECTABLE LIMIT FOR SERUM ALCOHOL IS 11 mg/dL FOR MEDICAL PURPOSES ONLY   I-stat troponin, ED (not at [facility])     Status: Abnormal   Collection Time: 12/10/2014  3:41 PM  Result Value Ref Range   Troponin i, poc 0.19  (HH) 0.00 - 0.08 ng/mL   Comment NOTIFIED PHYSICIAN    Comment 3            Comment: Due to the release kinetics of cTnI, a negative result within the first hours of the onset of symptoms does not rule out myocardial infarction with certainty. If myocardial infarction is still suspected, repeat the test at appropriate intervals.   Urinalysis, Routine w reflex microscopic     Status: None   Collection Time: 12/02/2014  6:40 PM  Result Value Ref Range   Color, Urine YELLOW YELLOW   APPearance CLEAR CLEAR   Specific Gravity, Urine 1.024 1.005 - 1.030   pH 6.0 5.0 - 8.0   Glucose, UA NEGATIVE NEGATIVE mg/dL   Hgb urine dipstick NEGATIVE NEGATIVE   Bilirubin Urine NEGATIVE NEGATIVE   Ketones, ur NEGATIVE NEGATIVE mg/dL   Protein, ur NEGATIVE NEGATIVE mg/dL   Urobilinogen, UA 0.2 0.0 - 1.0 mg/dL   Nitrite NEGATIVE NEGATIVE   Leukocytes, UA NEGATIVE NEGATIVE    Comment: MICROSCOPIC  NOT DONE ON URINES WITH NEGATIVE PROTEIN, BLOOD, LEUKOCYTES, NITRITE, OR GLUCOSE <1000 mg/dL.  MRSA PCR Screening     Status: None   Collection Time: 12/10/2014  9:49 PM  Result Value Ref Range   MRSA by PCR NEGATIVE NEGATIVE    Comment:        The GeneXpert MRSA Assay (FDA approved for NASAL specimens only), is one component of a comprehensive MRSA colonization surveillance program. It is not intended to diagnose MRSA infection nor to guide or monitor treatment for MRSA infections.    Dg Chest 2 View  11/25/2014   CLINICAL DATA:  Numbness. Dysphagia. LEFT-sided numbness. History of cardiac problems.  EXAM: CHEST  2 VIEW  COMPARISON:  None.  FINDINGS: Spiculated LEFT upper lobe mass appears similar to PET-CT 09/21/2014. Fullness of the RIGHT peritracheal soft tissues most compatible with lymphadenopathy. This is a new finding compared to 09/21/2014. There is no airspace disease. No pleural effusion. Cardiac stent noted. Monitoring leads project over the chest. Cardiopericardial silhouette within normal  limits.  IMPRESSION: 1. Unchanged LEFT upper lobe spiculated pulmonary nodule. 2. RIGHT peritracheal and LEFT hilar adenopathy.   Electronically Signed   By: Dereck Ligas M.D.   On: 11/25/2014 15:50   Ct Head Wo Contrast  11/30/2014   CLINICAL DATA:  LEFT side facial numbness beginning last night, recently diagnosed with lung cancer, planned chemotherapy to start today but was to week, past history of coronary artery disease post MI, hypertension, smoker  EXAM: CT HEAD WITHOUT CONTRAST  TECHNIQUE: Contiguous axial images were obtained from the base of the skull through the vertex without intravenous contrast.  COMPARISON:  None; correlation MRI brain 09/22/2014  FINDINGS: Normal ventricular morphology.  No midline shift or mass effect.  High attenuation subarachnoid hemorrhage identified within a sulcus in the RIGHT parietal lobe.  No intraparenchymal hemorrhage, definite mass lesion, or evidence acute infarction.  No additional extra-axial collections.  Atherosclerotic calcifications at the carotid siphons.  Bones unremarkable.  RIGHT ethmoid air cell opacification noted.  IMPRESSION: Acute subarachnoid hemorrhage within a sulcus at the RIGHT parietal lobe.  No other intracranial abnormalities.  Critical Value/emergent results were called by telephone at the time of interpretation on 12/09/2014 at 1722 hr to Dr. Quintella Reichert , who verbally acknowledged these results.   Electronically Signed   By: Lavonia Dana M.D.   On: 12/25/2014 17:23    Assessment: 54 y.o. male with HTN, hyperlipidemia, CAD s/p stenting, MI, smoking, newly diagnosed left lung cancer on 2/16, right PE, transferred to [facility] for further evaluation and management of right sulcal non traumatic and non aneurysmal SAH, likely secondary to ASA+PLAVIX+LOVENOX, but other etiologies such as cortical vein thrombosis/hemorrhage in the setting of hypercoagulable state must be ruled out. Will require MRI/MRV for better characterization of CT  finding. Stop antiplatelets and Lovenox. Scheduled for ICV filter placement. Stroke team will follow up tomorrow.   Dorian Pod, MD [facility] [phone]  11/27/2014, 12:10 AM

## 2014-11-27 NOTE — Care Management Note (Signed)
Case Management Note  Patient Details  Name: MATTHAN SLEDGE MRN: [id number] Date of Birth: 09-07-1960   Expected Discharge Date:  2014/12/23               Expected Discharge Plan:  [place] vs SNF vs [facility] depending on therapy evals and recommendations   Angelina Sheriff, RN 11/27/2014, 2:30 PM [phone]

## 2014-11-27 NOTE — Progress Notes (Signed)
Initial Nutrition Assessment  DOCUMENTATION CODES:  Not applicable  INTERVENTION:   (Advance diet as medically appropriate, RD to add interventions accordingly)  NUTRITION DIAGNOSIS:  Inadequate oral intake related to inability to eat as evidenced by NPO status.  GOAL:  Patient will meet greater than or equal to 90% of their needs  MONITOR:  Diet advancement, PO intake, Labs, Weight trends, I & O's  REASON FOR ASSESSMENT:  Malnutrition Screening Tool    ASSESSMENT: 54 y.o. M with known PE (on lovenox) and recently diagnosed lung CA , taken to [facility] ED 12/24/2014 for speech difficulties while at cancer center for initiation of chemo. Found to have small right SAH. Transferred to [facility] for further evaluation.  Pt s/p EEG & IVC filter insertion this AM.  RD unable to obtain nutrition hx at this time.  Pt actively vomiting.  Per Malnutrition Screening Tool Report, pt has experienced recent weight loss and has been eating poorly because of a decreased appetite.  Currently NPO.  RD to re-assess at later date, continue to monitor.  Height:  Ht Readings from Last 1 Encounters:  11/28/2014 '5\' 9"'$  (1.753 m)    Weight:  Wt Readings from Last 1 Encounters:  11/27/14 155 lb 6.8 oz (70.5 kg)    Ideal Body Weight:  73 kg  Wt Readings from Last 10 Encounters:  11/27/14 155 lb 6.8 oz (70.5 kg)  11/15/14 158 lb 12.8 oz (72.031 kg)  10/16/14 164 lb (74.39 kg)  10/15/14 164 lb 12.8 oz (74.753 kg)  09/24/14 157 lb 3.2 oz (71.305 kg)  06/28/14 159 lb 1.9 oz (72.176 kg)  06/21/14 162 lb 14.7 oz (73.9 kg)  11/21/13 150 lb 12 oz (68.38 kg)  10/24/12 144 lb (65.318 kg)  09/10/11 148 lb (67.132 kg)    BMI:  Body mass index is 22.94 kg/(m^2).  Estimated Nutritional Needs:  Kcal:  2000-2200  Protein:  100-110 gm  Fluid:  2.0-2.2 L  Skin:  Reviewed, no issues  Diet Order:  Diet NPO time specified Except for: Sips with Meds  EDUCATION NEEDS:  No education needs identified at this  time   Intake/Output Summary (Last 24 hours) at 11/27/14 1219 Last data filed at 11/27/14 0600  Gross per 24 hour  Intake    240 ml  Output    520 ml  Net   -280 ml    Last BM:  unknown  Arthur Holms, RD, LDN Pager #: [phone] After-Hours Pager #: [phone]

## 2014-11-27 NOTE — Procedures (Signed)
Successful IVC FILTER INSERTION NO COMP STABLE FULL REPORT IN PACS EBL 0

## 2014-11-27 NOTE — H&P (Addendum)
PULMONARY / CRITICAL CARE MEDICINE   Name: [name] MRN: [id number] DOB: [birth year]    ADMISSION DATE:  12/17/2014 CONSULTATION DATE:  11/27/2014  REFERRING MD :  EDP  CHIEF COMPLAINT:  Speech difficulties  INITIAL PRESENTATION:  54 y.o. M with known PE (on lovenox) and recently diagnosed lung CA , taken to [facility] ED 12/21/2014 for speech difficulties while at cancer center for initiation of chemo.  Found to have small right SAH.  Transferred to [facility] for further evaluation and PCCM consulted for medical management.   STUDIES:  CT head 5/2 >>> acute SAH within sulcus at the right parietal lobe. CXR 5/2 >>> unchanged LUL spiculated nodule, right peritracheal and left hilar adenopathy.  SIGNIFICANT EVENTS: 5/2 - admitted to [facility] ICU for small right SAH   HISTORY OF PRESENT ILLNESS:  [name] is a 54 y.o. M with PMH as outlined below including recent diagnosis of lung CA (diagnosed through biopsy via EBUS and mediastinoscopy (11/05/14 - Dr. Roxan Hockey), as well as PE diagnosed in Feb 2016 (on lovenox).  Back in February, pt noted swelling of his LE's and was awaiting venous doppler; however, went on to develop headache and SOB as well as left shoulder pain.  He was seen in ED where he hat CTA done on 09/21/14.  This revealed acute PE in RUL as well as a couple of pulmonary nodules concerning for malignancy.  MRI brain performed 2/27 was negative for mets to the brain.  PET scan performed 10/22/14 showed hypermetabolic lesions in mediastinum, paratracheal, sub-carinal, and hilar lymph nodes.  This is what prompted referral to Dr. Roxan Hockey followed by EBUS / mediastinoscopy.  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  He was scheduled for initiation of chemoradiation on 11/25/2014.  Upon arrival to cancer center that afternoon, he had acute speech difficulties.  He was sent to the ED for further evaluation.  CT of the brain revealed a small amount of sulcal SAH over the right  hemisphere.  He was subsequently transferred to [facility] for further evaluation.  He was seen in consultation by neurosurgery (Dr. Sherwood Gambler) and it was felt that his spontaneous SAH was most likely related to his antiplatelet therapy and anticoagulation given that it was not in the typical distribution of a aneurysmal SAH.  They also did recommend repeating brain MRI given that this could be related to brain mets or paraneoplastic syndrome.  Antiplatelets and anticoagulation were stopped and reversed ('50mg'$  protamine,  PCCM was consulted for medical management.    PAST MEDICAL HISTORY :   has a past medical history of HLD (hyperlipidemia); HTN (hypertension); GERD (gastroesophageal reflux disease); History of gout; Myocardial infarction (02/2005; 05/2011); Pulmonary embolism (09/21/2014); Lung mass (08/2014); and Coronary artery disease.  has past surgical history that includes left heart cath (N/A, 06/21/2011); left heart catheterization with coronary angiogram (N/A, 06/20/2014); Coronary angioplasty with stent (02/2005; 05/2011; 06/20/2014); Esophagogastroduodenoscopy (2004); Colonoscopy w/ polypectomy; Video bronchoscopy with endobronchial ultrasound (N/A, 11/05/2014); and Mediastinoscopy (N/A, 11/05/2014). Prior to Admission medications   Medication Sig Start Date End Date Taking? Authorizing Provider  aspirin 81 MG tablet Take 81 mg by mouth daily.   Yes Historical Provider, MD  atorvastatin (LIPITOR) 80 MG tablet TAKE ONE TABLET BY MOUTH ONCE DAILY 08/06/14  Yes [name], MD  clopidogrel (PLAVIX) 75 MG tablet TAKE ONE TABLET BY MOUTH ONCE DAILY 07/17/14  Yes [name], MD  diphenhydramine-acetaminophen (TYLENOL PM) 25-500 MG TABS Take 1 tablet by mouth at bedtime.   Yes  Historical Provider, MD  enoxaparin (LOVENOX) 100 MG/ML injection Inject 1 mL (100 mg total) into the skin daily. 10/15/14  Yes Tanda Rockers, MD  levofloxacin (LEVAQUIN) 500 MG tablet Take 500 mg by mouth daily.  11/23/14  Yes  Historical Provider, MD  LORazepam (ATIVAN) 1 MG tablet Take 1 mg by mouth every 6 (six) hours as needed for anxiety (anxiety).  11/22/14  Yes Historical Provider, MD  metoprolol tartrate (LOPRESSOR) 25 MG tablet TAKE ONE-HALF TABLET BY MOUTH TWICE DAILY 01/22/14  Yes [name], MD  prochlorperazine (COMPAZINE) 10 MG tablet Take 1 tablet (10 mg total) by mouth every 6 (six) hours as needed for nausea or vomiting. 11/15/14  Yes Curt Bears, MD  nitroGLYCERIN (NITROSTAT) 0.4 MG SL tablet Place 1 tablet (0.4 mg total) under the tongue every 5 (five) minutes as needed for chest pain. 06/21/14   Liliane Shi, PA-C   No Known Allergies  FAMILY HISTORY:  Family History  Problem Relation Age of Onset  . Hypertension Father     SOCIAL HISTORY:  reports that he has been smoking Cigarettes.  He has a 39 pack-year smoking history. He has never used smokeless tobacco. He reports that he uses illicit drugs (Marijuana). He reports that he does not drink alcohol.  REVIEW OF SYSTEMS:   All negative; except for those that are bolded, which indicate positives.  Constitutional: weight loss, weight gain, night sweats, fevers, chills, fatigue, weakness.  HEENT: headaches, sore throat, sneezing, nasal congestion, post nasal drip, difficulty swallowing, tooth/dental problems, visual complaints, visual changes, ear aches. Neuro: difficulty with speech, weakness, numbness, ataxia. CV:  chest pain, orthopnea, PND, swelling in lower extremities, dizziness, palpitations, syncope.  Resp: cough, hemoptysis, dyspnea, wheezing. GI  heartburn, indigestion, abdominal pain, nausea, vomiting, diarrhea, constipation, change in bowel habits, loss of appetite, hematemesis, melena, hematochezia.  GU: dysuria, change in color of urine, urgency or frequency, flank pain, hematuria. MSK: joint pain or swelling, decreased range of motion. Psych: change in mood or affect, depression, anxiety, suicidal ideations, homicidal  ideations. Skin: rash, itching, bruising.   SUBJECTIVE:  Somewhat anxious but in good spirits given his circumstances.  VITAL SIGNS: Temp:  [97.7 F (36.5 C)-98.6 F (37 C)] 98.6 F (37 C) (05/02 2345) Pulse Rate:  [91-112] 91 (05/03 0100) Resp:  [12-22] 15 (05/03 0100) BP: (120-155)/(80-93) 134/82 mmHg (05/03 0100) SpO2:  [96 %-100 %] 96 % (05/03 0100) Weight:  [70.5 kg (155 lb 6.8 oz)] 70.5 kg (155 lb 6.8 oz) (05/02 2131) HEMODYNAMICS:   VENTILATOR SETTINGS:   INTAKE / OUTPUT: Intake/Output      05/02 0701 - 05/03 0700   P.O. 240   Total Intake(mL/kg) 240 (3.4)   Urine (mL/kg/hr) 270   Total Output 270   Net -30         PHYSICAL EXAMINATION: General: Pleasant adult male, in NAD. Neuro: A&O x 3, non-focal.  HEENT: [place]/AT. PERRL, sclerae anicteric. Cardiovascular: RRR, no M/R/G.  Lungs: Respirations even and unlabored.  CTA bilaterally, No W/R/R. Abdomen: BS x 4, soft, NT/ND.  Musculoskeletal: No gross deformities, no edema.  Skin: Intact, warm, no rashes.  LABS:  CBC  Recent Labs Lab 11/20/14 0959 12/25/2014 1236 12/20/2014 1535  WBC 20.5* 21.8* 19.2*  HGB 11.7* 8.6* 8.8*  HCT 35.2* 26.2* 26.8*  PLT 429* 448* 474*   Coag's  Recent Labs Lab 12/12/2014 1535  APTT 30  INR 1.10   BMET  Recent Labs Lab 11/20/14 1001 12/20/2014 1236 12/23/2014 1535  NA 140 140 140  K 3.8 4.2 3.9  CL  --   --  106  CO2 20* 23 24  BUN 14.0 20.8 24*  CREATININE 0.8 0.8 0.85  GLUCOSE 127 100 103*   Electrolytes  Recent Labs Lab 11/20/14 1001 12/01/2014 1236 12/01/2014 1535  CALCIUM 9.6 9.7 9.8   Sepsis Markers No results for input(s): LATICACIDVEN, PROCALCITON, O2SATVEN in the last 168 hours. ABG No results for input(s): PHART, PCO2ART, PO2ART in the last 168 hours. Liver Enzymes  Recent Labs Lab 11/20/14 1001 12/07/2014 1236 12/05/2014 1535  AST '21 24 31  '$ ALT 41 40 44  ALKPHOS 115 108 111  BILITOT 0.24 <0.20 0.2*  ALBUMIN 3.7 3.6 4.3   Cardiac Enzymes No  results for input(s): TROPONINI, PROBNP in the last 168 hours. Glucose No results for input(s): GLUCAP in the last 168 hours.  Imaging Dg Chest 2 View  12/23/2014   CLINICAL DATA:  Numbness. Dysphagia. LEFT-sided numbness. History of cardiac problems.  EXAM: CHEST  2 VIEW  COMPARISON:  None.  FINDINGS: Spiculated LEFT upper lobe mass appears similar to PET-CT 09/21/2014. Fullness of the RIGHT peritracheal soft tissues most compatible with lymphadenopathy. This is a new finding compared to 09/21/2014. There is no airspace disease. No pleural effusion. Cardiac stent noted. Monitoring leads project over the chest. Cardiopericardial silhouette within normal limits.  IMPRESSION: 1. Unchanged LEFT upper lobe spiculated pulmonary nodule. 2. RIGHT peritracheal and LEFT hilar adenopathy.   Electronically Signed   By: Dereck Ligas M.D.   On: 12/09/2014 15:50   Ct Head Wo Contrast  12/22/2014   CLINICAL DATA:  LEFT side facial numbness beginning last night, recently diagnosed with lung cancer, planned chemotherapy to start today but was to week, past history of coronary artery disease post MI, hypertension, smoker  EXAM: CT HEAD WITHOUT CONTRAST  TECHNIQUE: Contiguous axial images were obtained from the base of the skull through the vertex without intravenous contrast.  COMPARISON:  None; correlation MRI brain 09/22/2014  FINDINGS: Normal ventricular morphology.  No midline shift or mass effect.  High attenuation subarachnoid hemorrhage identified within a sulcus in the RIGHT parietal lobe.  No intraparenchymal hemorrhage, definite mass lesion, or evidence acute infarction.  No additional extra-axial collections.  Atherosclerotic calcifications at the carotid siphons.  Bones unremarkable.  RIGHT ethmoid air cell opacification noted.  IMPRESSION: Acute subarachnoid hemorrhage within a sulcus at the RIGHT parietal lobe.  No other intracranial abnormalities.  Critical Value/emergent results were called by telephone at  the time of interpretation on 12/08/2014 at 1722 hr to Dr. Quintella Reichert , who verbally acknowledged these results.   Electronically Signed   By: Lavonia Dana M.D.   On: 11/28/2014 17:23    ASSESSMENT / PLAN:  NEUROLOGIC A:   Acute SAH  - likely due to antiplatelets and anticoagulation;  MRI reveals 3 mets. Neurosurgery does not feel patient is a surgical candidate.  P:   Neurosurgery and neurology following, appreciate input. Repeat CT on Thursday, if bleed is stable will start aspirin and if remains stable in a few weeks may start oral anti-coag. Neurosurg recommends radiation therapy as outpatient. Continue outpatient lorazepam PRN.  PULMONARY A: Recently diagnosed adenocarcinoma - was scheduled to start chemoradiation 12/04/2014 Known PE - on lovenox Former smoker P:   Dr. Inda Merlin notified. Lovenox d/c'd given SAH and pt received '50mg'$  Protamine. Patient is refusing IVC filter. Anticoag plan as above.  CARDIOVASCULAR A:  Hx HTN, HLD, MI, CAD s/p DES 2006 -  on plavix and aspirin Elevated troponin ().19 > 0.32) - initial EKG reassuring NSTEMI P:  Plavix and ASA d/c'd given SAH. Continue outpatient lopressor, atorvastatin. Troponin positive but will not be able to start anticoag until as planned above.. Heparin contraindicated given SAH. Will order an echo. ASA by Thursday assuming head CT is negative. Beta blockers ordered with holding parameters. Hold cards consult for now.  RENAL A:   No acute issues P:   Will order BMET now and in AM. Replace electrolytes as indicated.  GASTROINTESTINAL A:   Nutrition P:   Begin diet.[name]  HEMATOLOGIC/Oncology A:   Recently diagnosed lung CA Anemia VTE Prophylaxis P:  Day team to please notify oncology of admission (Dr. Julien Nordmann). Transfuse for Hgb < 7. SCD's only. CBC in AM. Dr. Inda Merlin notified and will address radiation therapy needs.  INFECTIOUS A:   Leukocytosis - no indication of infection, likely represents  acute phase reactant P:   Monitor clinically. Hold abx.  ENDOCRINE A:   No acute issues P:   No interventions required.  Family updated: Patient and family updated bedside.  Interdisciplinary Family Meeting v Palliative Care Meeting:  Due by: 2014/12/10.  Montey Hora, [place] [facility] Pager: [phone]  or [phone]  Above note edited in full and plan adjusted accordingly.  Will hold in the ICU given potential complications.  The patient is critically ill with multiple organ systems failure and requires high complexity decision making for assessment and support, frequent evaluation and titration of therapies, application of advanced monitoring technologies and extensive interpretation of multiple databases.   Critical Care Time devoted to patient care services described in this note is  35  Minutes. This time reflects time of care of this signee Dr Jennet Maduro. This critical care time does not reflect procedure time, or teaching time or supervisory time of PA/NP/Med student/Med Resident etc but could involve care discussion time.  Rush Farmer, M.D. [facility] Pulmonary/Critical Care Medicine. Pager: [phone]. After hours pager: [phone].  11/27/2014, 2:04 AM

## 2014-11-27 NOTE — Consult Note (Signed)
Chief Complaint: Pulmonary Embolism Lung Cancer Facial numbness Slurred Speech Sub-Arachnoid hemorrhage secondary to anticoagulation with Lovenox CAD s/p Coronary stent 05/2014,  Was on Plavix and ASA  Referring Physician(s): Dr. Titus Mould  History of Present Illness: [name] is a 54 y.o. male with Lung Cancer in February 2016. He most recently underwent a mediastinal lymph node biopsy.  He was then diagnosed with pulmonary embolism by CT on 09/21/14. He was being seen in Dr. Worthy Flank office on 5/2 and reported symptoms of slurred speech and facial numbness He was sent to the ED for evaluation. CT scan revealed Sub Arachnoid Hemorrhage. Pt was admitted and all anticoagulation was discontinued. Plan is for placement of retrievable inferior vena cava filter by Dr. Annamaria Boots   Past Medical History  Diagnosis Date  . HLD (hyperlipidemia)   . HTN (hypertension)     "took me off my BP pills" (06/20/2014)  . GERD (gastroesophageal reflux disease)     hx (06/20/2014)  . History of gout   . Myocardial infarction 02/2005; 05/2011  . Pulmonary embolism 09/21/2014    on Lovenox  . Lung mass 08/2014  . Coronary artery disease     a. s/p Taxus DES to CFX 8/06 in setting of NSTEMI;  b.  s/p Inf STEMI 06/21/11:  mRCA occluded>> PCI:  Overlapping Promus DES x 2 to RCA.;  c.  LHC (11/15):  pLAD 20-30%, oDx branches 50-60%, mCFX stent 95% ISR, dCFX stent ok, mRCA stent occluded with L-R collats, EF 50-55%, basal inf AK, mid Inf HK >> PCI:  3.25 x 23 mm Xience DES to [facility]    Past Surgical History  Procedure Laterality Date  . Left heart cath N/A 06/21/2011    Procedure: LEFT HEART CATH;  Surgeon: [name], MD;  Location: [facility] CATH LAB;  Service: Cardiovascular;  Laterality: N/A;  . Left heart catheterization with coronary angiogram N/A 06/20/2014    Procedure: LEFT HEART CATHETERIZATION WITH CORONARY ANGIOGRAM;  Surgeon: Peter M Martinique, MD;  Location: [facility] CATH LAB;  Service:  Cardiovascular;  Laterality: N/A;  . Coronary angioplasty with stent placement  02/2005; 05/2011; 06/20/2014    "1; ?2; 1"  . Esophagogastroduodenoscopy  2004  . Colonoscopy w/ polypectomy    . Video bronchoscopy with endobronchial ultrasound N/A 11/05/2014    Procedure: VIDEO BRONCHOSCOPY WITH ENDOBRONCHIAL ULTRASOUND;  Surgeon: [name], MD;  Location: [facility];  Service: Thoracic;  Laterality: N/A;  . Mediastinoscopy N/A 11/05/2014    Procedure:  MEDIASTINOSCOPY;  Surgeon: [name], MD;  Location: [facility];  Service: Thoracic;  Laterality: N/A;    Allergies: Review of patient's allergies indicates no known allergies.  Medications: Prior to Admission medications   Medication Sig Start Date End Date Taking? Authorizing Provider  aspirin 81 MG tablet Take 81 mg by mouth daily.   Yes Historical Provider, MD  atorvastatin (LIPITOR) 80 MG tablet TAKE ONE TABLET BY MOUTH ONCE DAILY 08/06/14  Yes [name], MD  clopidogrel (PLAVIX) 75 MG tablet TAKE ONE TABLET BY MOUTH ONCE DAILY 07/17/14  Yes [name], MD  diphenhydramine-acetaminophen (TYLENOL PM) 25-500 MG TABS Take 1 tablet by mouth at bedtime.   Yes Historical Provider, MD  enoxaparin (LOVENOX) 100 MG/ML injection Inject 1 mL (100 mg total) into the skin daily. 10/15/14  Yes Tanda Rockers, MD  levofloxacin (LEVAQUIN) 500 MG tablet Take 500 mg by mouth daily.  11/23/14  Yes Historical Provider, MD  LORazepam (ATIVAN) 1 MG tablet Take 1  mg by mouth every 6 (six) hours as needed for anxiety (anxiety).  11/22/14  Yes Historical Provider, MD  metoprolol tartrate (LOPRESSOR) 25 MG tablet TAKE ONE-HALF TABLET BY MOUTH TWICE DAILY 01/22/14  Yes [name], MD  prochlorperazine (COMPAZINE) 10 MG tablet Take 1 tablet (10 mg total) by mouth every 6 (six) hours as needed for nausea or vomiting. 11/15/14  Yes Curt Bears, MD  nitroGLYCERIN (NITROSTAT) 0.4 MG SL tablet Place 1 tablet (0.4 mg total) under the tongue every 5  (five) minutes as needed for chest pain. 06/21/14   Liliane Shi, PA-C     Family History  Problem Relation Age of Onset  . Hypertension Father     History   Social History  . Marital Status: Married    Spouse Name: N/A  . Number of Children: N/A  . Years of Education: N/A   Social History Main Topics  . Smoking status: Current Some Day Smoker -- 1.00 packs/day for 39 years    Types: Cigarettes  . Smokeless tobacco: Never Used     Comment: pt has not smoked in 2 days.  . Alcohol Use: No  . Drug Use: Yes    Special: Marijuana     Comment: last time 10/25/2014  . Sexual Activity: Yes   Other Topics Concern  . Not on file   Social History Narrative      Review of Systems: A 12 point ROS discussed and pertinent positives are indicated in the HPI above.  All other systems are negative.  Review of Systems  Constitutional: Positive for activity change. Negative for fever and appetite change.  Eyes: Negative for visual disturbance.  Respiratory: Negative for cough and shortness of breath.   Cardiovascular: Negative for chest pain and leg swelling.  Gastrointestinal: Negative for abdominal pain and abdominal distention.  Musculoskeletal: Positive for arthralgias.  Neurological: Positive for speech difficulty and weakness. Negative for dizziness, syncope, facial asymmetry, light-headedness and headaches.  Psychiatric/Behavioral: Negative for behavioral problems and confusion.    Vital Signs: BP 127/84 mmHg  Pulse 90  Temp(Src) 98 F (36.7 C) (Oral)  Resp 18  Ht '5\' 9"'$  (1.753 m)  Wt 70.5 kg (155 lb 6.8 oz)  BMI 22.94 kg/m2  SpO2 98%  Physical Exam  Constitutional: He is oriented to person, place, and time. He appears well-developed and well-nourished.  HENT:  Head: Normocephalic and atraumatic.  Eyes: EOM are normal.  Neck: Normal range of motion.  Cardiovascular: Normal rate, regular rhythm and normal heart sounds.   No murmur heard. Pulmonary/Chest: Effort  normal and breath sounds normal.  Abdominal: Soft. Bowel sounds are normal. He exhibits no distension. There is no tenderness.  Musculoskeletal: Normal range of motion.  Neurological: He is alert and oriented to person, place, and time. He displays normal reflexes. No cranial nerve deficit. He exhibits normal muscle tone. Coordination normal.  Skin: Skin is warm and dry.  Psychiatric: He has a normal mood and affect. His behavior is normal.  Nursing note and vitals reviewed.   Mallampati Score:  MD Evaluation Airway: WNL Heart: WNL Abdomen: WNL Chest/ Lungs: WNL ASA  Classification: 3 Mallampati/Airway Score: Two  Imaging: Dg Chest 2 View  12/17/2014   CLINICAL DATA:  Numbness. Dysphagia. LEFT-sided numbness. History of cardiac problems.  EXAM: CHEST  2 VIEW  COMPARISON:  None.  FINDINGS: Spiculated LEFT upper lobe mass appears similar to PET-CT 09/21/2014. Fullness of the RIGHT peritracheal soft tissues most compatible with lymphadenopathy. This is a new  finding compared to 09/21/2014. There is no airspace disease. No pleural effusion. Cardiac stent noted. Monitoring leads project over the chest. Cardiopericardial silhouette within normal limits.  IMPRESSION: 1. Unchanged LEFT upper lobe spiculated pulmonary nodule. 2. RIGHT peritracheal and LEFT hilar adenopathy.   Electronically Signed   By: Dereck Ligas M.D.   On: 12/23/2014 15:50   Dg Chest 2 View  11/23/2014   CLINICAL DATA:  Cough, chest pains, shortness of breath, and elevated white blood cell count  EXAM: CHEST  2 VIEW  COMPARISON:  Chest x-ray of November 12, 2014  FINDINGS: The lungs are well-expanded. The left suprahilar masslike lesion is unchanged. There is persistent right paratracheal lymphadenopathy. Subtle increased density in the left infrahilar region has developed. There is no pleural effusion. The heart and pulmonary vascularity are normal. The trachea is midline. The bony thorax is unremarkable.  IMPRESSION: 1. Persistent  left upper lobe parenchymal masslike density. There is persistent right paratracheal soft tissue fullness consistent with lymphadenopathy. 2. Subtle new increased density in the left infrahilar region may reflect developing pneumonia in the superior aspect of the left lower lobe. 3. These results will be called to the ordering clinician or representative by the Radiologist Assistant, and communication documented in the PACS or zVision Dashboard.   Electronically Signed   By: David  Martinique M.D.   On: 11/23/2014 15:31   Dg Chest 2 View  11/12/2014   CLINICAL DATA:  Dyspnea x 3-4 days after bronchoscopy 11/05/14, mediastinoscopy 11/05/14, diagnosed with lung cancer 09/21/14, patient states when he falls asleep he gets sob and it wakes him up , there is also sob on exertion, ? Pneumothorax, patient has a large knot at the sternal notch, CAD, HTN, hx of embolism  EXAM: CHEST - 2 VIEW  COMPARISON:  11/05/2014 and earlier studies  FINDINGS: Spiculated left suprahilar lesion. Lobular right paratracheal fullness has progressed specially compared to previous radiograph 09/21/2014. Right lung remains clear. Heart size normal. Atheromatous aorta. Coronary calcifications. No pneumothorax. No effusion. Visualized skeletal structures are unremarkable.  IMPRESSION: 1. Persistent left suprahilar lesion with progressive right paratracheal adenopathy.   Electronically Signed   By: Lucrezia Europe M.D.   On: 11/12/2014 16:00   Dg Chest 2 View  11/05/2014   CLINICAL DATA:  Preop, mass in the left upper lobe with adenopathy  EXAM: CHEST  2 VIEW  COMPARISON:  09/21/2014  FINDINGS: Stable appearance of the left suprahilar mass with left hilar and right peritracheal enlargement. Normal heart size and aortic contours. Bilateral coronary stents are noted. There is no edema, consolidation, effusion, or pneumothorax.  IMPRESSION: 1. No active cardiopulmonary disease. 2. Grossly stable left hilar mass.   Electronically Signed   By: Monte Fantasia  M.D.   On: 11/05/2014 06:06   Ct Head Wo Contrast  12/12/2014   CLINICAL DATA:  LEFT side facial numbness beginning last night, recently diagnosed with lung cancer, planned chemotherapy to start today but was to week, past history of coronary artery disease post MI, hypertension, smoker  EXAM: CT HEAD WITHOUT CONTRAST  TECHNIQUE: Contiguous axial images were obtained from the base of the skull through the vertex without intravenous contrast.  COMPARISON:  None; correlation MRI brain 09/22/2014  FINDINGS: Normal ventricular morphology.  No midline shift or mass effect.  High attenuation subarachnoid hemorrhage identified within a sulcus in the RIGHT parietal lobe.  No intraparenchymal hemorrhage, definite mass lesion, or evidence acute infarction.  No additional extra-axial collections.  Atherosclerotic calcifications at the carotid  siphons.  Bones unremarkable.  RIGHT ethmoid air cell opacification noted.  IMPRESSION: Acute subarachnoid hemorrhage within a sulcus at the RIGHT parietal lobe.  No other intracranial abnormalities.  Critical Value/emergent results were called by telephone at the time of interpretation on 12/20/2014 at 1722 hr to Dr. Quintella Reichert , who verbally acknowledged these results.   Electronically Signed   By: Lavonia Dana M.D.   On: 11/25/2014 17:23   Mr Jeri Cos YQ Contrast  11/27/2014   CLINICAL DATA:  56 or old male with history of newly diagnosed left lung cancer, presented with sudden onset expressive aphasia, found have acute subarachnoid hemorrhage.  EXAM: MRI HEAD WITHOUT AND WITH CONTRAST  MRV HEAD WITHOUT CONTRAST  TECHNIQUE: Multiplanar, multiecho pulse sequences of the brain and surrounding structures were obtained without and with intravenous contrast. Angiographic images of the intracranial venous structures were obtained using MRV technique without intravenous contrast.  Multiplanar multi sequence images of the brain were performed with and without the administration of  IV gadolinium. Additional dedicated MRV images were obtained.  CONTRAST:  24m MULTIHANCE GADOBENATE DIMEGLUMINE 529 MG/ML IV SOLN  COMPARISON:  Prior CT from 12/02/2014 as well as MRI from 09/22/2014.  FINDINGS: Cerebral volume within normal limits for patient age. Minimal patchy T2/FLAIR hyperintensity present within the periventricular and deep white matter both cerebral hemispheres, likely related to very mild chronic small vessel ischemic changes.  Serpiginous hyperintense FLAIR signal intensity seen layering within cortical sulci within the posterior right frontal lobe is consistent with acute subarachnoid hemorrhage. Overall, this is grossly stable in volume and distribution as compared to previous CT. No new hemorrhage identified. There is associated restricted diffusion with this subarachnoid hemorrhage.  There is abnormal restricted diffusion involving the cortical gray matter of the left frontotemporal region, consistent with acute ischemic infarct (series 3, image 21). This involves the posterior left insular cortex. No associated hemorrhage or significant mass effect. Additional small cortical infarct more posteriorly within the posterior left frontal region (series 3, image 23). Additional tiny cortical infarct more superiorly within knee postcentral gyrus of the left parietal lobe (series 3, image 37, 34).  Tiny focus of restricted diffusion adjacent to the atrium of the right lateral ventricle noted as well (series 3, image 24). Finding also may reflect a small acute ischemic infarct.  On post-contrast sequences, there is a tiny ring enhancing 4 mm lesion in the region of the right insula, compatible with a small intracranial metastasis (series 11, image 28). Additional 3 mm metastasis present within the cortical gray matter of the posterior left frontal parietal region (series 11, image 29). There is a tiny 2 mm metastasis within the left cerebellar hemisphere (series 11, image 9). Additional tiny 2  mm mass seen more superiorly within the left cerebellar hemisphere (series 11, image 13). No other definite intracranial metastases seen on post-contrast sequences. There are is a round 3 mm focus of restricted diffusion within mean right frontal lobe, seen on series 3, image 32, which may reflect a small/occult metastasis as well, as no definite subarachnoid hemorrhage or infarct is seen associated with this lesion. There is an additional 3 mm metastasis within the peripheral right frontal lobe (series 12, image 17). This is seen in the region of the acute subarachnoid hemorrhage.  No midline shift or significant mass effect. No hydrocephalus. No extra-axial fluid collection.  Craniocervical junction within normal limits. Pituitary gland normal.  No acute abnormality about the orbits.  Scattered opacity present within the ethmoidal air  cells, right greater than left. Small amount of mucosal thickening present within level left maxillary sinus. No mastoid effusion. Inner ear structures are normal. Normal intravascular flow voids are maintained.  Bone marrow signal intensity within normal limits. No scalp soft tissue abnormality.  Dedicated MRV images demonstrate no filling defect to suggest venous sinus thrombosis. Superior sagittal sinus, transverse sinuses, and sigmoid sinuses are widely patent. Visualize internal jugular veins are widely patent. Deep venous structures patent.  IMPRESSION: 1. 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 No significant mass effect or associated hemorrhage. 2. Additional subcentimeter focus of restricted diffusion adjacent to the atrium of the right lateral ventricle, also suspicious for small acute ischemic infarct. 3. Stable volume and distribution of acute subarachnoid hemorrhage within the right frontal region as compared to prior CT. No new intracranial hemorrhage. 4. 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 One of these lesions lies in close proximity to the acute subarachnoid hemorrhage within the right frontal lobe, raising the suspicion that this is the source of bleeding. 5. Normal MRV of the brain.   Electronically Signed   By: Jeannine Boga M.D.   On: 11/27/2014 06:38   [name]  11/27/2014   CLINICAL DATA:  69 or old male with history of newly diagnosed left lung cancer, presented with sudden onset expressive aphasia, found have acute subarachnoid hemorrhage.  EXAM: MRI HEAD WITHOUT AND WITH CONTRAST  MRV HEAD WITHOUT CONTRAST  TECHNIQUE: Multiplanar, multiecho pulse sequences of the brain and surrounding structures were obtained without and with intravenous contrast. Angiographic images of the intracranial venous structures were obtained using MRV technique without intravenous contrast.  Multiplanar multi sequence images of the brain were performed with and without the administration of IV gadolinium. Additional dedicated MRV images were obtained.  CONTRAST:  59m MULTIHANCE GADOBENATE DIMEGLUMINE 529 MG/ML IV SOLN  COMPARISON:  Prior CT from 11/25/2014 as well as MRI from 09/22/2014.  FINDINGS: Cerebral volume within normal limits for patient age. Minimal patchy T2/FLAIR hyperintensity present within the periventricular and deep white matter both cerebral hemispheres, likely related to very mild chronic small vessel ischemic changes.  Serpiginous hyperintense FLAIR signal intensity seen layering within cortical sulci within the posterior right frontal lobe is consistent with acute subarachnoid hemorrhage. Overall, this is grossly stable in volume and distribution as compared to previous CT. No new hemorrhage identified. There is associated restricted diffusion with this subarachnoid hemorrhage.  There is abnormal restricted diffusion involving the cortical gray  matter of the left frontotemporal region, consistent with acute ischemic infarct (series 3, image 21). This involves the posterior left insular cortex. No associated hemorrhage or significant mass effect. Additional small cortical infarct more posteriorly within the posterior left frontal region (series 3, image 23). Additional tiny cortical infarct more superiorly within knee postcentral gyrus of the left parietal lobe (series 3, image 37, 34).  Tiny focus of restricted diffusion adjacent to the atrium of the right lateral ventricle noted as well (series 3, image 24). Finding also may reflect a small acute ischemic infarct.  On post-contrast sequences, there is a tiny ring enhancing 4 mm lesion in the region of the right insula, compatible with a small intracranial metastasis (series 11, image 28). Additional 3 mm metastasis present within the cortical gray matter of the posterior left frontal parietal region (series 11, image 29). There is  a tiny 2 mm metastasis within the left cerebellar hemisphere (series 11, image 9). Additional tiny 2 mm mass seen more superiorly within the left cerebellar hemisphere (series 11, image 13). No other definite intracranial metastases seen on post-contrast sequences. There are is a round 3 mm focus of restricted diffusion within mean right frontal lobe, seen on series 3, image 32, which may reflect a small/occult metastasis as well, as no definite subarachnoid hemorrhage or infarct is seen associated with this lesion. There is an additional 3 mm metastasis within the peripheral right frontal lobe (series 12, image 17). This is seen in the region of the acute subarachnoid hemorrhage.  No midline shift or significant mass effect. No hydrocephalus. No extra-axial fluid collection.  Craniocervical junction within normal limits. Pituitary gland normal.  No acute abnormality about the orbits.  Scattered opacity present within the ethmoidal air cells, right greater than left. Small  amount of mucosal thickening present within level left maxillary sinus. No mastoid effusion. Inner ear structures are normal. Normal intravascular flow voids are maintained.  Bone marrow signal intensity within normal limits. No scalp soft tissue abnormality.  Dedicated MRV images demonstrate no filling defect to suggest venous sinus thrombosis. Superior sagittal sinus, transverse sinuses, and sigmoid sinuses are widely patent. Visualize internal jugular veins are widely patent. Deep venous structures patent.  IMPRESSION: 1. 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 No significant mass effect or associated hemorrhage. 2. Additional subcentimeter focus of restricted diffusion adjacent to the atrium of the right lateral ventricle, also suspicious for small acute ischemic infarct. 3. Stable volume and distribution of acute subarachnoid hemorrhage within the right frontal region as compared to prior CT. No new intracranial hemorrhage. 4. 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 One of these lesions lies in close proximity to the acute subarachnoid hemorrhage within the right frontal lobe, raising the suspicion that this is the source of bleeding. 5. Normal MRV of the brain.   Electronically Signed   By: Jeannine Boga M.D.   On: 11/27/2014 06:38   Dg Chest Port 1 View  11/27/2014   CLINICAL DATA:  Hypertension.  Evaluate for metastatic disease.  EXAM: PORTABLE CHEST - 1 VIEW  COMPARISON:  12/25/2014.  PET-CT 10/02/2014.  CT chest 09/21/2014.  FINDINGS: Stable changes of mediastinal and left hilar adenopathy. Persistent ill-defined density left upper lobe. No pleural effusion or pneumothorax. Heart size stable. No pneumothorax. No acute osseus abnormality.  IMPRESSION: 1. Persistent mediastinal and left hilar adenopathy. 2.  Persistent ill-defined density left upper lobe . No new lesions identified.   Electronically Signed   By: Marcello Moores  Register   On: 11/27/2014 07:20    Labs:  CBC:  Recent Labs  11/15/14 1340 11/20/14 0959 12/23/2014 1236 12/24/2014 1535  WBC 14.5* 20.5* 21.8* 19.2*  HGB 11.5* 11.7* 8.6* 8.8*  HCT 33.7* 35.2* 26.2* 26.8*  PLT 418* 429* 448* 474*    COAGS:  Recent Labs  06/20/14 0900 10/26/14 1003 12/01/2014 1535  INR 1.00 1.11 1.10  APTT  --  33 30    BMP:  Recent Labs  09/21/14 1335 09/22/14 0457 10/26/14 1003 11/15/14 1340 11/20/14 1001 12/02/2014 1236 12/24/2014 1535  NA 136 136 136 144 140 140 140  K 3.8 4.0 4.5 3.6 3.8 4.2 3.9  CL 109 102 106  --   --   --  106  CO2 '21 23 21 '$ 20* 20* 23 24  GLUCOSE 100* 103* 106* 130 127 100 103*  BUN '11 10 17 '$ 12.8 14.0 20.8 24*  CALCIUM 8.2* 8.9 10.0 9.5 9.6 9.7 9.8  CREATININE 0.73 0.87 0.92 0.8 0.8 0.8 0.85  GFRNONAA >90 >90 >90  --   --   --  >60  GFRAA >90 >90 >90  --   --   --  >60    LIVER FUNCTION TESTS:  Recent Labs  11/15/14 1340 11/20/14 1001 12/16/2014 1236 12/20/2014 1535  BILITOT 0.30 0.24 <0.20 0.2*  AST '13 21 24 31  '$ ALT 17 41 40 44  ALKPHOS 108 115 108 111  PROT 7.2 7.5 7.2 7.8  ALBUMIN 3.6 3.7 3.6 4.3    TUMOR MARKERS: No results for input(s): AFPTM, CEA, CA199, CHROMGRNA in the last 8760 hours.  Assessment and Plan: Recent diagnosis of Lung Cancer February 2016. Pulmonary embolism by CT on 09/21/14. Reported symptoms of slurred speech and facial numbness CT scan revealed Sub Arachnoid Hemorrhage. All anticoagulation was discontinued. Plan is for placement of retrievable inferior vena cava filter by Dr. Annamaria Boots  Risks and Benefits discussed with the patient including, but not limited to bleeding, infection, contrast induced renal failure, filter fracture or migration which can lead to emergency surgery or even death, strut penetration with damage or irritation to adjacent structures and caval  thrombosis. All of the patient's questions were answered, patient is agreeable to proceed. Consent signed and in chart.    Thank you for this interesting consult.  I greatly enjoyed meeting [place] and look forward to participating in their care.  Signed: Ebba Goll A 11/27/2014, 9:33 AM   I spent a total of 20 Minutes    in face to face in clinical consultation, greater than 50% of which was counseling/coordinating care for Placement of retrievable IVC Filter by Dr.Shick

## 2014-11-27 NOTE — Progress Notes (Signed)
Subjective: Patient resting in bed, without complaints. He presented with acute stroke syndrome yesterday afternoon and evaluated at [facility] emergency room, neurology called by ER staff, seen by neurology earlier this morning.  MRI of brain done earlier this morning, and reveals evidence of multiple small left hemispheric cortical infarcts, consistent with presenting acute stroke syndrome yesterday. Also found to have several small areas of enhancement, all subcentimeter, consistent with multiple brain metastases from his lung cancer.  Objective: Vital signs in last 24 hours: Filed Vitals:   11/27/14 0600 11/27/14 0647 11/27/14 0700 11/27/14 0732  BP: 121/83  138/69   Pulse: 99 85 94   Temp:    98 F (36.7 C)  TempSrc:    Oral  Resp: '16 18 23   '$ Height:      Weight:      SpO2: 100% 100% 99%     Intake/Output from previous day: 05/02 0701 - 05/03 0700 In: 240 [P.O.:240] Out: 520 [Urine:520] Intake/Output this shift:    Physical Exam:  Awake alert, oriented to his name, [facility] hospital, [place], and 11/27/2014. Speech fluent, following commands. Pupils equal, round, reactive to light. EOMI. Facial movements symmetrical. Moving all 4 extremity is well. No drift of upper extremities.  CBC  Recent Labs  11/27/2014 1236 12/13/2014 1535  WBC 21.8* 19.2*  HGB 8.6* 8.8*  HCT 26.2* 26.8*  PLT 448* 474*   BMET  Recent Labs  12/14/2014 1236 12/25/2014 1535  NA 140 140  K 4.2 3.9  CL  --  106  CO2 23 24  GLUCOSE 100 103*  BUN 20.8 24*  CREATININE 0.8 0.85  CALCIUM 9.7 9.8    Studies/Results: Dg Chest 2 View  12/11/2014   CLINICAL DATA:  Numbness. Dysphagia. LEFT-sided numbness. History of cardiac problems.  EXAM: CHEST  2 VIEW  COMPARISON:  None.  FINDINGS: Spiculated LEFT upper lobe mass appears similar to PET-CT 09/21/2014. Fullness of the RIGHT peritracheal soft tissues most compatible with lymphadenopathy. This is a new finding compared to 09/21/2014.  There is no airspace disease. No pleural effusion. Cardiac stent noted. Monitoring leads project over the chest. Cardiopericardial silhouette within normal limits.  IMPRESSION: 1. Unchanged LEFT upper lobe spiculated pulmonary nodule. 2. RIGHT peritracheal and LEFT hilar adenopathy.   Electronically Signed   By: Dereck Ligas M.D.   On: 12/25/2014 15:50   Ct Head Wo Contrast  11/25/2014   CLINICAL DATA:  LEFT side facial numbness beginning last night, recently diagnosed with lung cancer, planned chemotherapy to start today but was to week, past history of coronary artery disease post MI, hypertension, smoker  EXAM: CT HEAD WITHOUT CONTRAST  TECHNIQUE: Contiguous axial images were obtained from the base of the skull through the vertex without intravenous contrast.  COMPARISON:  None; correlation MRI brain 09/22/2014  FINDINGS: Normal ventricular morphology.  No midline shift or mass effect.  High attenuation subarachnoid hemorrhage identified within a sulcus in the RIGHT parietal lobe.  No intraparenchymal hemorrhage, definite mass lesion, or evidence acute infarction.  No additional extra-axial collections.  Atherosclerotic calcifications at the carotid siphons.  Bones unremarkable.  RIGHT ethmoid air cell opacification noted.  IMPRESSION: Acute subarachnoid hemorrhage within a sulcus at the RIGHT parietal lobe.  No other intracranial abnormalities.  Critical Value/emergent results were called by telephone at the time of interpretation on 12/21/2014 at 1722 hr to Dr. Quintella Reichert , who verbally acknowledged these results.   Electronically Signed   By: Crist Infante.D.  On: 12/18/2014 17:23   Mr Jeri Cos FT Contrast  11/27/2014   CLINICAL DATA:  5 or old male with history of newly diagnosed left lung cancer, presented with sudden onset expressive aphasia, found have acute subarachnoid hemorrhage.  EXAM: MRI HEAD WITHOUT AND WITH CONTRAST  MRV HEAD WITHOUT CONTRAST  TECHNIQUE: Multiplanar, multiecho  pulse sequences of the brain and surrounding structures were obtained without and with intravenous contrast. Angiographic images of the intracranial venous structures were obtained using MRV technique without intravenous contrast.  Multiplanar multi sequence images of the brain were performed with and without the administration of IV gadolinium. Additional dedicated MRV images were obtained.  CONTRAST:  78m MULTIHANCE GADOBENATE DIMEGLUMINE 529 MG/ML IV SOLN  COMPARISON:  Prior CT from 12/01/2014 as well as MRI from 09/22/2014.  FINDINGS: Cerebral volume within normal limits for patient age. Minimal patchy T2/FLAIR hyperintensity present within the periventricular and deep white matter both cerebral hemispheres, likely related to very mild chronic small vessel ischemic changes.  Serpiginous hyperintense FLAIR signal intensity seen layering within cortical sulci within the posterior right frontal lobe is consistent with acute subarachnoid hemorrhage. Overall, this is grossly stable in volume and distribution as compared to previous CT. No new hemorrhage identified. There is associated restricted diffusion with this subarachnoid hemorrhage.  There is abnormal restricted diffusion involving the cortical gray matter of the left frontotemporal region, consistent with acute ischemic infarct (series 3, image 21). This involves the posterior left insular cortex. No associated hemorrhage or significant mass effect. Additional small cortical infarct more posteriorly within the posterior left frontal region (series 3, image 23). Additional tiny cortical infarct more superiorly within knee postcentral gyrus of the left parietal lobe (series 3, image 37, 34).  Tiny focus of restricted diffusion adjacent to the atrium of the right lateral ventricle noted as well (series 3, image 24). Finding also may reflect a small acute ischemic infarct.  On post-contrast sequences, there is a tiny ring enhancing 4 mm lesion in the region of  the right insula, compatible with a small intracranial metastasis (series 11, image 28). Additional 3 mm metastasis present within the cortical gray matter of the posterior left frontal parietal region (series 11, image 29). There is a tiny 2 mm metastasis within the left cerebellar hemisphere (series 11, image 9). Additional tiny 2 mm mass seen more superiorly within the left cerebellar hemisphere (series 11, image 13). No other definite intracranial metastases seen on post-contrast sequences. There are is a round 3 mm focus of restricted diffusion within mean right frontal lobe, seen on series 3, image 32, which may reflect a small/occult metastasis as well, as no definite subarachnoid hemorrhage or infarct is seen associated with this lesion. There is an additional 3 mm metastasis within the peripheral right frontal lobe (series 12, image 17). This is seen in the region of the acute subarachnoid hemorrhage.  No midline shift or significant mass effect. No hydrocephalus. No extra-axial fluid collection.  Craniocervical junction within normal limits. Pituitary gland normal.  No acute abnormality about the orbits.  Scattered opacity present within the ethmoidal air cells, right greater than left. Small amount of mucosal thickening present within level left maxillary sinus. No mastoid effusion. Inner ear structures are normal. Normal intravascular flow voids are maintained.  Bone marrow signal intensity within normal limits. No scalp soft tissue abnormality.  Dedicated MRV images demonstrate no filling defect to suggest venous sinus thrombosis. Superior sagittal sinus, transverse sinuses, and sigmoid sinuses are widely patent. Visualize internal jugular  veins are widely patent. Deep venous structures patent.  IMPRESSION: 1. 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 No significant mass effect or associated hemorrhage. 2. Additional subcentimeter focus of  restricted diffusion adjacent to the atrium of the right lateral ventricle, also suspicious for small acute ischemic infarct. 3. Stable volume and distribution of acute subarachnoid hemorrhage within the right frontal region as compared to prior CT. No new intracranial hemorrhage. 4. 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 One of these lesions lies in close proximity to the acute subarachnoid hemorrhage within the right frontal lobe, raising the suspicion that this is the source of bleeding. 5. Normal MRV of the brain.   Electronically Signed   By: Jeannine Boga M.D.   On: 11/27/2014 06:38   [name]  11/27/2014   CLINICAL DATA:  35 or old male with history of newly diagnosed left lung cancer, presented with sudden onset expressive aphasia, found have acute subarachnoid hemorrhage.  EXAM: MRI HEAD WITHOUT AND WITH CONTRAST  MRV HEAD WITHOUT CONTRAST  TECHNIQUE: Multiplanar, multiecho pulse sequences of the brain and surrounding structures were obtained without and with intravenous contrast. Angiographic images of the intracranial venous structures were obtained using MRV technique without intravenous contrast.  Multiplanar multi sequence images of the brain were performed with and without the administration of IV gadolinium. Additional dedicated MRV images were obtained.  CONTRAST:  5m MULTIHANCE GADOBENATE DIMEGLUMINE 529 MG/ML IV SOLN  COMPARISON:  Prior CT from 12/23/2014 as well as MRI from 09/22/2014.  FINDINGS: Cerebral volume within normal limits for patient age. Minimal patchy T2/FLAIR hyperintensity present within the periventricular and deep white matter both cerebral hemispheres, likely related to very mild chronic small vessel ischemic changes.  Serpiginous hyperintense FLAIR signal intensity seen layering within cortical sulci within the posterior  right frontal lobe is consistent with acute subarachnoid hemorrhage. Overall, this is grossly stable in volume and distribution as compared to previous CT. No new hemorrhage identified. There is associated restricted diffusion with this subarachnoid hemorrhage.  There is abnormal restricted diffusion involving the cortical gray matter of the left frontotemporal region, consistent with acute ischemic infarct (series 3, image 21). This involves the posterior left insular cortex. No associated hemorrhage or significant mass effect. Additional small cortical infarct more posteriorly within the posterior left frontal region (series 3, image 23). Additional tiny cortical infarct more superiorly within knee postcentral gyrus of the left parietal lobe (series 3, image 37, 34).  Tiny focus of restricted diffusion adjacent to the atrium of the right lateral ventricle noted as well (series 3, image 24). Finding also may reflect a small acute ischemic infarct.  On post-contrast sequences, there is a tiny ring enhancing 4 mm lesion in the region of the right insula, compatible with a small intracranial metastasis (series 11, image 28). Additional 3 mm metastasis present within the cortical gray matter of the posterior left frontal parietal region (series 11, image 29). There is a tiny 2 mm metastasis within the left cerebellar hemisphere (series 11, image 9). Additional tiny 2 mm mass seen more superiorly within the left cerebellar hemisphere (series 11, image 13). No other definite intracranial metastases seen on post-contrast sequences. There are is a round 3 mm focus of restricted diffusion within mean right frontal lobe, seen on series 3, image 32, which may reflect a small/occult metastasis as well,  as no definite subarachnoid hemorrhage or infarct is seen associated with this lesion. There is an additional 3 mm metastasis within the peripheral right frontal lobe (series 12, image 17). This is seen in the region of the  acute subarachnoid hemorrhage.  No midline shift or significant mass effect. No hydrocephalus. No extra-axial fluid collection.  Craniocervical junction within normal limits. Pituitary gland normal.  No acute abnormality about the orbits.  Scattered opacity present within the ethmoidal air cells, right greater than left. Small amount of mucosal thickening present within level left maxillary sinus. No mastoid effusion. Inner ear structures are normal. Normal intravascular flow voids are maintained.  Bone marrow signal intensity within normal limits. No scalp soft tissue abnormality.  Dedicated MRV images demonstrate no filling defect to suggest venous sinus thrombosis. Superior sagittal sinus, transverse sinuses, and sigmoid sinuses are widely patent. Visualize internal jugular veins are widely patent. Deep venous structures patent.  IMPRESSION: 1. 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 No significant mass effect or associated hemorrhage. 2. Additional subcentimeter focus of restricted diffusion adjacent to the atrium of the right lateral ventricle, also suspicious for small acute ischemic infarct. 3. Stable volume and distribution of acute subarachnoid hemorrhage within the right frontal region as compared to prior CT. No new intracranial hemorrhage. 4. 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 One of these lesions lies in close proximity to the acute subarachnoid hemorrhage within the right frontal lobe, raising the suspicion that this is the source of bleeding. 5. Normal MRV of the brain.   Electronically Signed   By: Jeannine Boga M.D.   On: 11/27/2014 06:38   Dg Chest Port 1 View  11/27/2014   CLINICAL DATA:  Hypertension.  Evaluate for metastatic disease.  EXAM: PORTABLE CHEST - 1 VIEW  COMPARISON:  12/07/2014.   PET-CT 10/02/2014.  CT chest 09/21/2014.  FINDINGS: Stable changes of mediastinal and left hilar adenopathy. Persistent ill-defined density left upper lobe. No pleural effusion or pneumothorax. Heart size stable. No pneumothorax. No acute osseus abnormality.  IMPRESSION: 1. Persistent mediastinal and left hilar adenopathy. 2. Persistent ill-defined density left upper lobe . No new lesions identified.   Electronically Signed   By: Marcello Moores  Register   On: 11/27/2014 07:20    Assessment/Plan: Patient with history of recently diagnosed lung cancer, recently diagnosed pulmonary embolism, atherosclerotic cardiovascular disease status post stenting, myocardial infarction, hypertension, hyperlipidemia, GERD, and gout, who presented with an acute stroke syndrome, with speech difficulties lasting about 10 minutes, yesterday afternoon, and was found a CT scan to have evidence of a small right hemispheric sulcal, nonaneurysmal, subarachnoid hemorrhage. Neurology consulted regarding acute stroke syndrome yesterday afternoon, and saw patient earlier this morning. Neurosurgical consultation obtained urgently on recommendation of neurologist who had not seen patient.  Patient had been on aspirin, Plavix, and Lovenox, all of which have been stopped. Placement of IVC filter being considered in light of recent history of pulmonary embolism and contraindication currently for anticoagulation because of intracranial hemorrhage.  Neurologic examination at this time remains intact and stable.  MRI of the brain earlier this morning demonstrates evidence of acute left hemispheric cortical infarcts consistent with his acute stroke syndrome yesterday and evidence of multiple subcentimeter brain metastases from his lung cancer. Workup and management of stroke defer to neurology consultant, however brain metastases are amenable to stereotactic radiosurgery [facility]). I  will request a 3T SRS protocol MRI of the brain for treatment planning,  and consult radiation oncology.  I have discussed my assessment and recommendations with the patient and his wife, and reviewed the findings of the MRI. Their questions were answered for them.  I have also discussed the case with Dr. Antony Contras, from the stroke neurology service.   Hosie Spangle, MD 11/27/2014, 8:17 AM

## 2014-11-27 NOTE — Progress Notes (Signed)
Bedside EEG completed, results pending. 

## 2014-11-27 NOTE — Progress Notes (Signed)
[place] Progress Note Patient Name: NYHEEM BINETTE DOB: [birth year] MRN: [id number]   Date of Service  11/27/2014  HPI/Events of Note  Pain shoulder R/o ,mets  eICU Interventions  xray     Intervention Category Minor Interventions: Routine modifications to care plan (e.g. PRN medications for pain, fever)  Raylene Miyamoto. 11/27/2014, 5:14 PM

## 2014-11-27 NOTE — Procedures (Signed)
ELECTROENCEPHALOGRAM REPORT   Patient: [name]      Room #: 45M-02 Age: 54 y.o.        Sex: male Referring Physician: Dr Titus Mould Report Date:  11/27/2014        Interpreting Physician: Hulen Luster  History: [name] is an 54 y.o. male hx of lung CA presenting with transient sensory and speech deficits. Found to have [place].   Medications:  Scheduled: . atorvastatin  80 mg Oral Daily  . metoprolol tartrate  12.5 mg Oral BID  . sodium chloride  3 mL Intravenous Q12H    Conditions of Recording:  This is a 16 channel EEG carried out with the patient in the awake state.  Description:  The waking background activity consists of a low voltage, symmetrical, fairly well organized, 8-9 Hz alpha activity, seen from the parieto-occipital and posterior temporal regions.  Low voltage fast activity, poorly organized, is seen anteriorly and is at times superimposed on more posterior regions.  A mixture of theta and alpha rhythms are seen from the central and temporal regions. No focal slowing or epileptiform activity is noted.   Normal sleep architecture is not observed. Hyperventilation and intermittent photic stimulation was not obtained.   IMPRESSION: Normal electroencephalogram. There are no focal lateralizing or epileptiform features.   Jim Like, DO [facility] [phone]  If 7pm- 7am, please page neurology on call as listed in AMION. 11/27/2014, 11:15 AM

## 2014-11-28 ENCOUNTER — Inpatient Hospital Stay (HOSPITAL_COMMUNITY): Payer: 59

## 2014-11-28 ENCOUNTER — Ambulatory Visit: Admit: 2014-11-28 | Payer: 59 | Admitting: Radiation Oncology

## 2014-11-28 ENCOUNTER — Ambulatory Visit: Admission: RE | Admit: 2014-11-28 | Payer: 59 | Source: Ambulatory Visit | Admitting: Radiation Oncology

## 2014-11-28 ENCOUNTER — Encounter: Payer: Self-pay | Admitting: *Deleted

## 2014-11-28 ENCOUNTER — Ambulatory Visit: Payer: 59

## 2014-11-28 ENCOUNTER — Ambulatory Visit: Payer: 59 | Admitting: Radiation Oncology

## 2014-11-28 DIAGNOSIS — M25512 Pain in left shoulder: Secondary | ICD-10-CM

## 2014-11-28 DIAGNOSIS — I509 Heart failure, unspecified: Secondary | ICD-10-CM

## 2014-11-28 DIAGNOSIS — C7931 Secondary malignant neoplasm of brain: Secondary | ICD-10-CM

## 2014-11-28 LAB — LIPID PANEL
CHOL/HDL RATIO: 5.4 ratio
Cholesterol: 172 mg/dL (ref 0–200)
HDL: 32 mg/dL — AB (ref >40)
LDL CALC: 107 mg/dL — AB (ref 0–99)
TRIGLYCERIDES: 166 mg/dL — AB (ref <150)
VLDL: 33 mg/dL (ref 0–40)

## 2014-11-28 LAB — CBC
HCT: 23.6 % — ABNORMAL LOW (ref 39.0–52.0)
Hemoglobin: 8 g/dL — ABNORMAL LOW (ref 13.0–17.0)
MCH: 31 pg (ref 26.0–34.0)
MCHC: 33.9 g/dL (ref 30.0–36.0)
MCV: 91.5 fL (ref 78.0–100.0)
PLATELETS: 376 10*3/uL (ref 150–400)
RBC: 2.58 MIL/uL — ABNORMAL LOW (ref 4.22–5.81)
RDW: 13.6 % (ref 11.5–15.5)
WBC: 18.5 10*3/uL — ABNORMAL HIGH (ref 4.0–10.5)

## 2014-11-28 LAB — BASIC METABOLIC PANEL
Anion gap: 11 (ref 5–15)
BUN: 15 mg/dL (ref 6–20)
CO2: 22 mmol/L (ref 22–32)
Calcium: 9.4 mg/dL (ref 8.9–10.3)
Chloride: 100 mmol/L — ABNORMAL LOW (ref 101–111)
Creatinine, Ser: 0.82 mg/dL (ref 0.61–1.24)
GFR calc Af Amer: >60 mL/min (ref >60)
GFR calc non Af Amer: >60 mL/min (ref >60)
Glucose, Bld: 143 mg/dL — ABNORMAL HIGH (ref 70–99)
POTASSIUM: 4.2 mmol/L (ref 3.5–5.1)
SODIUM: 133 mmol/L — AB (ref 135–145)

## 2014-11-28 LAB — MAGNESIUM: Magnesium: 1.9 mg/dL (ref 1.7–2.4)

## 2014-11-28 LAB — PHOSPHORUS: Phosphorus: 4.2 mg/dL (ref 2.5–4.6)

## 2014-11-28 MED ORDER — MAGNESIUM SULFATE 2 GM/50ML IV SOLN
2.0000 g | Freq: Once | INTRAVENOUS | Status: AC
  Administered 2014-11-28: 2 g via INTRAVENOUS
  Filled 2014-11-28: qty 50

## 2014-11-28 MED ORDER — DIPHENHYDRAMINE HCL 12.5 MG/5ML PO ELIX
12.5000 mg | ORAL_SOLUTION | Freq: Four times a day (QID) | ORAL | Status: DC | PRN
Start: 2014-11-28 — End: 2014-11-28
  Filled 2014-11-28: qty 5

## 2014-11-28 MED ORDER — NALOXONE HCL 0.4 MG/ML IJ SOLN: 0.4000 mg | INTRAMUSCULAR | Status: DC | PRN

## 2014-11-28 MED ORDER — FENTANYL CITRATE (PF) 100 MCG/2ML IJ SOLN
75.0000 ug | Freq: Once | INTRAMUSCULAR | Status: AC
  Administered 2014-11-28: 75 ug via INTRAVENOUS

## 2014-11-28 MED ORDER — ONDANSETRON HCL 4 MG/2ML IJ SOLN
4.0000 mg | Freq: Four times a day (QID) | INTRAMUSCULAR | Status: DC | PRN
  Administered 2014-11-28: 4 mg via INTRAVENOUS
  Filled 2014-11-28: qty 2

## 2014-11-28 MED ORDER — HYDROMORPHONE HCL 1 MG/ML IJ SOLN
1.0000 mg | INTRAMUSCULAR | Status: DC | PRN
  Administered 2014-11-28 – 2014-11-29 (×4): 1 mg via INTRAVENOUS
  Filled 2014-11-28 (×5): qty 1

## 2014-11-28 MED ORDER — DIPHENHYDRAMINE HCL 50 MG/ML IJ SOLN: 12.5000 mg | Freq: Four times a day (QID) | INTRAMUSCULAR | Status: DC | PRN

## 2014-11-28 MED ORDER — SODIUM CHLORIDE 0.9 % IJ SOLN: 9.0000 mL | INTRAMUSCULAR | Status: DC | PRN

## 2014-11-28 MED ORDER — FENTANYL 10 MCG/ML IV SOLN
INTRAVENOUS | Status: DC
  Administered 2014-11-28: 12:00:00 via INTRAVENOUS
  Administered 2014-11-28 (×2): 150 ug via INTRAVENOUS
  Filled 2014-11-28: qty 50

## 2014-11-28 NOTE — Progress Notes (Signed)
Spoke with Jenny Reichmann, RN caring for patient today. Obtain report and alerted him to patient's simulation appt at [facility] rad onc on 5/6 at 1300. Learned patient has a PCA pump, a right AC patent IV and is without precautions. Spoke with [employer] @ [place]. Arrangements made to transport patient from [facility] to [place] for 5/6 1300 appointment.

## 2014-11-28 NOTE — [facility] Oncology Navigator Note (Unsigned)
Ms. Vantine called and left me a vm message regarding next steps.  I discussed with Dr. Julien Nordmann.  I update wife on next steps per Dr. Julien Nordmann.

## 2014-11-28 NOTE — Progress Notes (Signed)
Echocardiogram 2D Echocardiogram has been performed.  [name] 11/28/2014, 8:42 AM

## 2014-11-28 NOTE — Progress Notes (Signed)
Subjective: Patient resting in bed, his wife at his bedside, complaining of left shoulder pain, otherwise without complaints. Patient denies neck pain, and does not describe pain in a radicular pattern. Report of EEG is normal, echocardiogram just completed, results pending. X-ray of the left shoulder done by CCM, reported as normal.  3T SRS protocol MRI the brain without and with contrast put off until tomorrow by radiology department. Patient seen yesterday by Dr. Ledon Snare from radiation oncology regarding [place].  Objective: Vital signs in last 24 hours: Filed Vitals:   11/28/14 0600 11/28/14 0726 11/28/14 0727 11/28/14 0800  BP: 166/78 162/87  147/75  Pulse: 95 88  87  Temp:   98.1 F (36.7 C)   TempSrc:   Oral   Resp: '13 14  11  '$ Height:      Weight: 71.9 kg (158 lb 8.2 oz)     SpO2: 97% 100%  94%    Intake/Output from previous day: 05/03 0701 - 05/04 0700 In: 700 [P.O.:700] Out: 925 [Urine:925] Intake/Output this shift:    Physical Exam:  Awake and alert, oriented to name, [name] Parsons, [name], and [name]. Following commands. Speech fluent. Pupils equal round and reactive to light, and about 3.5 mm bilaterally. EOMI. Facial movements symmetrical. Moving all 4 extremities well. No drift of upper extremities. No tenderness around left shoulder. No discomfort on passive range of motion of left shoulder.  CBC  Recent Labs  11/27/14 1455 11/28/14 0235  WBC 16.1* 18.5*  HGB 7.8* 8.0*  HCT 23.5* 23.6*  PLT 382 376   BMET  Recent Labs  11/27/14 1228 11/28/14 0235  NA 135 133*  K 3.7 4.2  CL 102 100*  CO2 22 22  GLUCOSE 115* 143*  BUN 17 15  CREATININE 0.84 0.82  CALCIUM 9.1 9.4    Studies/Results: Dg Chest 2 View  05/07/Parsons   CLINICAL DATA:  Numbness. Dysphagia. LEFT-sided numbness. History of cardiac problems.  EXAM: CHEST  2 VIEW  COMPARISON:  None.  FINDINGS: Spiculated LEFT upper lobe mass appears similar to PET-CT 02/26/Parsons.  Fullness of the RIGHT peritracheal soft tissues most compatible with lymphadenopathy. This is a new finding compared to 2/26/Parsons. There is no airspace disease. No pleural effusion. Cardiac stent noted. Monitoring leads project over the chest. Cardiopericardial silhouette within normal limits.  IMPRESSION: 1. Unchanged LEFT upper lobe spiculated pulmonary nodule. 2. RIGHT peritracheal and LEFT hilar adenopathy.   Electronically Signed   By: Dereck Ligas M.D.   On: 05/04/Parsons 15:50   Dg Shoulder 1v Left  5/3/Parsons   CLINICAL DATA:  54 year old male with left shoulder pain. Known stage IIIB lung cancer. Evaluate for metastasis.  EXAM: LEFT SHOULDER - 1 VIEW  COMPARISON:  Prior PET-CT 03/08/Parsons  FINDINGS: There is no evidence of fracture or dislocation. There is no evidence of arthropathy or other focal bone abnormality. No lytic or blastic osseous lesion. Soft tissues are unremarkable.  IMPRESSION: Negative.   Electronically Signed   By: Jacqulynn Cadet M.D.   On: 05/03/Parsons 18:17   Ct Head Wo Contrast  05/06/Parsons   CLINICAL DATA:  LEFT side facial numbness beginning last night, recently diagnosed with lung cancer, planned chemotherapy to start today but was to week, past history of coronary artery disease post MI, hypertension, smoker  EXAM: CT HEAD WITHOUT CONTRAST  TECHNIQUE: Contiguous axial images were obtained from the base of the skull through the vertex without intravenous contrast.  COMPARISON:  None; correlation MRI brain  02/27/Parsons  FINDINGS: Normal ventricular morphology.  No midline shift or mass effect.  High attenuation subarachnoid hemorrhage identified within a sulcus in the RIGHT parietal lobe.  No intraparenchymal hemorrhage, definite mass lesion, or evidence acute infarction.  No additional extra-axial collections.  Atherosclerotic calcifications at the carotid siphons.  Bones unremarkable.  RIGHT ethmoid air cell opacification noted.  IMPRESSION: Acute subarachnoid hemorrhage within a  sulcus at the RIGHT parietal lobe.  No other intracranial abnormalities.  Critical Value/emergent results were called by telephone at the time of interpretation on 05/22/Parsons at 1722 hr to Dr. Quintella Reichert , who verbally acknowledged these results.   Electronically Signed   By: Lavonia Dana M.D.   On: 05/01/Parsons 17:23   Mr Jeri Cos VO Contrast  5/3/Parsons   CLINICAL DATA:  45 or old male with history of newly diagnosed left lung cancer, presented with sudden onset expressive aphasia, found have acute subarachnoid hemorrhage.  EXAM: MRI HEAD WITHOUT AND WITH CONTRAST  MRV HEAD WITHOUT CONTRAST  TECHNIQUE: Multiplanar, multiecho pulse sequences of the brain and surrounding structures were obtained without and with intravenous contrast. Angiographic images of the intracranial venous structures were obtained using MRV technique without intravenous contrast.  Multiplanar multi sequence images of the brain were performed with and without the administration of IV gadolinium. Additional dedicated MRV images were obtained.  CONTRAST:  41m MULTIHANCE GADOBENATE DIMEGLUMINE 529 MG/ML IV SOLN  COMPARISON:  Prior CT from 05/22/Parsons as well as MRI from 02/27/Parsons.  FINDINGS: Cerebral volume within normal limits for patient age. Minimal patchy T2/FLAIR hyperintensity present within the periventricular and deep white matter both cerebral hemispheres, likely related to very mild chronic small vessel ischemic changes.  Serpiginous hyperintense FLAIR signal intensity seen layering within cortical sulci within the posterior right frontal lobe is consistent with acute subarachnoid hemorrhage. Overall, this is grossly stable in volume and distribution as compared to previous CT. No new hemorrhage identified. There is associated restricted diffusion with this subarachnoid hemorrhage.  There is abnormal restricted diffusion involving the cortical gray matter of the left frontotemporal region, consistent with acute ischemic infarct  (series 3, image 21). This involves the posterior left insular cortex. No associated hemorrhage or significant mass effect. Additional small cortical infarct more posteriorly within the posterior left frontal region (series 3, image 23). Additional tiny cortical infarct more superiorly within knee postcentral gyrus of the left parietal lobe (series 3, image 37, 34).  Tiny focus of restricted diffusion adjacent to the atrium of the right lateral ventricle noted as well (series 3, image 24). Finding also Adam reflect a small acute ischemic infarct.  On post-contrast sequences, there is a tiny ring enhancing 4 mm lesion in the region of the right insula, compatible with a small intracranial metastasis (series 11, image 28). Additional 3 mm metastasis present within the cortical gray matter of the posterior left frontal parietal region (series 11, image 29). There is a tiny 2 mm metastasis within the left cerebellar hemisphere (series 11, image 9). Additional tiny 2 mm mass seen more superiorly within the left cerebellar hemisphere (series 11, image 13). No other definite intracranial metastases seen on post-contrast sequences. There are is a round 3 mm focus of restricted diffusion within mean right frontal lobe, seen on series 3, image 32, which Adam reflect a small/occult metastasis as well, as no definite subarachnoid hemorrhage or infarct is seen associated with this lesion. There is an additional 3 mm metastasis within the peripheral right frontal lobe (series 12, image 17).  This is seen in the region of the acute subarachnoid hemorrhage.  No midline shift or significant mass effect. No hydrocephalus. No extra-axial fluid collection.  Craniocervical junction within normal limits. Pituitary gland normal.  No acute abnormality about the orbits.  Scattered opacity present within the ethmoidal air cells, right greater than left. Small amount of mucosal thickening present within level left maxillary sinus. No mastoid  effusion. Inner ear structures are normal. Normal intravascular flow voids are maintained.  Bone marrow signal intensity within normal limits. No scalp soft tissue abnormality.  Dedicated MRV images demonstrate no filling defect to suggest venous sinus thrombosis. Superior sagittal sinus, transverse sinuses, and sigmoid sinuses are widely patent. Visualize internal jugular veins are widely patent. Deep venous structures patent.  IMPRESSION: 1. 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 No significant mass effect or associated hemorrhage. 2. Additional subcentimeter focus of restricted diffusion adjacent to the atrium of the right lateral ventricle, also suspicious for small acute ischemic infarct. 3. Stable volume and distribution of acute subarachnoid hemorrhage within the right frontal region as compared to prior CT. No new intracranial hemorrhage. 4. 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 One of these lesions lies in close proximity to the acute subarachnoid hemorrhage within the right frontal lobe, raising the suspicion that this is the source of bleeding. 5. Normal MRV of the brain.   Electronically Signed   By: Jeannine Boga M.D.   On: 05/03/Parsons 06:38   [name]  5/3/Parsons   CLINICAL DATA:  4 or old male with history of newly diagnosed left lung cancer, presented with sudden onset expressive aphasia, found have acute subarachnoid hemorrhage.  EXAM: MRI HEAD WITHOUT AND WITH CONTRAST  MRV HEAD WITHOUT CONTRAST  TECHNIQUE: Multiplanar, multiecho pulse sequences of the brain and surrounding structures were obtained without and with intravenous contrast. Angiographic images of the intracranial venous structures were obtained using MRV technique without intravenous contrast.  Multiplanar multi  sequence images of the brain were performed with and without the administration of IV gadolinium. Additional dedicated MRV images were obtained.  CONTRAST:  23m MULTIHANCE GADOBENATE DIMEGLUMINE 529 MG/ML IV SOLN  COMPARISON:  Prior CT from 05/25/Parsons as well as MRI from 02/27/Parsons.  FINDINGS: Cerebral volume within normal limits for patient age. Minimal patchy T2/FLAIR hyperintensity present within the periventricular and deep white matter both cerebral hemispheres, likely related to very mild chronic small vessel ischemic changes.  Serpiginous hyperintense FLAIR signal intensity seen layering within cortical sulci within the posterior right frontal lobe is consistent with acute subarachnoid hemorrhage. Overall, this is grossly stable in volume and distribution as compared to previous CT. No new hemorrhage identified. There is associated restricted diffusion with this subarachnoid hemorrhage.  There is abnormal restricted diffusion involving the cortical gray matter of the left frontotemporal region, consistent with acute ischemic infarct (series 3, image 21). This involves the posterior left insular cortex. No associated hemorrhage or significant mass effect. Additional small cortical infarct more posteriorly within the posterior left frontal region (series 3, image 23). Additional tiny cortical infarct more superiorly within knee postcentral gyrus of the left parietal lobe (series 3, image 37, 34).  Tiny focus of restricted diffusion adjacent to the atrium of the right lateral ventricle noted as well (series 3, image 24). Finding also Adam reflect a small acute ischemic infarct.  On post-contrast sequences,  there is a tiny ring enhancing 4 mm lesion in the region of the right insula, compatible with a small intracranial metastasis (series 11, image 28). Additional 3 mm metastasis present within the cortical gray matter of the posterior left frontal parietal region (series 11, image 29). There is a tiny 2 mm  metastasis within the left cerebellar hemisphere (series 11, image 9). Additional tiny 2 mm mass seen more superiorly within the left cerebellar hemisphere (series 11, image 13). No other definite intracranial metastases seen on post-contrast sequences. There are is a round 3 mm focus of restricted diffusion within mean right frontal lobe, seen on series 3, image 32, which Adam reflect a small/occult metastasis as well, as no definite subarachnoid hemorrhage or infarct is seen associated with this lesion. There is an additional 3 mm metastasis within the peripheral right frontal lobe (series 12, image 17). This is seen in the region of the acute subarachnoid hemorrhage.  No midline shift or significant mass effect. No hydrocephalus. No extra-axial fluid collection.  Craniocervical junction within normal limits. Pituitary gland normal.  No acute abnormality about the orbits.  Scattered opacity present within the ethmoidal air cells, right greater than left. Small amount of mucosal thickening present within level left maxillary sinus. No mastoid effusion. Inner ear structures are normal. Normal intravascular flow voids are maintained.  Bone marrow signal intensity within normal limits. No scalp soft tissue abnormality.  Dedicated MRV images demonstrate no filling defect to suggest venous sinus thrombosis. Superior sagittal sinus, transverse sinuses, and sigmoid sinuses are widely patent. Visualize internal jugular veins are widely patent. Deep venous structures patent.  IMPRESSION: 1. 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 No significant mass effect or associated hemorrhage. 2. Additional subcentimeter focus of restricted diffusion adjacent to the atrium of the right lateral ventricle, also suspicious for small acute ischemic infarct. 3. Stable volume and distribution of acute subarachnoid hemorrhage within the right frontal region as compared to prior CT.  No new intracranial hemorrhage. 4. 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 One of these lesions lies in close proximity to the acute subarachnoid hemorrhage within the right frontal lobe, raising the suspicion that this is the source of bleeding. 5. Normal MRV of the brain.   Electronically Signed   By: Jeannine Boga M.D.   On: 05/03/Parsons 06:38   Ir Ivc Filter Plmt / S&i /img Guid/mod Sed  5/3/Parsons   CLINICAL DATA:  Pulmonary embolus, subarachnoid hemorrhage, patient can no longer be anticoagulated  EXAM: ULTRASOUND GUIDANCE FOR VASCULAR ACCESS  IVC CATHETERIZATION AND VENOGRAM  IVC FILTER INSERTION  Date:  5/3/20165/3/Parsons 12:12 pm  Radiologist:  M. Daryll Brod, MD  Guidance:  Ultrasound and fluoroscopic  FLUOROSCOPY TIME:  1 minutes 6 seconds, 25.55 mGy  MEDICATIONS AND MEDICAL HISTORY: 1 mg Versed, 50 mcg fentanyl  ANESTHESIA/SEDATION: 10 minutes  CONTRAST:  58m OMNIPAQUE IOHEXOL 300 MG/ML  SOLN  COMPLICATIONS: None immediate  PROCEDURE: Informed consent was obtained from the patient following explanation of the procedure, risks, benefits and alternatives. The patient understands, agrees and consents for the procedure. All questions were addressed. A time out was performed.  Maximal barrier sterile technique utilized including caps, mask, sterile gowns, sterile gloves, large sterile drape, hand hygiene, and betadine prep.  Under sterile condition and local anesthesia, right internal jugular venous access was performed with ultrasound. Over  a guide wire, the IVC filter delivery sheath and inner dilator were advanced into the IVC just above the IVC bifurcation. Contrast injection was performed for an IVC venogram.  IVC VENOGRAM: The IVC is patent. No evidence of thrombus, stenosis, or occlusion. No variant venous anatomy. The renal veins are identified at L1-2.  IVC FILTER  INSERTION: Through the delivery sheath, the Bard Denali IVC filter was deployed in the infrarenal IVC at the L2-3 level just below the renal veins and above the IVC bifurcation. Contrast injection confirmed position. There is good apposition of the filter against the IVC.  The delivery sheath was removed and hemostasis was obtained with compression for 5 minutes. The patient tolerated the procedure well. No immediate complications.  IMPRESSION: Ultrasound and fluoroscopically guided infrarenal IVC filter insertion.   Electronically Signed   By: Jerilynn Mages.  Shick M.D.   On: 05/03/Parsons 13:00   Dg Chest Port 1 View  5/3/Parsons   CLINICAL DATA:  Hypertension.  Evaluate for metastatic disease.  EXAM: PORTABLE CHEST - 1 VIEW  COMPARISON:  05/15/Parsons.  PET-CT 03/08/Parsons.  CT chest 02/26/Parsons.  FINDINGS: Stable changes of mediastinal and left hilar adenopathy. Persistent ill-defined density left upper lobe. No pleural effusion or pneumothorax. Heart size stable. No pneumothorax. No acute osseus abnormality.  IMPRESSION: 1. Persistent mediastinal and left hilar adenopathy. 2. Persistent ill-defined density left upper lobe . No new lesions identified.   Electronically Signed   By: Marcello Moores  Register   On: 05/03/Parsons 07:20   Dg Abd Portable 1v  5/3/Parsons   CLINICAL DATA:  54 year old male with vomiting and nausea  EXAM: PORTABLE ABDOMEN - 1 VIEW  COMPARISON:  PET-CT 03/08/Parsons  FINDINGS: Normal, nonobstructed bowel gas pattern. No evidence organomegaly or suspicious calcification. Bard Montgomery potentially retrievable IVC filter in the expected location.  IMPRESSION: Negative.  Potentially retrievable IVC filter in the expected position.   Electronically Signed   By: Jacqulynn Cadet M.D.   On: 05/03/Parsons 18:58    Assessment/Plan: Patient remains neurologically stable. For 3T SRS protocol MRI without and with gadolinium tomorrow. Unclear what is causing left shoulder pain, we'll defer further workup to CCM. I do wonder whether  could be related to the underlying malignancy in the left lung or whether there is an intrinsic left shoulder problem that would require orthopedic evaluation.  Spoke with the patient and his wife at length at the bedside, answered their questions regarding her current status as well as plans for further evaluation and care.   Hosie Spangle, MD 5/4/Parsons, 9:33 AM

## 2014-11-28 NOTE — Progress Notes (Signed)
[facility] Progress Note Patient Name: [name] DOB: 1960/10/10 MRN: [id number]   Date of Service  11/28/2014  HPI/Events of Note  Failed pcxa  eICU Interventions  Dc pca Start dilauded     Intervention Category Minor Interventions: Routine modifications to care plan (e.g. PRN medications for pain, fever)  Raylene Miyamoto. 11/28/2014, 7:03 PM

## 2014-11-28 NOTE — Progress Notes (Signed)
0640 pt still c/o left shoulder pain. MD notified.

## 2014-11-28 NOTE — Progress Notes (Signed)
0540 pt c/o 10-10 left shoulder pain. 75 mcg of fentanyl given, 1 mg ativan given. 6945 pt still c/o 10-10 shoulder pain and nauseated. Additional one time dose of 75 mcg of fentanyl and zofran given.

## 2014-11-28 NOTE — Progress Notes (Addendum)
PULMONARY / CRITICAL CARE MEDICINE   Name: [name] MRN: [id number] DOB: September 02, 1960    ADMISSION DATE:  12/04/2014 CONSULTATION DATE:  11/28/2014  REFERRING MD :  EDP  CHIEF COMPLAINT:  Speech difficulties  INITIAL PRESENTATION:   [name] [name] is a 54 y.o. M with PMH as outlined below including recent diagnosis of lung CA (diagnosed through biopsy via EBUS and mediastinoscopy (11/05/14 - Dr. Dorris Fetch), as well as PE diagnosed in Feb 2016 (on lovenox).  Back in February, pt noted swelling of his LE's and was awaiting venous doppler; however, went on to develop headache and SOB as well as left shoulder pain.  He was seen in ED where he hat CTA done on 09/21/14.  This revealed acute PE in RUL as well as a couple of pulmonary nodules concerning for malignancy.  MRI brain performed 2/27 was negative for mets to the brain.  PET scan performed 10/22/14 showed hypermetabolic lesions in mediastinum, paratracheal, sub-carinal, and hilar lymph nodes with left lung cancer.  This is what prompted referral to Dr. Dorris Fetch followed by EBUS / mediastinoscopy.  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  He was scheduled for initiation of chemoradiation on 12/07/2014.  Upon arrival to cancer center that afternoon, he had acute speech difficulties.  He was sent to the ED for further evaluation.  CT of the brain revealed a small amount of sulcal SAH over the right hemisphere.  He was subsequently transferred to [facility] for further evaluation.  He was seen in consultation by neurosurgery (Dr. Newell Coral) and it was felt that his spontaneous SAH was most likely related to his antiplatelet therapy and anticoagulation given that it was not in the typical distribution of a aneurysmal SAH.  They also did recommend repeating brain MRI given that this could be related to brain mets or paraneoplastic syndrome.  Antiplatelets and anticoagulation were stopped and reversed (50mg  protamine,  PCCM was  consulted for medical management.   STUDIES:  CT head 5/2 >>> acute SAH within sulcus at the right parietal lobe. CXR 5/2 >>> unchanged LUL spiculated nodule, right peritracheal and left hilar adenopathy. 5/2 - admitted to [facility] ICU for small right [facility] 11/27/14: Somewhat anxious but in good spirits given his circumstances. S/p IVC filter +    SUBJECTIVE/OVERNIGHT/INTERVAL HX 11/28/14: seen by Rad Onc- KPS 80 determined . Repeat Brain MRI with 3 x < 1cm brain met. Rx Radiosurgery palanned. SAH felt unrelated. Per RN headache desiite fentanyl prn is the maiun bedside complaint. Neuro intact per RN.   C/o left shoulder pain since admission - sever, worsening, fentanyl needed always and still breaking through, no radiation, deep gnawing pain, no difficulty movement  VITAL SIGNS: Temp:  [97.7 F (36.5 C)-98.5 F (36.9 C)] 98.1 F (36.7 C) (05/04 0727) Pulse Rate:  [78-106] 87 (05/04 0800) Resp:  [10-25] 11 (05/04 0800) BP: (100-166)/(61-100) 147/75 mmHg (05/04 0800) SpO2:  [90 %-100 %] 94 % (05/04 0800) Weight:  [71.9 kg (158 lb 8.2 oz)] 71.9 kg (158 lb 8.2 oz) (05/04 0600) HEMODYNAMICS:   VENTILATOR SETTINGS:   INTAKE / OUTPUT: Intake/Output      05/03 0701 - 05/04 0700 05/04 0701 - 05/05 0700   P.O. 700    Total Intake(mL/kg) 700 (9.7)    Urine (mL/kg/hr) 925 (0.5)    Emesis/NG output 0 (0)    Total Output 925     Net -225          Emesis Occurrence 4 x  PHYSICAL EXAMINATION: General: Pleasant adult male, in NAD. Deconditooned Neuro: A&O x 3, non-focal.  HEENT: [place]/AT. PERRL, sclerae anicteric. Cardiovascular: RRR, no M/R/G.  Lungs: Respirations even and unlabored.  CTA bilaterally, No W/R/R. Abdomen: BS x 4, soft, NT/ND.  Musculoskeletal: No gross deformities, no edema. Left shoulder range of motion normal. No swelling.  Skin: Intact, warm, no rashes.  LABS:  PULMONARY No results for input(s): PHART, PCO2ART, PO2ART, HCO3, TCO2, O2SAT in the last 168  hours.  Invalid input(s): PCO2, PO2  CBC  Recent Labs Lab 12/02/2014 1535 11/27/14 1455 11/28/14 0235  HGB 8.8* 7.8* 8.0*  HCT 26.8* 23.5* 23.6*  WBC 19.2* 16.1* 18.5*  PLT 474* 382 376    COAGULATION  Recent Labs Lab 12/24/2014 1535  INR 1.10    CARDIAC   Recent Labs Lab 11/27/14 0045 11/27/14 0602 11/27/14 1228  TROPONINI 0.32* 0.62* 1.07*   No results for input(s): PROBNP in the last 168 hours.   CHEMISTRY  Recent Labs Lab 12/08/2014 1236  12/17/2014 1535 11/27/14 1228 11/28/14 0235  NA 140  --  140 135 133*  K 4.2  < > 3.9 3.7 4.2  CL  --   --  106 102 100*  CO2 23  --  24 22 22   GLUCOSE 100  --  103* 115* 143*  BUN 20.8  --  24* 17 15  CREATININE 0.8  --  0.85 0.84 0.82  CALCIUM 9.7  --  9.8 9.1 9.4  MG  --   --   --  1.8 1.9  PHOS  --   --   --  3.2 4.2  < > = values in this interval not displayed. Estimated Creatinine Clearance: 105.4 mL/min (by C-G formula based on Cr of 0.82).   LIVER  Recent Labs Lab 12/10/2014 1236 12/12/2014 1535  AST 24 31  ALT 40 44  ALKPHOS 108 111  BILITOT <0.20 0.2*  PROT 7.2 7.8  ALBUMIN 3.6 4.3  INR  --  1.10     INFECTIOUS No results for input(s): LATICACIDVEN, PROCALCITON in the last 168 hours.   ENDOCRINE CBG (last 3)  No results for input(s): GLUCAP in the last 72 hours.       IMAGING x48h Dg Chest 2 View  11/29/2014   CLINICAL DATA:  Numbness. Dysphagia. LEFT-sided numbness. History of cardiac problems.  EXAM: CHEST  2 VIEW  COMPARISON:  None.  FINDINGS: Spiculated LEFT upper lobe mass appears similar to PET-CT 09/21/2014. Fullness of the RIGHT peritracheal soft tissues most compatible with lymphadenopathy. This is a new finding compared to 09/21/2014. There is no airspace disease. No pleural effusion. Cardiac stent noted. Monitoring leads project over the chest. Cardiopericardial silhouette within normal limits.  IMPRESSION: 1. Unchanged LEFT upper lobe spiculated pulmonary nodule. 2. RIGHT  peritracheal and LEFT hilar adenopathy.   Electronically Signed   By: 09/23/2014 M.D.   On: 12/10/2014 15:50   Dg Shoulder 1v Left  11/27/2014   CLINICAL DATA:  54 year old male with left shoulder pain. Known stage IIIB lung cancer. Evaluate for metastasis.  EXAM: LEFT SHOULDER - 1 VIEW  COMPARISON:  Prior PET-CT 10/02/2014  FINDINGS: There is no evidence of fracture or dislocation. There is no evidence of arthropathy or other focal bone abnormality. No lytic or blastic osseous lesion. Soft tissues are unremarkable.  IMPRESSION: Negative.   Electronically Signed   By: 12/02/2014 M.D.   On: 11/27/2014 18:17   Ct Head Wo Contrast  12/23/2014  CLINICAL DATA:  LEFT side facial numbness beginning last night, recently diagnosed with lung cancer, planned chemotherapy to start today but was to week, past history of coronary artery disease post MI, hypertension, smoker  EXAM: CT HEAD WITHOUT CONTRAST  TECHNIQUE: Contiguous axial images were obtained from the base of the skull through the vertex without intravenous contrast.  COMPARISON:  None; correlation MRI brain 09/22/2014  FINDINGS: Normal ventricular morphology.  No midline shift or mass effect.  High attenuation subarachnoid hemorrhage identified within a sulcus in the RIGHT parietal lobe.  No intraparenchymal hemorrhage, definite mass lesion, or evidence acute infarction.  No additional extra-axial collections.  Atherosclerotic calcifications at the carotid siphons.  Bones unremarkable.  RIGHT ethmoid air cell opacification noted.  IMPRESSION: Acute subarachnoid hemorrhage within a sulcus at the RIGHT parietal lobe.  No other intracranial abnormalities.  Critical Value/emergent results were called by telephone at the time of interpretation on 12/19/2014 at 1722 hr to Dr. Quintella Reichert , who verbally acknowledged these results.   Electronically Signed   By: Lavonia Dana M.D.   On: 12/09/2014 17:23   Mr Jeri Cos NZ Contrast  11/27/2014   CLINICAL DATA:   21 or old male with history of newly diagnosed left lung cancer, presented with sudden onset expressive aphasia, found have acute subarachnoid hemorrhage.  EXAM: MRI HEAD WITHOUT AND WITH CONTRAST  MRV HEAD WITHOUT CONTRAST  TECHNIQUE: Multiplanar, multiecho pulse sequences of the brain and surrounding structures were obtained without and with intravenous contrast. Angiographic images of the intracranial venous structures were obtained using MRV technique without intravenous contrast.  Multiplanar multi sequence images of the brain were performed with and without the administration of IV gadolinium. Additional dedicated MRV images were obtained.  CONTRAST:  42mL MULTIHANCE GADOBENATE DIMEGLUMINE 529 MG/ML IV SOLN  COMPARISON:  Prior CT from 12/18/2014 as well as MRI from 09/22/2014.  FINDINGS: Cerebral volume within normal limits for patient age. Minimal patchy T2/FLAIR hyperintensity present within the periventricular and deep white matter both cerebral hemispheres, likely related to very mild chronic small vessel ischemic changes.  Serpiginous hyperintense FLAIR signal intensity seen layering within cortical sulci within the posterior right frontal lobe is consistent with acute subarachnoid hemorrhage. Overall, this is grossly stable in volume and distribution as compared to previous CT. No new hemorrhage identified. There is associated restricted diffusion with this subarachnoid hemorrhage.  There is abnormal restricted diffusion involving the cortical gray matter of the left frontotemporal region, consistent with acute ischemic infarct (series 3, image 21). This involves the posterior left insular cortex. No associated hemorrhage or significant mass effect. Additional small cortical infarct more posteriorly within the posterior left frontal region (series 3, image 23). Additional tiny cortical infarct more superiorly within knee postcentral gyrus of the left parietal lobe (series 3, image 37, 34).  Tiny  focus of restricted diffusion adjacent to the atrium of the right lateral ventricle noted as well (series 3, image 24). Finding also may reflect a small acute ischemic infarct.  On post-contrast sequences, there is a tiny ring enhancing 4 mm lesion in the region of the right insula, compatible with a small intracranial metastasis (series 11, image 28). Additional 3 mm metastasis present within the cortical gray matter of the posterior left frontal parietal region (series 11, image 29). There is a tiny 2 mm metastasis within the left cerebellar hemisphere (series 11, image 9). Additional tiny 2 mm mass seen more superiorly within the left cerebellar hemisphere (series 11, image 13). No other definite  intracranial metastases seen on post-contrast sequences. There are is a round 3 mm focus of restricted diffusion within mean right frontal lobe, seen on series 3, image 32, which may reflect a small/occult metastasis as well, as no definite subarachnoid hemorrhage or infarct is seen associated with this lesion. There is an additional 3 mm metastasis within the peripheral right frontal lobe (series 12, image 17). This is seen in the region of the acute subarachnoid hemorrhage.  No midline shift or significant mass effect. No hydrocephalus. No extra-axial fluid collection.  Craniocervical junction within normal limits. Pituitary gland normal.  No acute abnormality about the orbits.  Scattered opacity present within the ethmoidal air cells, right greater than left. Small amount of mucosal thickening present within level left maxillary sinus. No mastoid effusion. Inner ear structures are normal. Normal intravascular flow voids are maintained.  Bone marrow signal intensity within normal limits. No scalp soft tissue abnormality.  Dedicated MRV images demonstrate no filling defect to suggest venous sinus thrombosis. Superior sagittal sinus, transverse sinuses, and sigmoid sinuses are widely patent. Visualize internal jugular  veins are widely patent. Deep venous structures patent.  IMPRESSION: 1. 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 No significant mass effect or associated hemorrhage. 2. Additional subcentimeter focus of restricted diffusion adjacent to the atrium of the right lateral ventricle, also suspicious for small acute ischemic infarct. 3. Stable volume and distribution of acute subarachnoid hemorrhage within the right frontal region as compared to prior CT. No new intracranial hemorrhage. 4. 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 One of these lesions lies in close proximity to the acute subarachnoid hemorrhage within the right frontal lobe, raising the suspicion that this is the source of bleeding. 5. Normal MRV of the brain.   Electronically Signed   By: Jeannine Boga M.D.   On: 11/27/2014 06:38   [name]  11/27/2014   CLINICAL DATA:  37 or old male with history of newly diagnosed left lung cancer, presented with sudden onset expressive aphasia, found have acute subarachnoid hemorrhage.  EXAM: MRI HEAD WITHOUT AND WITH CONTRAST  MRV HEAD WITHOUT CONTRAST  TECHNIQUE: Multiplanar, multiecho pulse sequences of the brain and surrounding structures were obtained without and with intravenous contrast. Angiographic images of the intracranial venous structures were obtained using MRV technique without intravenous contrast.  Multiplanar multi sequence images of the brain were performed with and without the administration of IV gadolinium. Additional dedicated MRV images were obtained.  CONTRAST:  69mL MULTIHANCE GADOBENATE DIMEGLUMINE 529 MG/ML IV SOLN  COMPARISON:  Prior CT from 12/09/2014 as well as MRI from 09/22/2014.  FINDINGS: Cerebral volume within normal limits for patient age. Minimal patchy T2/FLAIR  hyperintensity present within the periventricular and deep white matter both cerebral hemispheres, likely related to very mild chronic small vessel ischemic changes.  Serpiginous hyperintense FLAIR signal intensity seen layering within cortical sulci within the posterior right frontal lobe is consistent with acute subarachnoid hemorrhage. Overall, this is grossly stable in volume and distribution as compared to previous CT. No new hemorrhage identified. There is associated restricted diffusion with this subarachnoid hemorrhage.  There is abnormal restricted diffusion involving the cortical gray matter of the left frontotemporal region, consistent with acute ischemic infarct (series 3, image 21). This involves the posterior left insular cortex. No associated hemorrhage or significant mass effect. Additional small cortical infarct more  posteriorly within the posterior left frontal region (series 3, image 23). Additional tiny cortical infarct more superiorly within knee postcentral gyrus of the left parietal lobe (series 3, image 37, 34).  Tiny focus of restricted diffusion adjacent to the atrium of the right lateral ventricle noted as well (series 3, image 24). Finding also may reflect a small acute ischemic infarct.  On post-contrast sequences, there is a tiny ring enhancing 4 mm lesion in the region of the right insula, compatible with a small intracranial metastasis (series 11, image 28). Additional 3 mm metastasis present within the cortical gray matter of the posterior left frontal parietal region (series 11, image 29). There is a tiny 2 mm metastasis within the left cerebellar hemisphere (series 11, image 9). Additional tiny 2 mm mass seen more superiorly within the left cerebellar hemisphere (series 11, image 13). No other definite intracranial metastases seen on post-contrast sequences. There are is a round 3 mm focus of restricted diffusion within mean right frontal lobe, seen on series 3, image 32, which may  reflect a small/occult metastasis as well, as no definite subarachnoid hemorrhage or infarct is seen associated with this lesion. There is an additional 3 mm metastasis within the peripheral right frontal lobe (series 12, image 17). This is seen in the region of the acute subarachnoid hemorrhage.  No midline shift or significant mass effect. No hydrocephalus. No extra-axial fluid collection.  Craniocervical junction within normal limits. Pituitary gland normal.  No acute abnormality about the orbits.  Scattered opacity present within the ethmoidal air cells, right greater than left. Small amount of mucosal thickening present within level left maxillary sinus. No mastoid effusion. Inner ear structures are normal. Normal intravascular flow voids are maintained.  Bone marrow signal intensity within normal limits. No scalp soft tissue abnormality.  Dedicated MRV images demonstrate no filling defect to suggest venous sinus thrombosis. Superior sagittal sinus, transverse sinuses, and sigmoid sinuses are widely patent. Visualize internal jugular veins are widely patent. Deep venous structures patent.  IMPRESSION: 1. 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 No significant mass effect or associated hemorrhage. 2. Additional subcentimeter focus of restricted diffusion adjacent to the atrium of the right lateral ventricle, also suspicious for small acute ischemic infarct. 3. Stable volume and distribution of acute subarachnoid hemorrhage within the right frontal region as compared to prior CT. No new intracranial hemorrhage. 4. 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 One of these lesions lies in close proximity to the acute subarachnoid hemorrhage within the right frontal lobe, raising the suspicion that this is the source of bleeding. 5.  Normal MRV of the brain.   Electronically Signed   By: Jeannine Boga M.D.   On: 11/27/2014 06:38   Ir Ivc Filter Plmt / S&i /img Guid/mod Sed  11/27/2014   CLINICAL DATA:  Pulmonary embolus, subarachnoid hemorrhage, patient can no longer be anticoagulated  EXAM: ULTRASOUND GUIDANCE FOR VASCULAR ACCESS  IVC CATHETERIZATION AND VENOGRAM  IVC FILTER INSERTION  Date:  5/3/20165/09/2014 12:12 pm  Radiologist:  M. Daryll Brod, MD  Guidance:  Ultrasound and fluoroscopic  FLUOROSCOPY TIME:  1 minutes 6 seconds, 25.55 mGy  MEDICATIONS AND MEDICAL HISTORY: 1 mg Versed, 50 mcg fentanyl  ANESTHESIA/SEDATION: 10 minutes  CONTRAST:  12mL OMNIPAQUE IOHEXOL 300 MG/ML  SOLN  COMPLICATIONS: None immediate  PROCEDURE: Informed consent was obtained from the  patient following explanation of the procedure, risks, benefits and alternatives. The patient understands, agrees and consents for the procedure. All questions were addressed. A time out was performed.  Maximal barrier sterile technique utilized including caps, mask, sterile gowns, sterile gloves, large sterile drape, hand hygiene, and betadine prep.  Under sterile condition and local anesthesia, right internal jugular venous access was performed with ultrasound. Over a guide wire, the IVC filter delivery sheath and inner dilator were advanced into the IVC just above the IVC bifurcation. Contrast injection was performed for an IVC venogram.  IVC VENOGRAM: The IVC is patent. No evidence of thrombus, stenosis, or occlusion. No variant venous anatomy. The renal veins are identified at L1-2.  IVC FILTER INSERTION: Through the delivery sheath, the Bard Denali IVC filter was deployed in the infrarenal IVC at the L2-3 level just below the renal veins and above the IVC bifurcation. Contrast injection confirmed position. There is good apposition of the filter against the IVC.  The delivery sheath was removed and hemostasis was obtained with compression for 5 minutes. The patient  tolerated the procedure well. No immediate complications.  IMPRESSION: Ultrasound and fluoroscopically guided infrarenal IVC filter insertion.   Electronically Signed   By: Jerilynn Mages.  Shick M.D.   On: 11/27/2014 13:00   Dg Chest Port 1 View  11/27/2014   CLINICAL DATA:  Hypertension.  Evaluate for metastatic disease.  EXAM: PORTABLE CHEST - 1 VIEW  COMPARISON:  12/02/2014.  PET-CT 10/02/2014.  CT chest 09/21/2014.  FINDINGS: Stable changes of mediastinal and left hilar adenopathy. Persistent ill-defined density left upper lobe. No pleural effusion or pneumothorax. Heart size stable. No pneumothorax. No acute osseus abnormality.  IMPRESSION: 1. Persistent mediastinal and left hilar adenopathy. 2. Persistent ill-defined density left upper lobe . No new lesions identified.   Electronically Signed   By: Marcello Moores  Register   On: 11/27/2014 07:20   Dg Abd Portable 1v  11/27/2014   CLINICAL DATA:  54 year old male with vomiting and nausea  EXAM: PORTABLE ABDOMEN - 1 VIEW  COMPARISON:  PET-CT 10/02/2014  FINDINGS: Normal, nonobstructed bowel gas pattern. No evidence organomegaly or suspicious calcification. Bard Colwyn potentially retrievable IVC filter in the expected location.  IMPRESSION: Negative.  Potentially retrievable IVC filter in the expected position.   Electronically Signed   By: Jacqulynn Cadet M.D.   On: 11/27/2014 18:58        ASSESSMENT / PLAN:  NEUROLOGIC A:   Acute SAH  - likely due to antiplatelets and anticoagulation;  MRI reveals 3 mets  .- Rad onc 11/27/14 does not feel met and SAH related  - Neurosurgery does not feel patient is a surgical candidate.  Neuro intact 11/28/14  P:   Neurosurgery and neurology following, appreciate input. Repeat  MRI on 11/28/2014: , if bleed is stable will start aspirin and if remains stable in a few weeks may start oral anti-coag. Neurosurg recommends radiation therapy as outpatient. Continue outpatient lorazepam PRN.  PULMONARY A: Recently diagnosed  adenocarcinoma stage 4- was scheduled to start chemoradiation 11/29/2014 Known PE - on lovenox Former smoker   - not in distress. Seen by rad-onc - recommended radiosurgery. S/o IVC filter yesterda 11/27/14 P:   Dr. Inda Merlin notified. Lovenox d/c'd given [facility]  Anticoag plan as above.  CARDIOVASCULAR A:  Hx HTN, HLD, MI, CAD s/p DES 2006 - on plavix and aspirin Elevated troponin ().19 > 0.32) - initial EKG reassuring NSTEMI  - echo done result pending P:  Plavix and ASA d/c'd given  SAH. Continue outpatient lopressor, atorvastatin. Troponin positive but will not be able to start anticoag until as planned above.. Heparin contraindicated given SAH. Await echo result 11/28/14 ASA by Thursday 5/55/16 assuming head CT is negative. Beta blockers ordered with holding parameters. Hold cards consult for now.  RENAL A:   No acute issues but mild low mag P:   repelete mag Will order BMET now and in AM. Replace electrolytes as indicated.  GASTROINTESTINAL A:   Nutrition P:   diet.[name]  HEMATOLOGIC/Oncology A:   Recently diagnosed lung CA Anemia VTE Prophylaxis  - rad onc planning radiosurgery   P:  Transfuse for Hgb < 7. SCD's only. CBC in AM. seee rad onc notes 11/27/14  INFECTIOUS A:   Leukocytosis - no indication of infection, likely represents acute phase reactant P:   Monitor clinically. Hold abx.  ENDOCRINE A:   No acute issues P:   No interventions required.   GEN A: severe shuolder pain P MRI rule out mets and shoulder issues - w/wo contrast Change RN directed fent  To fent pca  Family updated: Patient and family updated bedside.     Dr. Brand Males, M.D., [facility].C.P Pulmonary and Critical Care Medicine Staff Physician [place] [facility] Pager: [phone], If no answer or between  15:00h - 7:00h: call [phone]  11/28/2014 9:55 AM

## 2014-11-28 NOTE — Progress Notes (Signed)
STROKE TEAM PROGRESS NOTE   HISTORY [name] is an 54 y.o. male with a past medical history significant for HTN, hyperlipidemia, CAD s/p stenting, MI, smoking, newly diagnosed left lung cancer on 2/16, right PE, transferred to [facility] for further evaluation and management of right sulcal SAH. Patient indicated that he was at the [place] center yesterday, about to initiate his chemotherapy, but after going to the bathroom he developed sudden onset of difficulty expressing himself that he describes as " knowing what I wanted to say but couldn't get anything out". The episode lasted for at least 10 minutes and he recalls that he also had some numbness-tingling around the left side of his mouth that then travelled to the left hand. No associated HA, vertigo, double vision, confusion, focal weakness, or vision impairment. Of note, he tells me that the day before he had " numbness and tingling of the left lower face and left arm lasting for 10 or 15 minutes". His LKW was 11/29/2014 at 1400. Patient was sent to [facility] where he had a CT brain that I reviewed myself and demonstrated a small acute subarachnoid hemorrhage within a sulcus at the RIGHT parietal lobe. [name] has been on a combination of aspirin and plavix since previous MI and Lovenox 100 mg was added this past February for PE treatment. Patient was not administered TPA secondary to [facility]. He was admitted to the neuro ICU for further evaluation and treatment.   SUBJECTIVE (INTERVAL HISTORY) His wife is at the bedside.  Overall he feels his condition is stable. I spoke to Dr Burt Knack y`day and he agreed with starting back aspirin 81 alone and not plavix   OBJECTIVE Temp:  [97.7 F (36.5 C)-98.5 F (36.9 C)] 98.1 F (36.7 C) (05/04 1108) Pulse Rate:  [78-105] 80 (05/04 1300) Cardiac Rhythm:  [-] Normal sinus rhythm (05/04 0800) Resp:  [11-25] 12 (05/04 1300) BP: (100-166)/(61-100) 138/69 mmHg (05/04 1300) SpO2:  [90 %-100 %] 99 % (05/04  1300) Weight:  [158 lb 8.2 oz (71.9 kg)] 158 lb 8.2 oz (71.9 kg) (05/04 0600)  No results for input(s): GLUCAP in the last 168 hours.  Recent Labs Lab 11/25/2014 1236 12/12/2014 1535 11/27/14 1228 11/28/14 0235  NA 140 140 135 133*  K 4.2 3.9 3.7 4.2  CL  --  106 102 100*  CO2 '23 24 22 22  '$ GLUCOSE 100 103* 115* 143*  BUN 20.8 24* 17 15  CREATININE 0.8 0.85 0.84 0.82  CALCIUM 9.7 9.8 9.1 9.4  MG  --   --  1.8 1.9  PHOS  --   --  3.2 4.2    Recent Labs Lab 11/27/2014 1236 11/25/2014 1535  AST 24 31  ALT 40 44  ALKPHOS 108 111  BILITOT <0.20 0.2*  PROT 7.2 7.8  ALBUMIN 3.6 4.3    Recent Labs Lab 11/25/2014 1236 12/07/2014 1535 11/27/14 1455 11/28/14 0235  WBC 21.8* 19.2* 16.1* 18.5*  NEUTROABS 16.1* 13.4*  --   --   HGB 8.6* 8.8* 7.8* 8.0*  HCT 26.2* 26.8* 23.5* 23.6*  MCV 92.0 95.4 92.2 91.5  PLT 448* 474* 382 376    Recent Labs Lab 11/27/14 0045 11/27/14 0602 11/27/14 1228  TROPONINI 0.32* 0.62* 1.07*    Recent Labs  11/25/2014 1535  LABPROT 14.3  INR 1.10    Recent Labs  12/23/2014 [place]  LABSPEC 1.024  PHURINE 6.0  [place] NEGATIVE  BILIRUBINUR NEGATIVE  KETONESUR  NEGATIVE  PROTEINUR NEGATIVE  UROBILINOGEN 0.2  NITRITE NEGATIVE  LEUKOCYTESUR NEGATIVE       Component Value Date/Time   CHOL 172 11/28/2014 0235   TRIG 166* 11/28/2014 0235   HDL 32* 11/28/2014 0235   CHOLHDL 5.4 11/28/2014 0235   VLDL 33 11/28/2014 0235   LDLCALC 107* 11/28/2014 0235   No results found for: HGBA1C No results found for: LABOPIA, COCAINSCRNUR, LABBENZ, AMPHETMU, THCU, LABBARB   Recent Labs Lab 12/19/2014 [place] <5    Dg Chest 2 View 11/27/2014    1. Persistent mediastinal and left hilar adenopathy. 2. Persistent ill-defined density left upper lobe . No new lesions identified. 12/12/2014    1. Unchanged LEFT upper lobe spiculated pulmonary nodule. 2. RIGHT peritracheal and LEFT hilar adenopathy.     Ct Head Wo  Contrast 11/30/2014   Acute subarachnoid hemorrhage within a sulcus at the RIGHT parietal lobe.  No other intracranial abnormalities.    [name] Contrast 11/27/2014    1. 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 No significant mass effect or associated hemorrhage. 2. Additional subcentimeter focus of restricted diffusion adjacent to the atrium of the right lateral ventricle, also suspicious for small acute ischemic infarct. 3. Stable volume and distribution of acute subarachnoid hemorrhage within the right frontal region as compared to prior CT. No new intracranial hemorrhage. 4. 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 One of these lesions lies in close proximity to the acute subarachnoid hemorrhage within the right frontal lobe, raising the suspicion that this is the source of bleeding.   [name] 11/27/2014     5. Normal MRV of the brain.   PHYSICAL EXAM  . Afebrile. Head is nontraumatic. Neck is supple without bruit.    Cardiac exam no murmur or gallop. Lungs are clear to auscultation. Distal pulses are well felt.  Neurological Exam ;  Awake  Alert oriented x 3. Normal speech and language.eye movements full without nystagmus.fundi were not visualized. Vision acuity and fields appear normal. Hearing is normal. Palatal movements are normal. Face symmetric. Tongue midline. Normal strength, tone, reflexes and coordination. Normal sensation. Gait deferred. ASSESSMENT/PLAN [name] is a 54 y.o. male with history of HTN, hyperlipidemia, CAD s/p stenting, MI, smoking, newly diagnosed left lung cancer on 2/16, right PE who developed sudden onset of expressive aphasia. CT showed small R parietal SAH. He did not receive IV t-PA due to [facility].   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 Small R PV ischemic infarct. R frontal SAH.  Multifocal small brain mets.  Repeat CT in 2 days  Check carotid Doppler  (EPIC order not currently available)  Check TCD    2D Echo  09/22/2014 No source of embolus   EEG  normal  Check lipid panel, LDL 102 mg%  Check HgbA1c, pending  SCDs for VTE prophylaxis Diet Heart Room service appropriate?: Yes; Fluid consistency:: Thin  aspirin 81 mg orally every day, clopidogrel 75 mg orally every day and full dose lovenox prior to admission, for now  no antithrombotics given  hemorrhage. If CT improved after 2 days, will plan resumption of low dose aspirin. Long-term medication is complex given recent stents, cancer and ischemic stroke   Continue ICU level care x 24h  Therapy recommendations:  None anticipated. 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  Undergoing chemo treatment  Neurosurgery [facility]) consulted  Brain mets felt to be amenable to stereotactic radiosurgery  Pulmonary Embolism  Hypercoagulable from cancer  On full dose lovenox prior to admission  Given 50 protamine  IVC filter planned today  Hypertension  Home meds:   lopressor  Stable  Hyperlipidemia  Home meds:  lipitor 80, resumed in hospital  LDL pending, goal < 70  Continue statin at discharge  Other Stroke Risk Factors  Cigarette smoker, 39 year history, advised to stop smoking  Marijuana use  Coronary artery disease - MI 2006, 2012; stent 2006, 2012, 2015; PCI. Burt Knack)  Other Active Problems  Leukocytosis, no indication of infection  Elevated troponins   Hospital day # [place] for Pager information 11/28/2014 2:40 PM  I have personally examined this patient, reviewed notes, independently viewed imaging studies, participated in medical decision making and plan of care. I have made any additions or clarifications directly to the above note. Agree with note above. He presented with TIA but MRI scan shows silent tiny right frontal convexity SAH likely related to being on dual antiplatelets and Lovenox as well as multiple small bilateral embolic strokes of unknown etiology.as well as brain metastasis. Hypercoagulability from cancer or it`s treatment could certainly be contributory. This is a extremely complex situation requiring medical decision making of high complexity has we have to hold antiplatelet and Lovenox due to acute SAH but doing so increases risk of coronary stent thrombosis and pulmonary embolism but we do not have any choice.Recommend repeat head CTin am and if intracranial blood is isodense start aspirin 81 mg daily and after 2 weeks may start anticoagulation with Eliquis given h/o PE.D/w Dr Burt Knack cardiologist who agrees as stent is more than 6 months old.Long d/w patient and family members and explained complex difficult and  tricky situation and they agree with plan. This patient is critically ill and at significant risk of neurological worsening, death and care requires constant monitoring of vital signs, hemodynamics,respiratory and cardiac monitoring,review of multiple databases, neurological assessment, discussion with family, other specialists and medical decision making of high complexity.I have made any additions or clarifications directly to the above note.  I spent 30 minutes of neurocritical care time  in the care of  this patient.   Antony Contras, MD Medical Director [place] Pager: [phone] 11/28/2014 2:40 PM    To contact Stroke Continuity provider, please refer to [email or web address] After hours, contact  General Neurology

## 2014-11-28 NOTE — Progress Notes (Signed)
[place] Progress Note Patient Name: [name] DOB: 1960/11/13 MRN: [id number]   Date of Service  11/28/2014  HPI/Events of Note  Severe L shoulder pain  eICU Interventions  Additional fentanyl X 1     Intervention Category Intermediate Interventions: Pain - evaluation and management  Merton Border 11/28/2014, 6:11 AM

## 2014-11-29 ENCOUNTER — Other Ambulatory Visit: Payer: Self-pay | Admitting: Nurse Practitioner

## 2014-11-29 ENCOUNTER — Inpatient Hospital Stay (HOSPITAL_COMMUNITY): Payer: 59

## 2014-11-29 ENCOUNTER — Encounter (HOSPITAL_COMMUNITY): Payer: Self-pay

## 2014-11-29 ENCOUNTER — Ambulatory Visit: Admit: 2014-11-29 | Payer: 59

## 2014-11-29 ENCOUNTER — Ambulatory Visit: Payer: 59

## 2014-11-29 ENCOUNTER — Encounter: Payer: Self-pay | Admitting: Nurse Practitioner

## 2014-11-29 ENCOUNTER — Telehealth: Payer: Self-pay | Admitting: Internal Medicine

## 2014-11-29 DIAGNOSIS — Z7901 Long term (current) use of anticoagulants: Secondary | ICD-10-CM | POA: Insufficient documentation

## 2014-11-29 DIAGNOSIS — D630 Anemia in neoplastic disease: Secondary | ICD-10-CM | POA: Insufficient documentation

## 2014-11-29 DIAGNOSIS — I639 Cerebral infarction, unspecified: Secondary | ICD-10-CM | POA: Insufficient documentation

## 2014-11-29 DIAGNOSIS — R29818 Other symptoms and signs involving the nervous system: Secondary | ICD-10-CM | POA: Insufficient documentation

## 2014-11-29 LAB — TROPONIN I: Troponin I: 14.88 ng/mL (ref <0.031)

## 2014-11-29 LAB — HEMOGLOBIN A1C
Hgb A1c MFr Bld: 5.2 % (ref 4.8–5.6)
Mean Plasma Glucose: 103 mg/dL

## 2014-11-29 MED ORDER — ASPIRIN 325 MG PO TABS
325.0000 mg | ORAL_TABLET | Freq: Every day | ORAL | Status: DC
  Administered 2014-11-29: 325 mg via ORAL
  Filled 2014-11-29 (×2): qty 1

## 2014-11-29 MED ORDER — MAGNESIUM SULFATE 2 GM/50ML IV SOLN
2.0000 g | Freq: Once | INTRAVENOUS | Status: AC
  Administered 2014-11-29: 2 g via INTRAVENOUS
  Filled 2014-11-29: qty 50

## 2014-11-29 MED ORDER — LORAZEPAM 2 MG/ML IJ SOLN
1.0000 mg | Freq: Once | INTRAMUSCULAR | Status: AC
  Administered 2014-11-29: 1 mg via INTRAVENOUS

## 2014-11-29 MED ORDER — LORAZEPAM 2 MG/ML IJ SOLN
INTRAMUSCULAR | Status: AC
  Filled 2014-11-29: qty 1

## 2014-11-29 MED ORDER — ENSURE ENLIVE PO LIQD: 237.0000 mL | Freq: Three times a day (TID) | ORAL | Status: DC

## 2014-11-29 NOTE — Assessment & Plan Note (Signed)
Patient's hemoglobin has decreased from 11.7 down to 8.6.  Patient  Denies any obvious signs of bleeding.  Will transfer patient to the emergency department for further evaluation and management.

## 2014-11-29 NOTE — Progress Notes (Addendum)
STROKE TEAM PROGRESS NOTE   HISTORY [name] is an 54 y.o. male with a past medical history significant for HTN, hyperlipidemia, CAD s/p stenting, MI, smoking, newly diagnosed left lung cancer on 2/16, right PE, transferred to [facility] for further evaluation and management of right sulcal SAH. Patient indicated that he was at the [place] center yesterday, about to initiate his chemotherapy, but after going to the bathroom he developed sudden onset of difficulty expressing himself that he describes as " knowing what I wanted to say but couldn't get anything out". The episode lasted for at least 10 minutes and he recalls that he also had some numbness-tingling around the left side of his mouth that then travelled to the left hand. No associated HA, vertigo, double vision, confusion, focal weakness, or vision impairment. Of note, he tells me that the day before he had " numbness and tingling of the left lower face and left arm lasting for 10 or 15 minutes". His LKW was 12/09/2014 at 1400. Patient was sent to [facility] where he had a CT brain that I reviewed myself and demonstrated a small acute subarachnoid hemorrhage within a sulcus at the RIGHT parietal lobe. Mr. Swoveland has been on a combination of aspirin and plavix since previous MI and Lovenox 100 mg was added this past February for PE treatment. Patient was not administered TPA secondary to [facility]. He was admitted to the neuro ICU for further evaluation and treatment.   SUBJECTIVE (INTERVAL HISTORY) His wife is at the bedside.  Overall he feels his condition is stable. Repeat CT scan of the head shows stable and diminishing right frontal subarachnoid hemorrhage but shows a new interval left frontal MCA branch infarct not seen on MRI 3 days ago.   OBJECTIVE Temp:  [97.5 F (36.4 C)-98.6 F (37 C)] 98.5 F (36.9 C) (05/05 0805) Pulse Rate:  [70-112] 112 (05/05 1000) Cardiac Rhythm:  [-] Normal sinus rhythm (05/05 1200) Resp:  [10-31] 25 (05/05  1200) BP: (116-154)/(70-92) 125/79 mmHg (05/05 1200) SpO2:  [86 %-100 %] 99 % (05/05 1000) Weight:  [157 lb 6.5 oz (71.4 kg)] 157 lb 6.5 oz (71.4 kg) (05/05 0500)  No results for input(s): GLUCAP in the last 168 hours.  Recent Labs Lab 11/29/2014 1236 12/21/2014 1535 11/27/14 1228 11/28/14 0235  NA 140 140 135 133*  K 4.2 3.9 3.7 4.2  CL  --  106 102 100*  CO2 '23 24 22 22  '$ GLUCOSE 100 103* 115* 143*  BUN 20.8 24* 17 15  CREATININE 0.8 0.85 0.84 0.82  CALCIUM 9.7 9.8 9.1 9.4  MG  --   --  1.8 1.9  PHOS  --   --  3.2 4.2    Recent Labs Lab 12/06/2014 1236 12/09/2014 1535  AST 24 31  ALT 40 44  ALKPHOS 108 111  BILITOT <0.20 0.2*  PROT 7.2 7.8  ALBUMIN 3.6 4.3    Recent Labs Lab 12/15/2014 1236 12/11/2014 1535 11/27/14 1455 11/28/14 0235  WBC 21.8* 19.2* 16.1* 18.5*  NEUTROABS 16.1* 13.4*  --   --   HGB 8.6* 8.8* 7.8* 8.0*  HCT 26.2* 26.8* 23.5* 23.6*  MCV 92.0 95.4 92.2 91.5  PLT 448* 474* 382 376    Recent Labs Lab 11/27/14 0045 11/27/14 0602 11/27/14 1228 11/29/14 0951  TROPONINI 0.32* 0.62* 1.07* 14.88*    Recent Labs  12/09/2014 1535  LABPROT 14.3  INR 1.10    Recent Labs  11/25/2014 [place]  LABSPEC 1.024  PHURINE 6.0  GLUCOSEU NEGATIVE  HGBUR NEGATIVE  BILIRUBINUR NEGATIVE  KETONESUR NEGATIVE  PROTEINUR NEGATIVE  UROBILINOGEN 0.2  NITRITE NEGATIVE  LEUKOCYTESUR NEGATIVE       Component Value Date/Time   CHOL 172 11/28/2014 0235   TRIG 166* 11/28/2014 0235   HDL 32* 11/28/2014 0235   CHOLHDL 5.4 11/28/2014 0235   VLDL 33 11/28/2014 0235   LDLCALC 107* 11/28/2014 0235   Lab Results  Component Value Date   HGBA1C 5.2 11/28/2014   No results found for: LABOPIA, COCAINSCRNUR, LABBENZ, AMPHETMU, THCU, LABBARB   Recent Labs Lab 12/24/2014 [place] <5    Dg Chest 2 View 11/27/2014    1. Persistent mediastinal and left hilar adenopathy. 2. Persistent ill-defined density left upper lobe . No new lesions  identified. 12/09/2014    1. Unchanged LEFT upper lobe spiculated pulmonary nodule. 2. RIGHT peritracheal and LEFT hilar adenopathy.     Ct Head Wo Contrast 12/08/2014   Acute subarachnoid hemorrhage within a sulcus at the RIGHT parietal lobe.  No other intracranial abnormalities.    Mr Kizzie Parsons Contrast 11/27/2014    1. 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 No significant mass effect or associated hemorrhage. 2. Additional subcentimeter focus of restricted diffusion adjacent to the atrium of the right lateral ventricle, also suspicious for small acute ischemic infarct. 3. Stable volume and distribution of acute subarachnoid hemorrhage within the right frontal region as compared to prior CT. No new intracranial hemorrhage. 4. 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 One of these lesions lies in close proximity to the acute subarachnoid hemorrhage within the right frontal lobe, raising the suspicion that this is the source of bleeding.   Mr Hilary Parsons 11/27/2014     5. Normal MRV of the brain.   PHYSICAL EXAM  . Afebrile. Head is nontraumatic. Neck is supple without bruit.    Cardiac exam no murmur or gallop. Lungs are clear to auscultation. Distal pulses are well felt.  Neurological Exam ;  Awake  Alert oriented x 3. Normal speech and language.eye movements full without nystagmus.fundi were not visualized. Vision acuity and fields appear normal. Hearing is normal. Palatal movements are normal. Face symmetric. Tongue midline. Normal strength, tone, reflexes and coordination. Normal sensation. Gait deferred. ASSESSMENT/PLAN [name] is a 54 y.o. male with history of HTN, hyperlipidemia, CAD s/p stenting, MI, smoking, newly diagnosed left lung cancer on 2/16, right PE who developed sudden onset of  expressive aphasia. CT showed small R parietal SAH. He did not receive IV t-PA due to [facility].   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 Small R PV ischemic infarct. R frontal SAH.  Multifocal small brain mets.  Check carotid Doppler  (EPIC order not currently available)  Check TCD    2D Echo  09/22/2014 No source of embolus   EEG  normal  Check lipid panel, LDL 102 mg%  Check HgbA1c, 5.2  SCDs for VTE prophylaxis Diet Heart Room service appropriate?: Yes; Fluid consistency:: Thin  aspirin 81 mg orally every day, clopidogrel  75 mg orally every day and full dose lovenox prior to admission, for now  no antithrombotics given hemorrhage. If CT improved after 2 days, will plan resumption of low dose aspirin. Long-term medication is complex given recent stents, cancer and ischemic stroke   Continue ICU level care x 24h  Therapy recommendations:  None anticipated. 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  Undergoing chemo treatment  Neurosurgery [facility]) consulted  Brain mets felt to be amenable to stereotactic radiosurgery  Pulmonary Embolism  Hypercoagulable from cancer  On full dose lovenox prior to admission  Given 50 protamine  IVC filter planned today  Hypertension  Home meds:   lopressor  Stable  Hyperlipidemia  Home meds:  lipitor 80, resumed in hospital  LDL pending, goal < 70  Continue statin at discharge  Other Stroke Risk Factors  Cigarette smoker, 39 year history, advised to stop smoking  Marijuana use  Coronary artery disease - MI 2006, 2012; stent 2006, 2012, 2015; PCI. Burt Knack)  Other  Active Problems  Leukocytosis, no indication of infection  Elevated troponins   Hospital day # [place] for Pager information 11/29/2014 1:10 PM  I have personally examined this patient, reviewed notes, independently viewed imaging studies, participated in medical decision making and plan of care. I have made any additions or clarifications directly to the above note. Agree with note above. He presented with TIA but MRI scan shows silent tiny right frontal convexity SAH likely related to being on dual antiplatelets and Lovenox as well as multiple small bilateral embolic strokes of unknown etiology.as well as brain metastasis. Hypercoagulability from cancer or it`s treatment could certainly be contributory. This is a extremely complex situation requiring medical decision making of high complexity has we have to hold antiplatelet and Lovenox due to acute SAH but doing so increases risk of coronary stent thrombosis and pulmonary embolism but we do not have any choice. Start aspirin 325 mg daily given interval new left frontal infarct and check CUS and TCD studies. and after 2 weeks may start anticoagulation with Eliquis given h/o PE .Long d/w patient and family members and explained complex difficult and  tricky situation and they agree with plan.Transfer to neurology floor bed today.   Antony Contras, MD Medical Director [facility] Pager: [phone] 11/29/2014 1:10 PM ADDENDUM : I had a long discussion with the patient and wife regarding repeat MRI from today showing increasing bihemispheric strokes in the setting of being off antiplatelets and anticoagulation which is unfortunately unavoidable given his recent subarachnoid hemorrhage. We have started the patient on aspirin today but I'm reluctant to consider IV heparin due to the fresh subarachnoid hemorrhage. The patient remains at risk for follow-up and further clots and strokes as he was on  aspirin previously which haven't helped but it would be too dangerous to start him on full dose heparin. Patient has also had neurological decline during the day and the patient's wife understands that this is a moving the wrong direction and is unclear at this point he he may still be a candidate for brain radiation. The patient's wife agrees that it is unsafe to start IV heparin hence we will continue aspirin for now and monitor his neurological status and see how he does over the next few days. This patient is critically ill and at significant risk of neurological worsening, death and care requires constant monitoring of  vital signs, hemodynamics,respiratory and cardiac monitoring,review of multiple databases, neurological assessment, discussion with family, other specialists and medical decision making of high complexity.I have made any additions or clarifications directly to the above note.  I spent 30 minutes of neurocritical care time  in the care of  this patient. Antony Contras, MD 11/29/14 5 21pm To contact Stroke Continuity provider, please refer to [email or web address] After hours, contact General Neurology

## 2014-11-29 NOTE — Telephone Encounter (Signed)
Per 05/05 cancelled all visits for 05/09 per 05/05 POF, pt is in the hospital.... KJ

## 2014-11-29 NOTE — Progress Notes (Signed)
CRITICAL VALUE ALERT  Critical value received:  Troponin 14.88  Date of notification:  11/29/2014  Time of notification:  5462  Critical value read back:yes Nurse who received alert:  Otho Najjar   MD notified (1st page):  Nelda Marseille  Time of first page:  [place]  Responding MD:  Nelda Marseille  Time MD responded: [phone]

## 2014-11-29 NOTE — Assessment & Plan Note (Signed)
Patient has a History of pulmonary embolism; and continues to take Lovenox as directed.

## 2014-11-29 NOTE — Assessment & Plan Note (Addendum)
Patient recently diagnosed with lung cancer.  Presented to the [place] today to initiate his first cycle of carboplatin/paclitaxel chemotherapy.  However, prior to initiating his chemotherapy.  Patient and his wife both report.  Patient has been experiencing some intermittent left sided weakness.  He is specifically complaining some numbness to the left side of his face and his left tongue as well.  He reports some numbness and tingling to his left arm and leg as well.  He states that he first noticed the symptoms last night; and they re-occurred again this morning.  Patient denies any numbness or tingling at this present time.  Patient also complains of some intermittent expressive aphasia since last night as well.  On exam.-Patient appears neurologically intact; with no obvious weakness and bilateral grips strong and equal.  Decision was made to hold initiation of chemotherapy; and to transfer patient to the emergency department for further evaluation and management.  Brief history and report were called to the emergency department charge nurse prior to transferring patient to the emergency department via wheelchair with O2 via nasal cannula per the cancer Center nurse.  Patient is presently scheduled to return on Dec 09, 2014 for labs, follow up visit, and chemotherapy.  However, will most likely need to reschedule all due to patient's hospitalization.

## 2014-11-29 NOTE — Progress Notes (Signed)
Nutrition Follow-up  DOCUMENTATION CODES:  Non-severe (moderate) malnutrition in context of acute illness/injury  INTERVENTION:  Ensure Enlive (each supplement provides 350kcal and 20 grams of protein) (TID)  NUTRITION DIAGNOSIS:  Malnutrition related to acute illness, cancer and cancer related treatments as evidenced by percent weight loss, mild depletion of body fat, moderate depletions of muscle mass (4% weight loss x 2 weeks).  ongoing  GOAL:  Patient will meet greater than or equal to 90% of their needs  Not met  MONITOR:  PO intake, Supplement acceptance, Weight trends  REASON FOR ASSESSMENT:  Malnutrition Screening Tool    ASSESSMENT: Pt recently diagnosed with stage IV lung cancer, he was supposed to start chemo this week but was admitted with aphasia/SAH which was likely due to antiplatelets and anticoagulation. Oncology following, MRI reveals 3 mets.    Pt unable to answer any questions. Wife at bedside and provides hx. Per wife pt has been eating pretty good recently and drinking Boost at home trying to beef up before chemo. Despite this effort pt has continued to lose weight (4% x 2 weeks). Pt consumed 25% of his Breakfast this am.  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   Height:  Ht Readings from Last 1 Encounters:  11/30/2014 $RemoveB'5\' 9"'SkxOVRBu$  (1.753 m)    Weight:  Wt Readings from Last 1 Encounters:  11/29/14 157 lb 6.5 oz (71.4 kg)    Ideal Body Weight:  72.7 kg  Wt Readings from Last 10 Encounters:  11/29/14 157 lb 6.5 oz (71.4 kg)  11/15/14 158 lb 12.8 oz (72.031 kg)  10/16/14 164 lb (74.39 kg)  10/15/14 164 lb 12.8 oz (74.753 kg)  09/24/14 157 lb 3.2 oz (71.305 kg)  06/28/14 159 lb 1.9 oz (72.176 kg)  06/21/14 162 lb 14.7 oz (73.9 kg)  11/21/13 150 lb 12 oz (68.38 kg)  10/24/12 144 lb (65.318 kg)  09/10/11 148 lb (67.132 kg)    BMI:  Body mass index is 23.23 kg/(m^2).  Estimated Nutritional  Needs:  Kcal:  2000-2200  Protein:  100-110 gm  Fluid:  2.0-2.2 L  Skin:  Reviewed, no issues  Diet Order:  Diet Heart Room service appropriate?: Yes; Fluid consistency:: Thin  EDUCATION NEEDS:  No education needs identified at this time   Intake/Output Summary (Last 24 hours) at 11/29/14 1226 Last data filed at 11/29/14 1100  Gross per 24 hour  Intake    900 ml  Output    675 ml  Net    225 ml    Last BM:  [place], [place], [place] Pager [phone] After Hours Pager

## 2014-11-29 NOTE — Progress Notes (Signed)
PULMONARY / CRITICAL CARE MEDICINE   Name: [name] MRN: [id number] DOB: Jul 11, 1961    ADMISSION DATE:  12/05/2014 CONSULTATION DATE:  11/29/2014  REFERRING MD :  EDP  CHIEF COMPLAINT:  Speech difficulties  INITIAL PRESENTATION:   [name] is a 54 y.o. M with PMH as outlined below including recent diagnosis of lung CA (diagnosed through biopsy via EBUS and mediastinoscopy (11/05/14 - Dr. Roxan Hockey), as well as PE diagnosed in Feb 2016 (on lovenox).  Back in February, pt noted swelling of his LE's and was awaiting venous doppler; however, went on to develop headache and SOB as well as left shoulder pain.  He was seen in ED where he hat CTA done on 09/21/14.  This revealed acute PE in RUL as well as a couple of pulmonary nodules concerning for malignancy.  MRI brain performed 2/27 was negative for mets to the brain.  PET scan performed 10/22/14 showed hypermetabolic lesions in mediastinum, paratracheal, sub-carinal, and hilar lymph nodes with left lung cancer.  This is what prompted referral to Dr. Roxan Hockey followed by EBUS / mediastinoscopy.  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  He was scheduled for initiation of chemoradiation on 12/21/2014.  Upon arrival to cancer center that afternoon, he had acute speech difficulties.  He was sent to the ED for further evaluation.  CT of the brain revealed a small amount of sulcal SAH over the right hemisphere.  He was subsequently transferred to [facility] for further evaluation.  He was seen in consultation by neurosurgery (Dr. Sherwood Gambler) and it was felt that his spontaneous SAH was most likely related to his antiplatelet therapy and anticoagulation given that it was not in the typical distribution of a aneurysmal SAH.  They also did recommend repeating brain MRI given that this could be related to brain mets or paraneoplastic syndrome.  Antiplatelets and anticoagulation were stopped and reversed ($RemoveBefor'50mg'bpJGZLocdFGD$  protamine,  PCCM was  consulted for medical management.  STUDIES:  CT head 5/2 >>> acute SAH within sulcus at the right parietal lobe. CXR 5/2 >>> unchanged LUL spiculated nodule, right peritracheal and left hilar adenopathy. 5/2 - admitted to [facility] ICU for small right [facility] 11/27/14: Somewhat anxious but in good spirits given his circumstances. S/p IVC filter +  SUBJECTIVE/OVERNIGHT/INTERVAL HX 11/28/14: seen by Rad Onc- KPS 80 determined . Repeat Brain MRI with 3 x < 1cm brain met. Rx Radiosurgery palanned. SAH felt unrelated. Per RN headache despite fentanyl prn is the major bedside complaint. Neuro intact per RN.   C/o left shoulder pain since admission - sever, worsening, fentanyl needed always and still breaking through, no radiation, deep gnawing pain, no difficulty movement  VITAL SIGNS: Temp:  [97.5 F (36.4 C)-98.6 F (37 C)] 98.5 F (36.9 C) (05/05 0805) Pulse Rate:  [70-97] 85 (05/05 0700) Resp:  [10-31] 18 (05/05 0700) BP: (111-160)/(69-93) 150/78 mmHg (05/05 0700) SpO2:  [86 %-100 %] 97 % (05/05 0700) Weight:  [71.4 kg (157 lb 6.5 oz)] 71.4 kg (157 lb 6.5 oz) (05/05 0500) HEMODYNAMICS:   VENTILATOR SETTINGS:   INTAKE / OUTPUT: Intake/Output      05/04 0701 - 05/05 0700 05/05 0701 - 05/06 0700   P.O. 780    I.V. (mL/kg) 260 (3.6)    IV Piggyback 50    Total Intake(mL/kg) 1090 (15.3)    Urine (mL/kg/hr) 900 (0.5)    Emesis/NG output     Total Output 900     Net +190  PHYSICAL EXAMINATION: General: Pleasant adult male, in NAD. Deconditoned Neuro: A&O x 3, non-focal.  HEENT: [place]/AT. PERRL, sclerae anicteric. Cardiovascular: RRR, no M/R/G.  Lungs: Respirations even and unlabored.  CTA bilaterally, No W/R/R. Abdomen: BS x 4, soft, NT/ND.  Musculoskeletal: No gross deformities, no edema. Left shoulder range of motion normal. No swelling.  Skin: Intact, warm, no rashes.  LABS:  PULMONARY No results for input(s): PHART, PCO2ART, PO2ART, HCO3, TCO2, O2SAT in the last 168  hours.  Invalid input(s): PCO2, PO2  CBC  Recent Labs Lab 12/24/2014 1535 11/27/14 1455 11/28/14 0235  HGB 8.8* 7.8* 8.0*  HCT 26.8* 23.5* 23.6*  WBC 19.2* 16.1* 18.5*  PLT 474* 382 376    COAGULATION  Recent Labs Lab 12/22/2014 1535  INR 1.10   CARDIAC   Recent Labs Lab 11/27/14 0045 11/27/14 0602 11/27/14 1228  TROPONINI 0.32* 0.62* 1.07*   No results for input(s): PROBNP in the last 168 hours.  CHEMISTRY  Recent Labs Lab 12/13/2014 1236  12/20/2014 1535 11/27/14 1228 11/28/14 0235  NA 140  --  140 135 133*  K 4.2  < > 3.9 3.7 4.2  CL  --   --  106 102 100*  CO2 23  --  $R'24 22 22  'au$ GLUCOSE 100  --  103* 115* 143*  BUN 20.8  --  24* 17 15  CREATININE 0.8  --  0.85 0.84 0.82  CALCIUM 9.7  --  9.8 9.1 9.4  MG  --   --   --  1.8 1.9  PHOS  --   --   --  3.2 4.2  < > = values in this interval not displayed. Estimated Creatinine Clearance: 105.4 mL/min (by C-G formula based on Cr of 0.82).  LIVER  Recent Labs Lab 12/21/2014 1236 12/07/2014 1535  AST 24 31  ALT 40 44  ALKPHOS 108 111  BILITOT <0.20 0.2*  PROT 7.2 7.8  ALBUMIN 3.6 4.3  INR  --  1.10   INFECTIOUS No results for input(s): LATICACIDVEN, PROCALCITON in the last 168 hours.  ENDOCRINE CBG (last 3)  No results for input(s): GLUCAP in the last 72 hours.  IMAGING x48h Dg Shoulder 1v Left  11/27/2014   CLINICAL DATA:  54 year old male with left shoulder pain. Known stage IIIB lung cancer. Evaluate for metastasis.  EXAM: LEFT SHOULDER - 1 VIEW  COMPARISON:  Prior PET-CT 10/02/2014  FINDINGS: There is no evidence of fracture or dislocation. There is no evidence of arthropathy or other focal bone abnormality. No lytic or blastic osseous lesion. Soft tissues are unremarkable.  IMPRESSION: Negative.   Electronically Signed   By: Jacqulynn Cadet M.D.   On: 11/27/2014 18:17   Ct Head Wo Contrast  11/29/2014   CLINICAL DATA:  Followup subarachnoid hemorrhage, history of metastatic lung cancer.  EXAM: CT  HEAD WITHOUT CONTRAST  TECHNIQUE: Contiguous axial images were obtained from the base of the skull through the vertex without intravenous contrast.  COMPARISON:  MRI of the brain Nov 27, 2014 and CT of the head Nov 26, 2014  FINDINGS: Faint residual subarachnoid blood and RIGHT central sulcus. No intraparenchymal hemorrhage, mass effect or midline shift. New focal LEFT frontoparietal wedge-like hypodensities. Ventricles and sulci are normal for patient's age. New  No abnormal extra-axial fluid collections. Moderate calcific atherosclerosis of the carotid siphons. Ocular globes and orbital contents are unremarkable. Moderate ethmoid mucosal thickening without paranasal sinus air-fluid levels. The mastoid air cells are well aerated. Soft tissue within the  RIGHT external auditory canal most consistent with cerumen. No skull fracture.  IMPRESSION: New patchy hypodensities LEFT cerebrum concerning for acute middle cerebral artery territory infarcts, further propagation from prior MRI.  Small amount of residual degenerating RIGHT frontal subarachnoid hemorrhage.  Acute findings discussed with and reconfirmed by Ander Purpura, RN Neuro ICU on 11/29/2014 at 6:25 am.   Electronically Signed   By: Elon Alas   On: 11/29/2014 06:25   Ir Ivc Filter Plmt / S&i /img Guid/mod Sed  11/27/2014   CLINICAL DATA:  Pulmonary embolus, subarachnoid hemorrhage, patient can no longer be anticoagulated  EXAM: ULTRASOUND GUIDANCE FOR VASCULAR ACCESS  IVC CATHETERIZATION AND VENOGRAM  IVC FILTER INSERTION  Date:  5/3/20165/09/2014 12:12 pm  Radiologist:  M. Daryll Brod, MD  Guidance:  Ultrasound and fluoroscopic  FLUOROSCOPY TIME:  1 minutes 6 seconds, 25.55 mGy  MEDICATIONS AND MEDICAL HISTORY: 1 mg Versed, 50 mcg fentanyl  ANESTHESIA/SEDATION: 10 minutes  CONTRAST:  19mL OMNIPAQUE IOHEXOL 300 MG/ML  SOLN  COMPLICATIONS: None immediate  PROCEDURE: Informed consent was obtained from the patient following explanation of the procedure, risks,  benefits and alternatives. The patient understands, agrees and consents for the procedure. All questions were addressed. A time out was performed.  Maximal barrier sterile technique utilized including caps, mask, sterile gowns, sterile gloves, large sterile drape, hand hygiene, and betadine prep.  Under sterile condition and local anesthesia, right internal jugular venous access was performed with ultrasound. Over a guide wire, the IVC filter delivery sheath and inner dilator were advanced into the IVC just above the IVC bifurcation. Contrast injection was performed for an IVC venogram.  IVC VENOGRAM: The IVC is patent. No evidence of thrombus, stenosis, or occlusion. No variant venous anatomy. The renal veins are identified at L1-2.  IVC FILTER INSERTION: Through the delivery sheath, the [place] IVC filter was deployed in the infrarenal IVC at the L2-3 level just below the renal veins and above the IVC bifurcation. Contrast injection confirmed position. There is good apposition of the filter against the IVC.  The delivery sheath was removed and hemostasis was obtained with compression for 5 minutes. The patient tolerated the procedure well. No immediate complications.  IMPRESSION: Ultrasound and fluoroscopically guided infrarenal IVC filter insertion.   Electronically Signed   By: Jerilynn Mages.  Shick M.D.   On: 11/27/2014 13:00   Dg Abd Portable 1v  11/27/2014   CLINICAL DATA:  54 year old male with vomiting and nausea  EXAM: PORTABLE ABDOMEN - 1 VIEW  COMPARISON:  PET-CT 10/02/2014  FINDINGS: Normal, nonobstructed bowel gas pattern. No evidence organomegaly or suspicious calcification. [place] potentially retrievable IVC filter in the expected location.  IMPRESSION: Negative.  Potentially retrievable IVC filter in the expected position.   Electronically Signed   By: Jacqulynn Cadet M.D.   On: 11/27/2014 18:58   ASSESSMENT / PLAN:  NEUROLOGIC A:   Acute SAH  - likely due to antiplatelets and  anticoagulation;  MRI reveals 3 mets  .- Rad onc 11/27/14 does not feel met and SAH related  - Neurosurgery does not feel patient is a surgical candidate.  Neuro intact 11/28/14  P:   Neurosurgery and neurology following, appreciate input. Repeat  MRI on 11/29/2014 pending. Neurosurg recommends radiation therapy as outpatient. Continue outpatient lorazepam PRN.  PULMONARY A: Recently diagnosed adenocarcinoma stage 4- was scheduled to start chemoradiation 12/12/2014 Known PE - on lovenox Former smoker   - not in distress. Seen by rad-onc - recommended radiosurgery. S/o IVC filter yesterda 11/27/14  P:   Dr. Inda Merlin notified. Lovenox d/c'd given [facility]  Anticoag plan as above.  CARDIOVASCULAR A:  Hx HTN, HLD, MI, CAD s/p DES 2006 - on plavix and aspirin Elevated troponin ().19 > 0.32) - initial EKG reassuring NSTEMI  - echo done result pending P:  Plavix and ASA d/c'd given SAH. Continue outpatient lopressor, atorvastatin. Troponin positive but will not be able to start anticoag until as planned above. Draw one more troponin to see if dropping. Heparin contraindicated given SAH. Await echo result 11/28/14 ASA by Thursday 5/55/16 assuming head CT is negative. Beta blockers ordered with holding parameters. Hold cards consult for now.  RENAL A:   No acute issues but mild low mag P:   Repelete mag with 2 more grams BMET in AM. Replace electrolytes as indicated.  GASTROINTESTINAL A:   Nutrition P:   Diet as ordered.  HEMATOLOGIC/Oncology A:   Recently diagnosed lung CA Anemia VTE Prophylaxis  - rad onc planning radiosurgery   P:  Transfuse for Hgb < 7. SCD's only. CBC in AM. See rad onc notes 11/27/14  INFECTIOUS A:   Leukocytosis - no indication of infection, likely represents acute phase reactant P:   Monitor clinically. Hold abx.  ENDOCRINE A:   No acute issues P:   No interventions required.  GEN A: severe shuolder pain P MRI rule out mets and  shoulder issues - w/wo contrast Change RN directed fent oo fent pca If brain MRI is negative then will order NSAIDs for shoulder pain.  Family updated: Patient and family updated bedside.  Rush Farmer, M.D. [facility] Pulmonary/Critical Care Medicine. Pager: [phone]. After hours pager: [phone].  11/29/2014 8:15 AM

## 2014-11-29 NOTE — Progress Notes (Signed)
SYMPTOM MANAGEMENT CLINIC   HPI: [name] 54 y.o. male   Patient recently diagnosed with lung cancer.  Presented to the [facility] today to initiate his first cycle of carboplatin/paclitaxel chemotherapy.  However, prior to initiating his chemotherapy.  Patient and his wife both report patient has been experiencing some intermittent left sided weakness.  He is specifically complaining some numbness to the left side of his face and his left tongue as well.  He reports some numbness and tingling to his left arm and leg as well.  He states that he first noticed the symptoms last night; and they re-occurred again this morning.  Patient denies any numbness or tingling at this present time.  Patient also complains of some intermittent expressive aphasia since last night as well.  HPI  ROS  Past Medical History  Diagnosis Date  . HLD (hyperlipidemia)   . HTN (hypertension)     "took me off my BP pills" (06/20/2014)  . GERD (gastroesophageal reflux disease)     hx (06/20/2014)  . History of gout   . Myocardial infarction 02/2005; 05/2011  . Pulmonary embolism 09/21/2014    on Lovenox  . Lung mass 08/2014  . Coronary artery disease     a. s/p Taxus DES to CFX 8/06 in setting of NSTEMI;  b.  s/p Inf STEMI 06/21/11:  mRCA occluded>> PCI:  Overlapping Promus DES x 2 to RCA.;  c.  LHC (11/15):  pLAD 20-30%, oDx branches 50-60%, mCFX stent 95% ISR, dCFX stent ok, mRCA stent occluded with L-R collats, EF 50-55%, basal inf AK, mid Inf HK >> PCI:  3.25 x 23 mm Xience DES to [facility]    Past Surgical History  Procedure Laterality Date  . Left heart cath N/A 06/21/2011    Procedure: LEFT HEART CATH;  Surgeon: [name], MD;  Location: [facility] CATH LAB;  Service: Cardiovascular;  Laterality: N/A;  . Left heart catheterization with coronary angiogram N/A 06/20/2014    Procedure: LEFT HEART CATHETERIZATION WITH CORONARY ANGIOGRAM;  Surgeon: Peter M Martinique, MD;  Location: [facility] CATH LAB;  Service:  Cardiovascular;  Laterality: N/A;  . Coronary angioplasty with stent placement  02/2005; 05/2011; 06/20/2014    "1; ?2; 1"  . Esophagogastroduodenoscopy  2004  . Colonoscopy w/ polypectomy    . Video bronchoscopy with endobronchial ultrasound N/A 11/05/2014    Procedure: VIDEO BRONCHOSCOPY WITH ENDOBRONCHIAL ULTRASOUND;  Surgeon: [name], MD;  Location: [facility];  Service: Thoracic;  Laterality: N/A;  . Mediastinoscopy N/A 11/05/2014    Procedure:  MEDIASTINOSCOPY;  Surgeon: [name], MD;  Location: [facility];  Service: Thoracic;  Laterality: N/A;    has Coronary artery disease; Hypertension; Hyperlipidemia; Bradycardia; Tobacco abuse; Unstable angina pectoris; Pulmonary embolism; PE (pulmonary embolism); Lung mass; Swelling of lower extremity; Demand ischemia; Non-small cell carcinoma of lung, stage 3; Subarachnoid hemorrhage; SAH (subarachnoid hemorrhage); Pulmonary emboli; Brain metastases; Shoulder pain, left; Brain metastasis; Metastasis to brain; Stroke; Anemia in neoplastic disease; Acute focal neurological deficit; and Long term current use of anticoagulant therapy on his problem list.    has No Known Allergies.    Medication List       This list is accurate as of: 12/06/2014  2:22 PM.  Always use your most recent med list.               aspirin 81 MG tablet  Take 81 mg by mouth daily.     atorvastatin 80 MG tablet  Commonly  known as:  LIPITOR  TAKE ONE TABLET BY MOUTH ONCE DAILY     clopidogrel 75 MG tablet  Commonly known as:  PLAVIX  TAKE ONE TABLET BY MOUTH ONCE DAILY     diphenhydramine-acetaminophen 25-500 MG Tabs  Commonly known as:  TYLENOL PM  Take 1 tablet by mouth at bedtime.     enoxaparin 100 MG/ML injection  Commonly known as:  LOVENOX  Inject 1 mL (100 mg total) into the skin daily.     levofloxacin 500 MG tablet  Commonly known as:  LEVAQUIN  Take 500 mg by mouth daily.     LORazepam 1 MG tablet  Commonly known as:  ATIVAN  Take 1  mg by mouth every 6 (six) hours as needed for anxiety (anxiety).     metoprolol tartrate 25 MG tablet  Commonly known as:  LOPRESSOR  TAKE ONE-HALF TABLET BY MOUTH TWICE DAILY     nitroGLYCERIN 0.4 MG SL tablet  Commonly known as:  NITROSTAT  Place 1 tablet (0.4 mg total) under the tongue every 5 (five) minutes as needed for chest pain.     prochlorperazine 10 MG tablet  Commonly known as:  COMPAZINE  Take 1 tablet (10 mg total) by mouth every 6 (six) hours as needed for nausea or vomiting.     sildenafil 50 MG tablet  Commonly known as:  VIAGRA  Take 50 mg by mouth daily as needed for erectile dysfunction.         PHYSICAL EXAMINATION  Oncology Vitals 11/29/2014 11/29/2014 11/29/2014 11/29/2014 11/29/2014 11/29/2014 11/29/2014  Height - - - - - - -  Weight - - - - - - -  Weight (lbs) - - - - - - -  BMI (kg/m2) - - - - - - -  Temp - - - 98.5 - - -  Pulse - 112 91 - 95 85 89  Resp $Rem'25 21 13 'sSHf$ - $Re'10 18 11  'ZpT$ SpO2 - 99 98 - 97 97 99  BSA (m2) - - - - - - -   BP Readings from Last 3 Encounters:  11/29/14 125/79  11/29/2014 148/89  11/20/14 140/90    Physical Exam  Constitutional: He is oriented to person, place, and time and well-developed, well-nourished, and in no distress.  HENT:  Head: Normocephalic and atraumatic.  Mouth/Throat: Oropharynx is clear and moist.  Eyes: Conjunctivae and EOM are normal. Pupils are equal, round, and reactive to light. Right eye exhibits no discharge. Left eye exhibits no discharge. No scleral icterus.  Neck: Normal range of motion. Neck supple. No JVD present. No tracheal deviation present. No thyromegaly present.  Cardiovascular: Normal rate, regular rhythm, normal heart sounds and intact distal pulses.   Pulmonary/Chest: Effort normal and breath sounds normal. No stridor. No respiratory distress. He has no wheezes. He has no rales. He exhibits no tenderness.  Abdominal: Soft. Bowel sounds are normal. He exhibits no distension and no mass. There is no  tenderness. There is no rebound and no guarding.  Musculoskeletal: Normal range of motion. He exhibits no edema or tenderness.  Lymphadenopathy:    He has no cervical adenopathy.  Neurological: He is alert and oriented to person, place, and time. He has normal reflexes. He displays normal reflexes. No cranial nerve deficit. He exhibits normal muscle tone. Coordination normal.  Skin: Skin is warm and dry. No rash noted. No erythema. No pallor.  Psychiatric: Affect normal.  Nursing note and vitals reviewed.   LABORATORY DATA:. Admission on  12/02/2014  Component Date Value Ref Range Status  . Alcohol, Ethyl (B) 12/20/2014 <5  <5 mg/dL Final   Comment:        LOWEST DETECTABLE LIMIT FOR SERUM ALCOHOL IS 11 mg/dL FOR MEDICAL PURPOSES ONLY   . Prothrombin Time 12/21/2014 14.3  11.6 - 15.2 seconds Final  . INR 12/18/2014 1.10  0.00 - 1.49 Final  . aPTT 12/07/2014 30  24 - 37 seconds Final  . WBC 12/11/2014 19.2* 4.0 - 10.5 K/uL Final  . RBC 12/09/2014 2.81* 4.22 - 5.81 MIL/uL Final  . Hemoglobin 12/11/2014 8.8* 13.0 - 17.0 g/dL Final  . HCT 12/08/2014 26.8* 39.0 - 52.0 % Final  . MCV 12/18/2014 95.4  78.0 - 100.0 fL Final  . MCH 12/19/2014 31.3  26.0 - 34.0 pg Final  . MCHC 12/16/2014 32.8  30.0 - 36.0 g/dL Final  . RDW 12/20/2014 13.5  11.5 - 15.5 % Final  . Platelets 12/10/2014 474* 150 - 400 K/uL Final  . Neutrophils Relative % 12/17/2014 70  43 - 77 % Final  . Lymphocytes Relative 12/07/2014 18  12 - 46 % Final  . Monocytes Relative 11/25/2014 8  3 - 12 % Final  . Eosinophils Relative 12/17/2014 3  0 - 5 % Final  . Basophils Relative 12/01/2014 1  0 - 1 % Final  . Neutro Abs 12/10/2014 13.4* 1.7 - 7.7 K/uL Final  . Lymphs Abs 11/29/2014 3.5  0.7 - 4.0 K/uL Final  . Monocytes Absolute 12/23/2014 1.5* 0.1 - 1.0 K/uL Final  . Eosinophils Absolute 12/04/2014 0.6  0.0 - 0.7 K/uL Final  . Basophils Absolute 12/25/2014 0.2* 0.0 - 0.1 K/uL Final  . RBC Morphology 12/22/2014  POLYCHROMASIA PRESENT   Final  . Sodium 12/14/2014 140  135 - 145 mmol/L Final  . Potassium 12/09/2014 3.9  3.5 - 5.1 mmol/L Final  . Chloride 12/07/2014 106  101 - 111 mmol/L Final  . CO2 11/29/2014 24  22 - 32 mmol/L Final  . Glucose, Bld 12/02/2014 103* 70 - 99 mg/dL Final  . BUN 11/25/2014 24* 6 - 20 mg/dL Final  . Creatinine, Ser 12/15/2014 0.85  0.61 - 1.24 mg/dL Final  . Calcium 12/21/2014 9.8  8.9 - 10.3 mg/dL Final  . Total Protein 12/21/2014 7.8  6.5 - 8.1 g/dL Final  . Albumin 12/18/2014 4.3  3.5 - 5.0 g/dL Final  . AST 12/21/2014 31  15 - 41 U/L Final  . ALT 12/20/2014 44  17 - 63 U/L Final  . Alkaline Phosphatase 12/25/2014 111  38 - 126 U/L Final  . Total Bilirubin 11/29/2014 0.2* 0.3 - 1.2 mg/dL Final  . GFR calc non Af Amer 12/21/2014 >60  >60 mL/min Final  . GFR calc Af Amer 12/01/2014 >60  >60 mL/min Final   Comment: (NOTE) The eGFR has been calculated using the CKD EPI equation. This calculation has not been validated in all clinical situations. eGFR's persistently <90 mL/min signify possible Chronic Kidney Disease.   . Anion gap 12/16/2014 10  5 - 15 Final  . Troponin i, poc 12/12/2014 0.19* 0.00 - 0.08 ng/mL Final  . Comment 11/27/2014 NOTIFIED PHYSICIAN   Final  . Comment 3 12/02/2014          Final   Comment: Due to the release kinetics of cTnI, a negative result within the first hours of the onset of symptoms does not rule out myocardial infarction with certainty. If myocardial infarction is still suspected, repeat the test  at appropriate intervals.   . Color, Urine 12/20/2014 YELLOW  YELLOW Final  . APPearance 12/09/2014 CLEAR  CLEAR Final  . Specific Gravity, Urine 12/23/2014 1.024  1.005 - 1.030 Final  . pH 12/20/2014 6.0  5.0 - 8.0 Final  . Glucose, UA 12/20/2014 NEGATIVE  NEGATIVE mg/dL Final  . Hgb urine dipstick 11/27/2014 NEGATIVE  NEGATIVE Final  . Bilirubin Urine 12/02/2014 NEGATIVE  NEGATIVE Final  . Ketones, ur 12/02/2014 NEGATIVE  NEGATIVE  mg/dL Final  . Protein, ur 12/16/2014 NEGATIVE  NEGATIVE mg/dL Final  . Urobilinogen, UA 12/09/2014 0.2  0.0 - 1.0 mg/dL Final  . Nitrite 12/12/2014 NEGATIVE  NEGATIVE Final  . Leukocytes, UA 11/28/2014 NEGATIVE  NEGATIVE Final   MICROSCOPIC NOT DONE ON URINES WITH NEGATIVE PROTEIN, BLOOD, LEUKOCYTES, NITRITE, OR GLUCOSE <1000 mg/dL.  [name] MRSA by PCR 12/22/2014 NEGATIVE  NEGATIVE Final   Comment:        The GeneXpert MRSA Assay (FDA approved for NASAL specimens only), is one component of a comprehensive MRSA colonization surveillance program. It is not intended to diagnose MRSA infection nor to guide or monitor treatment for MRSA infections.   . Troponin I 11/27/2014 0.32* <0.031 ng/mL Final   Comment:        PERSISTENTLY INCREASED TROPONIN VALUES IN THE RANGE OF 0.04-0.49 ng/mL CAN BE SEEN IN:       -UNSTABLE ANGINA       -CONGESTIVE HEART FAILURE       -MYOCARDITIS       -CHEST TRAUMA       -ARRYHTHMIAS       -LATE PRESENTING MYOCARDIAL INFARCTION       -COPD   CLINICAL FOLLOW-UP RECOMMENDED.   [name] Troponin I 11/27/2014 0.62* <0.031 ng/mL Final   Comment:        POSSIBLE MYOCARDIAL ISCHEMIA. SERIAL TESTING RECOMMENDED. REPEATED TO VERIFY CRITICAL RESULT CALLED TO, READ BACK BY AND VERIFIED WITH: T.BLACKBURN,RN 11/27/14 0724 BY BSLADE   . Troponin I 11/27/2014 1.07* <0.031 ng/mL Final   Comment:        POSSIBLE MYOCARDIAL ISCHEMIA. SERIAL TESTING RECOMMENDED. REPEATED TO VERIFY CRITICAL VALUE NOTED.  VALUE IS CONSISTENT WITH PREVIOUSLY REPORTED AND CALLED VALUE.   . Magnesium 11/27/2014 1.8  1.7 - 2.4 mg/dL Final  . Phosphorus 11/27/2014 3.2  2.5 - 4.6 mg/dL Final  . Sodium 11/27/2014 135  135 - 145 mmol/L Final  . Potassium 11/27/2014 3.7  3.5 - 5.1 mmol/L Final  . Chloride 11/27/2014 102  101 - 111 mmol/L Final  . CO2 11/27/2014 22  22 - 32 mmol/L Final  . Glucose, Bld 11/27/2014 115* 70 - 99 mg/dL Final  . BUN 11/27/2014 17  6 - 20 mg/dL Final  . Creatinine, Ser  11/27/2014 0.84  0.61 - 1.24 mg/dL Final  . Calcium 11/27/2014 9.1  8.9 - 10.3 mg/dL Final  . GFR calc non Af Amer 11/27/2014 >60  >60 mL/min Final  . GFR calc Af Amer 11/27/2014 >60  >60 mL/min Final   Comment: (NOTE) The eGFR has been calculated using the CKD EPI equation. This calculation has not been validated in all clinical situations. eGFR's persistently <90 mL/min signify possible Chronic Kidney Disease.   . Anion gap 11/27/2014 11  5 - 15 Final  . WBC 11/27/2014 16.1* 4.0 - 10.5 K/uL Final  . RBC 11/27/2014 2.55* 4.22 - 5.81 MIL/uL Final  . Hemoglobin 11/27/2014 7.8* 13.0 - 17.0 g/dL Final  . HCT 11/27/2014 23.5* 39.0 - 52.0 % Final  .  MCV 11/27/2014 92.2  78.0 - 100.0 fL Final  . MCH 11/27/2014 30.6  26.0 - 34.0 pg Final  . MCHC 11/27/2014 33.2  30.0 - 36.0 g/dL Final  . RDW 11/27/2014 13.7  11.5 - 15.5 % Final  . Platelets 11/27/2014 382  150 - 400 K/uL Final  . WBC 11/28/2014 18.5* 4.0 - 10.5 K/uL Final  . RBC 11/28/2014 2.58* 4.22 - 5.81 MIL/uL Final  . Hemoglobin 11/28/2014 8.0* 13.0 - 17.0 g/dL Final  . HCT 11/28/2014 23.6* 39.0 - 52.0 % Final  . MCV 11/28/2014 91.5  78.0 - 100.0 fL Final  . MCH 11/28/2014 31.0  26.0 - 34.0 pg Final  . MCHC 11/28/2014 33.9  30.0 - 36.0 g/dL Final  . RDW 11/28/2014 13.6  11.5 - 15.5 % Final  . Platelets 11/28/2014 376  150 - 400 K/uL Final  . Sodium 11/28/2014 133* 135 - 145 mmol/L Final  . Potassium 11/28/2014 4.2  3.5 - 5.1 mmol/L Final  . Chloride 11/28/2014 100* 101 - 111 mmol/L Final  . CO2 11/28/2014 22  22 - 32 mmol/L Final  . Glucose, Bld 11/28/2014 143* 70 - 99 mg/dL Final  . BUN 11/28/2014 15  6 - 20 mg/dL Final  . Creatinine, Ser 11/28/2014 0.82  0.61 - 1.24 mg/dL Final  . Calcium 11/28/2014 9.4  8.9 - 10.3 mg/dL Final  . GFR calc non Af Amer 11/28/2014 >60  >60 mL/min Final  . GFR calc Af Amer 11/28/2014 >60  >60 mL/min Final   Comment: (NOTE) The eGFR has been calculated using the CKD EPI equation. This calculation  has not been validated in all clinical situations. eGFR's persistently <90 mL/min signify possible Chronic Kidney Disease.   . Anion gap 11/28/2014 11  5 - 15 Final  . Magnesium 11/28/2014 1.9  1.7 - 2.4 mg/dL Final  . Phosphorus 11/28/2014 4.2  2.5 - 4.6 mg/dL Final  . Cholesterol 11/28/2014 172  0 - 200 mg/dL Final  . Triglycerides 11/28/2014 166* <150 mg/dL Final  . HDL 11/28/2014 32* >40 mg/dL Final  . Total CHOL/HDL Ratio 11/28/2014 5.4   Final  . VLDL 11/28/2014 33  0 - 40 mg/dL Final  . LDL Cholesterol 11/28/2014 107* 0 - 99 mg/dL Final   Comment:        Total Cholesterol/HDL:CHD Risk Coronary Heart Disease Risk Table                     Men   Women  1/2 Average Risk   3.4   3.3  Average Risk       5.0   4.4  2 X Average Risk   9.6   7.1  3 X Average Risk  23.4   11.0        Use the calculated Patient Ratio above and the CHD Risk Table to determine the patient's CHD Risk.        ATP III CLASSIFICATION (LDL):  <100     mg/dL   Optimal  100-129  mg/dL   Near or Above                    Optimal  130-159  mg/dL   Borderline  160-189  mg/dL   High  >190     mg/dL   Very High   . Hgb A1c MFr Bld 11/28/2014 5.2  4.8 - 5.6 % Final   Comment: (NOTE)         Pre-diabetes:  5.7 - 6.4         Diabetes: >6.4         Glycemic control for adults with diabetes: <7.0   . Mean Plasma Glucose 11/28/2014 103   Final   Comment: (NOTE) Performed At: [facility] [place], [place] [id number] Lindon Romp MD [id number]   . Troponin I 11/29/2014 14.88* <0.031 ng/mL Final   Comment:        POSSIBLE MYOCARDIAL ISCHEMIA. SERIAL TESTING RECOMMENDED. REPEATED TO VERIFY CRITICAL RESULT CALLED TO, READ BACK BY AND VERIFIED WITH: BETHANY COLBERTSON,RN AT 1137 11/29/14 BY ZBEECH.   Appointment on 12/07/2014  Component Date Value Ref Range Status  . WBC 12/07/2014 21.8* 4.0 - 10.3 10e3/uL Final  . NEUT# 12/24/2014 16.1* 1.5 - 6.5 10e3/uL Final  . HGB  12/17/2014 8.6* 13.0 - 17.1 g/dL Final  . HCT 12/18/2014 26.2* 38.4 - 49.9 % Final  . Platelets 12/06/2014 448* 140 - 400 10e3/uL Final  . MCV 12/10/2014 92.0  79.3 - 98.0 fL Final  . MCH 12/24/2014 30.1  27.2 - 33.4 pg Final  . MCHC 12/21/2014 32.7  32.0 - 36.0 g/dL Final  . RBC 11/28/2014 2.85* 4.20 - 5.82 10e6/uL Final  . RDW 12/07/2014 13.8  11.0 - 14.6 % Final  . lymph# 12/21/2014 3.2  0.9 - 3.3 10e3/uL Final  . MONO# 12/02/2014 1.8* 0.1 - 0.9 10e3/uL Final  . Eosinophils Absolute 11/25/2014 0.6* 0.0 - 0.5 10e3/uL Final  . Basophils Absolute 12/22/2014 0.2* 0.0 - 0.1 10e3/uL Final  . NEUT% 12/24/2014 73.7  39.0 - 75.0 % Final  . LYMPH% 12/24/2014 14.6  14.0 - 49.0 % Final  . MONO% 11/29/2014 8.2  0.0 - 14.0 % Final  . EOS% 12/17/2014 2.6  0.0 - 7.0 % Final  . BASO% 12/02/2014 0.9  0.0 - 2.0 % Final  . Sodium 11/29/2014 140  136 - 145 mEq/L Final  . Potassium 12/05/2014 4.2  3.5 - 5.1 mEq/L Final  . Chloride 12/19/2014 106  98 - 109 mEq/L Final  . CO2 12/20/2014 23  22 - 29 mEq/L Final  . Glucose 12/11/2014 100  70 - 140 mg/dl Final  . BUN 12/08/2014 20.8  7.0 - 26.0 mg/dL Final  . Creatinine 12/21/2014 0.8  0.7 - 1.3 mg/dL Final  . Total Bilirubin 12/06/2014 <0.20  0.20 - 1.20 mg/dL Final  . Alkaline Phosphatase 11/25/2014 108  40 - 150 U/L Final  . AST 12/20/2014 24  5 - 34 U/L Final  . ALT 11/25/2014 40  0 - 55 U/L Final  . Total Protein 12/06/2014 7.2  6.4 - 8.3 g/dL Final  . Albumin 12/02/2014 3.6  3.5 - 5.0 g/dL Final  . Calcium 12/15/2014 9.7  8.4 - 10.4 mg/dL Final  . Anion Gap 11/27/2014 12* 3 - 11 mEq/L Final  . EGFR 12/02/2014 >90  >90 ml/min/1.73 m2 Final   eGFR is calculated using the CKD-EPI Creatinine Equation (2009)     RADIOGRAPHIC STUDIES: Dg Chest 2 View  12/08/2014   CLINICAL DATA:  Numbness. Dysphagia. LEFT-sided numbness. History of cardiac problems.  EXAM: CHEST  2 VIEW  COMPARISON:  None.  FINDINGS: Spiculated LEFT upper lobe mass appears similar to  PET-CT 09/21/2014. Fullness of the RIGHT peritracheal soft tissues most compatible with lymphadenopathy. This is a new finding compared to 09/21/2014. There is no airspace disease. No pleural effusion. Cardiac stent noted. Monitoring leads project over the chest. Cardiopericardial silhouette within normal limits.  IMPRESSION:  1. Unchanged LEFT upper lobe spiculated pulmonary nodule. 2. RIGHT peritracheal and LEFT hilar adenopathy.   Electronically Signed   By: Dereck Ligas M.D.   On: 12/07/2014 15:50   Dg Shoulder 1v Left  11/27/2014   CLINICAL DATA:  54 year old male with left shoulder pain. Known stage IIIB lung cancer. Evaluate for metastasis.  EXAM: LEFT SHOULDER - 1 VIEW  COMPARISON:  Prior PET-CT 10/02/2014  FINDINGS: There is no evidence of fracture or dislocation. There is no evidence of arthropathy or other focal bone abnormality. No lytic or blastic osseous lesion. Soft tissues are unremarkable.  IMPRESSION: Negative.   Electronically Signed   By: Jacqulynn Cadet M.D.   On: 11/27/2014 18:17   Ct Head Wo Contrast  11/29/2014   CLINICAL DATA:  Followup subarachnoid hemorrhage, history of metastatic lung cancer.  EXAM: CT HEAD WITHOUT CONTRAST  TECHNIQUE: Contiguous axial images were obtained from the base of the skull through the vertex without intravenous contrast.  COMPARISON:  MRI of the brain Nov 27, 2014 and CT of the head Nov 26, 2014  FINDINGS: Faint residual subarachnoid blood and RIGHT central sulcus. No intraparenchymal hemorrhage, mass effect or midline shift. New focal LEFT frontoparietal wedge-like hypodensities. Ventricles and sulci are normal for patient's age. New  No abnormal extra-axial fluid collections. Moderate calcific atherosclerosis of the carotid siphons. Ocular globes and orbital contents are unremarkable. Moderate ethmoid mucosal thickening without paranasal sinus air-fluid levels. The mastoid air cells are well aerated. Soft tissue within the RIGHT external auditory canal  most consistent with cerumen. No skull fracture.  IMPRESSION: New patchy hypodensities LEFT cerebrum concerning for acute middle cerebral artery territory infarcts, further propagation from prior MRI.  Small amount of residual degenerating RIGHT frontal subarachnoid hemorrhage.  Acute findings discussed with and reconfirmed by Ander Purpura, RN Neuro ICU on 11/29/2014 at 6:25 am.   Electronically Signed   By: Elon Alas   On: 11/29/2014 06:25   Ct Head Wo Contrast  12/02/2014   CLINICAL DATA:  LEFT side facial numbness beginning last night, recently diagnosed with lung cancer, planned chemotherapy to start today but was to week, past history of coronary artery disease post MI, hypertension, smoker  EXAM: CT HEAD WITHOUT CONTRAST  TECHNIQUE: Contiguous axial images were obtained from the base of the skull through the vertex without intravenous contrast.  COMPARISON:  None; correlation MRI brain 09/22/2014  FINDINGS: Normal ventricular morphology.  No midline shift or mass effect.  High attenuation subarachnoid hemorrhage identified within a sulcus in the RIGHT parietal lobe.  No intraparenchymal hemorrhage, definite mass lesion, or evidence acute infarction.  No additional extra-axial collections.  Atherosclerotic calcifications at the carotid siphons.  Bones unremarkable.  RIGHT ethmoid air cell opacification noted.  IMPRESSION: Acute subarachnoid hemorrhage within a sulcus at the RIGHT parietal lobe.  No other intracranial abnormalities.  Critical Value/emergent results were called by telephone at the time of interpretation on 12/18/2014 at 1722 hr to Dr. Quintella Reichert , who verbally acknowledged these results.   Electronically Signed   By: Lavonia Dana M.D.   On: 11/29/2014 17:23   Mr Jeri Cos BO Contrast  11/27/2014   CLINICAL DATA:  89 or old male with history of newly diagnosed left lung cancer, presented with sudden onset expressive aphasia, found have acute subarachnoid hemorrhage.  EXAM: MRI HEAD  WITHOUT AND WITH CONTRAST  MRV HEAD WITHOUT CONTRAST  TECHNIQUE: Multiplanar, multiecho pulse sequences of the brain and surrounding structures were obtained without and with intravenous contrast.  Angiographic images of the intracranial venous structures were obtained using MRV technique without intravenous contrast.  Multiplanar multi sequence images of the brain were performed with and without the administration of IV gadolinium. Additional dedicated MRV images were obtained.  CONTRAST:  64mL MULTIHANCE GADOBENATE DIMEGLUMINE 529 MG/ML IV SOLN  COMPARISON:  Prior CT from 12/22/2014 as well as MRI from 09/22/2014.  FINDINGS: Cerebral volume within normal limits for patient age. Minimal patchy T2/FLAIR hyperintensity present within the periventricular and deep white matter both cerebral hemispheres, likely related to very mild chronic small vessel ischemic changes.  Serpiginous hyperintense FLAIR signal intensity seen layering within cortical sulci within the posterior right frontal lobe is consistent with acute subarachnoid hemorrhage. Overall, this is grossly stable in volume and distribution as compared to previous CT. No new hemorrhage identified. There is associated restricted diffusion with this subarachnoid hemorrhage.  There is abnormal restricted diffusion involving the cortical gray matter of the left frontotemporal region, consistent with acute ischemic infarct (series 3, image 21). This involves the posterior left insular cortex. No associated hemorrhage or significant mass effect. Additional small cortical infarct more posteriorly within the posterior left frontal region (series 3, image 23). Additional tiny cortical infarct more superiorly within knee postcentral gyrus of the left parietal lobe (series 3, image 37, 34).  Tiny focus of restricted diffusion adjacent to the atrium of the right lateral ventricle noted as well (series 3, image 24). Finding also may reflect a small acute ischemic infarct.   On post-contrast sequences, there is a tiny ring enhancing 4 mm lesion in the region of the right insula, compatible with a small intracranial metastasis (series 11, image 28). Additional 3 mm metastasis present within the cortical gray matter of the posterior left frontal parietal region (series 11, image 29). There is a tiny 2 mm metastasis within the left cerebellar hemisphere (series 11, image 9). Additional tiny 2 mm mass seen more superiorly within the left cerebellar hemisphere (series 11, image 13). No other definite intracranial metastases seen on post-contrast sequences. There are is a round 3 mm focus of restricted diffusion within mean right frontal lobe, seen on series 3, image 32, which may reflect a small/occult metastasis as well, as no definite subarachnoid hemorrhage or infarct is seen associated with this lesion. There is an additional 3 mm metastasis within the peripheral right frontal lobe (series 12, image 17). This is seen in the region of the acute subarachnoid hemorrhage.  No midline shift or significant mass effect. No hydrocephalus. No extra-axial fluid collection.  Craniocervical junction within normal limits. Pituitary gland normal.  No acute abnormality about the orbits.  Scattered opacity present within the ethmoidal air cells, right greater than left. Small amount of mucosal thickening present within level left maxillary sinus. No mastoid effusion. Inner ear structures are normal. Normal intravascular flow voids are maintained.  Bone marrow signal intensity within normal limits. No scalp soft tissue abnormality.  Dedicated MRV images demonstrate no filling defect to suggest venous sinus thrombosis. Superior sagittal sinus, transverse sinuses, and sigmoid sinuses are widely patent. Visualize internal jugular veins are widely patent. Deep venous structures patent.  IMPRESSION: 1. 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 No  significant mass effect or associated hemorrhage. 2. Additional subcentimeter focus of restricted diffusion adjacent to the atrium of the right lateral ventricle, also suspicious for small acute ischemic infarct. 3. Stable volume and distribution of acute subarachnoid hemorrhage within the right frontal region as compared to  prior CT. No new intracranial hemorrhage. 4. 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 One of these lesions lies in close proximity to the acute subarachnoid hemorrhage within the right frontal lobe, raising the suspicion that this is the source of bleeding. 5. Normal MRV of the brain.   Electronically Signed   By: Jeannine Boga M.D.   On: 11/27/2014 06:38   [name]  11/27/2014   CLINICAL DATA:  81 or old male with history of newly diagnosed left lung cancer, presented with sudden onset expressive aphasia, found have acute subarachnoid hemorrhage.  EXAM: MRI HEAD WITHOUT AND WITH CONTRAST  MRV HEAD WITHOUT CONTRAST  TECHNIQUE: Multiplanar, multiecho pulse sequences of the brain and surrounding structures were obtained without and with intravenous contrast. Angiographic images of the intracranial venous structures were obtained using MRV technique without intravenous contrast.  Multiplanar multi sequence images of the brain were performed with and without the administration of IV gadolinium. Additional dedicated MRV images were obtained.  CONTRAST:  59mL MULTIHANCE GADOBENATE DIMEGLUMINE 529 MG/ML IV SOLN  COMPARISON:  Prior CT from 12/04/2014 as well as MRI from 09/22/2014.  FINDINGS: Cerebral volume within normal limits for patient age. Minimal patchy T2/FLAIR hyperintensity present within the periventricular and deep white matter both cerebral hemispheres, likely related to very mild chronic small vessel ischemic changes.  Serpiginous  hyperintense FLAIR signal intensity seen layering within cortical sulci within the posterior right frontal lobe is consistent with acute subarachnoid hemorrhage. Overall, this is grossly stable in volume and distribution as compared to previous CT. No new hemorrhage identified. There is associated restricted diffusion with this subarachnoid hemorrhage.  There is abnormal restricted diffusion involving the cortical gray matter of the left frontotemporal region, consistent with acute ischemic infarct (series 3, image 21). This involves the posterior left insular cortex. No associated hemorrhage or significant mass effect. Additional small cortical infarct more posteriorly within the posterior left frontal region (series 3, image 23). Additional tiny cortical infarct more superiorly within knee postcentral gyrus of the left parietal lobe (series 3, image 37, 34).  Tiny focus of restricted diffusion adjacent to the atrium of the right lateral ventricle noted as well (series 3, image 24). Finding also may reflect a small acute ischemic infarct.  On post-contrast sequences, there is a tiny ring enhancing 4 mm lesion in the region of the right insula, compatible with a small intracranial metastasis (series 11, image 28). Additional 3 mm metastasis present within the cortical gray matter of the posterior left frontal parietal region (series 11, image 29). There is a tiny 2 mm metastasis within the left cerebellar hemisphere (series 11, image 9). Additional tiny 2 mm mass seen more superiorly within the left cerebellar hemisphere (series 11, image 13). No other definite intracranial metastases seen on post-contrast sequences. There are is a round 3 mm focus of restricted diffusion within mean right frontal lobe, seen on series 3, image 32, which may reflect a small/occult metastasis as well, as no definite subarachnoid hemorrhage or infarct is seen associated with this lesion. There is an additional 3 mm metastasis within  the peripheral right frontal lobe (series 12, image 17). This is seen in the region of the acute subarachnoid hemorrhage.  No midline shift or significant mass effect. No hydrocephalus. No extra-axial fluid collection.  Craniocervical junction within normal limits. Pituitary gland normal.  No acute abnormality about the orbits.  Scattered opacity present within the ethmoidal air cells, right greater than left. Small amount of mucosal thickening present within level left maxillary sinus. No mastoid effusion. Inner ear structures are normal. Normal intravascular flow voids are maintained.  Bone marrow signal intensity within normal limits. No scalp soft tissue abnormality.  Dedicated MRV images demonstrate no filling defect to suggest venous sinus thrombosis. Superior sagittal sinus, transverse sinuses, and sigmoid sinuses are widely patent. Visualize internal jugular veins are widely patent. Deep venous structures patent.  IMPRESSION: 1. 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 No significant mass effect or associated hemorrhage. 2. Additional subcentimeter focus of restricted diffusion adjacent to the atrium of the right lateral ventricle, also suspicious for small acute ischemic infarct. 3. Stable volume and distribution of acute subarachnoid hemorrhage within the right frontal region as compared to prior CT. No new intracranial hemorrhage. 4. 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 One of these lesions lies in close proximity to the acute subarachnoid hemorrhage within the right frontal lobe, raising the suspicion that this is the source of bleeding. 5. Normal MRV of the brain.   Electronically Signed   By: Jeannine Boga M.D.   On: 11/27/2014 06:38   Ir Ivc Filter Plmt / S&i /img Guid/mod Sed  11/27/2014   CLINICAL  DATA:  Pulmonary embolus, subarachnoid hemorrhage, patient can no longer be anticoagulated  EXAM: ULTRASOUND GUIDANCE FOR VASCULAR ACCESS  IVC CATHETERIZATION AND VENOGRAM  IVC FILTER INSERTION  Date:  5/3/20165/09/2014 12:12 pm  Radiologist:  M. Daryll Brod, MD  Guidance:  Ultrasound and fluoroscopic  FLUOROSCOPY TIME:  1 minutes 6 seconds, 25.55 mGy  MEDICATIONS AND MEDICAL HISTORY: 1 mg Versed, 50 mcg fentanyl  ANESTHESIA/SEDATION: 10 minutes  CONTRAST:  59mL OMNIPAQUE IOHEXOL 300 MG/ML  SOLN  COMPLICATIONS: None immediate  PROCEDURE: Informed consent was obtained from the patient following explanation of the procedure, risks, benefits and alternatives. The patient understands, agrees and consents for the procedure. All questions were addressed. A time out was performed.  Maximal barrier sterile technique utilized including caps, mask, sterile gowns, sterile gloves, large sterile drape, hand hygiene, and betadine prep.  Under sterile condition and local anesthesia, right internal jugular venous access was performed with ultrasound. Over a guide wire, the IVC filter delivery sheath and inner dilator were advanced into the IVC just above the IVC bifurcation. Contrast injection was performed for an IVC venogram.  IVC VENOGRAM: The IVC is patent. No evidence of thrombus, stenosis, or occlusion. No variant venous anatomy. The renal veins are identified at L1-2.  IVC FILTER INSERTION: Through the delivery sheath, the Bard Denali IVC filter was deployed in the infrarenal IVC at the L2-3 level just below the renal veins and above the IVC bifurcation. Contrast injection confirmed position. There is good apposition of the filter against the IVC.  The delivery sheath was removed and hemostasis was obtained with compression for 5 minutes. The patient tolerated the procedure well. No immediate complications.  IMPRESSION: Ultrasound and fluoroscopically guided infrarenal IVC filter insertion.   Electronically Signed   By: Jerilynn Mages.   Shick M.D.   On: 11/27/2014 13:00   Mr Shoulder Left Wo Contrast  11/29/2014   CLINICAL DATA:  Metastatic lung cancer with left shoulder pain.  EXAM: MRI OF THE LEFT SHOULDER WITHOUT CONTRAST  TECHNIQUE: Multiplanar, multisequence MR imaging of the shoulder was  performed. No intravenous contrast was administered.  COMPARISON:  Left shoulder radiographs 11/27/2014  FINDINGS: Rotator cuff: Significant supraspinatus tendinopathy/ tendinosis with probable intrasubstance tears. There is also a shallow bursal surface tear. No full thickness retracted tear. The infraspinatus subscapularis tendons are intact.  Muscles:  Normal.  Biceps long head:  Intact.  Acromioclavicular Joint: Mild degenerative changes. The acromion is type 1-2 in shape. No significant lateral downsloping or undersurface spurring  Glenohumeral Joint: Mild degenerative changes. No joint effusion. Significant synovitis or adhesive capsulitis.  Labrum:  Grossly intact.  Bones: No acute bony findings. No evidence of osseous metastatic disease.  Other:  Mild subacromial/subdeltoid bursitis.  IMPRESSION: 1. No MR findings to suggest osseous metastatic disease involving the shoulder. 2. Significant supraspinatus tendinopathy/tendinosis with interstitial tears and a shallow bursal surface tear. 3. Intact biceps tendon and grossly normal glenoid labrum. 4. Significant glenohumeral joint synovitis or adhesive capsulitis. 5. Mild subacromial/subdeltoid bursitis.   Electronically Signed   By: Marijo Sanes M.D.   On: 11/29/2014 12:38   Dg Chest Port 1 View  11/27/2014   CLINICAL DATA:  Hypertension.  Evaluate for metastatic disease.  EXAM: PORTABLE CHEST - 1 VIEW  COMPARISON:  12/17/2014.  PET-CT 10/02/2014.  CT chest 09/21/2014.  FINDINGS: Stable changes of mediastinal and left hilar adenopathy. Persistent ill-defined density left upper lobe. No pleural effusion or pneumothorax. Heart size stable. No pneumothorax. No acute osseus abnormality.  IMPRESSION: 1.  Persistent mediastinal and left hilar adenopathy. 2. Persistent ill-defined density left upper lobe . No new lesions identified.   Electronically Signed   By: Marcello Moores  Register   On: 11/27/2014 07:20   Dg Abd Portable 1v  11/27/2014   CLINICAL DATA:  54 year old male with vomiting and nausea  EXAM: PORTABLE ABDOMEN - 1 VIEW  COMPARISON:  PET-CT 10/02/2014  FINDINGS: Normal, nonobstructed bowel gas pattern. No evidence organomegaly or suspicious calcification. Bard [place] potentially retrievable IVC filter in the expected location.  IMPRESSION: Negative.  Potentially retrievable IVC filter in the expected position.   Electronically Signed   By: Jacqulynn Cadet M.D.   On: 11/27/2014 18:58    ASSESSMENT/PLAN:    Non-small cell carcinoma of lung, stage 3 Patient recently diagnosed with lung cancer.  Presented to the [facility] today to initiate his first cycle of carboplatin/paclitaxel chemotherapy.  However, prior to initiating his chemotherapy.  Patient and his wife both report.  Patient has been experiencing some intermittent left sided weakness.  He is specifically complaining some numbness to the left side of his face and his left tongue as well.  He reports some numbness and tingling to his left arm and leg as well.  He states that he first noticed the symptoms last night; and they re-occurred again this morning.  Patient denies any numbness or tingling at this present time.  Patient also complains of some intermittent expressive aphasia since last night as well.  On exam.-Patient appears neurologically intact; with no obvious weakness and bilateral grips strong and equal.  Decision was made to hold initiation of chemotherapy; and to transfer patient to the emergency department for further evaluation and management.  Brief history and report were called to the emergency department charge nurse prior to transferring patient to the emergency department via wheelchair with O2 via nasal cannula per  the cancer Center nurse.  Patient is presently scheduled to return on Dec 13, 2014 for labs, follow up visit, and chemotherapy.  However, will most likely need to reschedule all due to patient's hospitalization.  Anemia in neoplastic disease Patient's hemoglobin has decreased from 11.7 down to 8.6.  Patient  Denies any obvious signs of bleeding.  Will transfer patient to the emergency department for further evaluation and management.   Acute focal neurological deficit Patient recently diagnosed with lung cancer.  Presented to the [facility] today to initiate his first cycle of carboplatin/paclitaxel chemotherapy.  However, prior to initiating his chemotherapy.  Patient and his wife both report.  Patient has been experiencing some intermittent left sided weakness.  He is specifically complaining some numbness to the left side of his face and his left tongue as well.  He reports some numbness and tingling to his left arm and leg as well.  He states that he first noticed the symptoms last night; and they re-occurred again this morning.  Patient denies any numbness or tingling at this present time.  Patient also complains of some intermittent expressive aphasia since last night as well.  On exam.-Patient appears neurologically intact; with no obvious weakness and bilateral grips strong and equal.  Decision was made to hold initiation of chemotherapy; and to transfer patient to the emergency department for further evaluation and management.  Brief history and report were called to the emergency department charge nurse prior to transferring patient to the emergency department via wheelchair with O2 via nasal cannula per the cancer Center nurse.      Long term current use of anticoagulant therapy Patient has a History of pulmonary embolism; and continues to take Lovenox as directed.   Patient stated understanding of all instructions; and was in agreement with this plan of care. The patient  knows to call the clinic with any problems, questions or concerns.   Review/collaboration with Dr. Julien Nordmann regarding all aspects of patient's visit today.   Total time spent with patient was 40 minutes;  with greater than 75 percent of that time spent in face to face counseling regarding patient's symptoms,  and coordination of care and follow up.  Disclaimer: This note was dictated with voice recognition software. Similar sounding words can inadvertently be transcribed and may not be corrected upon review.   Drue Second, NP 11/29/2014

## 2014-11-29 NOTE — Progress Notes (Signed)
*  PRELIMINARY RESULTS* Vascular Ultrasound Carotid Duplex (Doppler) has been completed.   Study was technically limited and difficult due to poor patient cooperation. Findings suggest 1-39% internal carotid artery stenosis bilaterally. Vertebral arteries are patent with antegrade flow.  11/29/2014 3:17 PM Maudry Mayhew, RVT, RDCS, RDMS

## 2014-11-29 NOTE — Assessment & Plan Note (Signed)
Patient recently diagnosed with lung cancer.  Presented to the [facility] today to initiate his first cycle of carboplatin/paclitaxel chemotherapy.  However, prior to initiating his chemotherapy.  Patient and his wife both report.  Patient has been experiencing some intermittent left sided weakness.  He is specifically complaining some numbness to the left side of his face and his left tongue as well.  He reports some numbness and tingling to his left arm and leg as well.  He states that he first noticed the symptoms last night; and they re-occurred again this morning.  Patient denies any numbness or tingling at this present time.  Patient also complains of some intermittent expressive aphasia since last night as well.  On exam.-Patient appears neurologically intact; with no obvious weakness and bilateral grips strong and equal.  Decision was made to hold initiation of chemotherapy; and to transfer patient to the emergency department for further evaluation and management.  Brief history and report were called to the emergency department charge nurse prior to transferring patient to the emergency department via wheelchair with O2 via nasal cannula per the cancer Center nurse.

## 2014-11-29 NOTE — Progress Notes (Signed)
Dr. Ree Kida of unsuccessful attempt at MRI and increasing confusion. No new orders at this time. Will continue to monitor closely.

## 2014-11-29 NOTE — Progress Notes (Signed)
0635 paged Dr. Aram Beecham regarding CT results called from radiology. No new orders given at this time. Will continue to monitor.

## 2014-11-29 NOTE — Progress Notes (Signed)
Subjective: Patient without complaints, resting comfortably in bed. Head CT of the brain contrast this morning that demonstrates areas of stroke in left hemisphere, corresponding to the stroke that he suffered 3 days ago. The small area of non-aneurysmal subarachnoid hemorrhage within the sulcus in the right hemisphere has cleared substantially.  Objective: Vital signs in last 24 hours: Filed Vitals:   11/29/14 0400 11/29/14 0500 11/29/14 0600 11/29/14 0700  BP: 138/73 131/84 144/90 150/78  Pulse: 84 79 89 85  Temp: 98.2 F (36.8 C)     TempSrc: Oral     Resp: '15 10 11 18  '$ Height:      Weight:  71.4 kg (157 lb 6.5 oz)    SpO2: 99% 100% 99% 97%    Intake/Output from previous day: 05/04 0701 - 05/05 0700 In: 1090 [P.O.:780; I.V.:260; IV Piggyback:50] Out: 900 [Urine:900] Intake/Output this shift:    Physical Exam:  Awake and alert, oriented to name, [facility] hospital, and May 2016. Speech fluent. Following commands. Pupils equal round reactive to light and about 3 mm bilaterally. EOMI. Face symmetrical. Moving all 4 extremities well. No drift of upper extremities or lower extremities.  CBC  Recent Labs  11/27/14 1455 11/28/14 0235  WBC 16.1* 18.5*  HGB 7.8* 8.0*  HCT 23.5* 23.6*  PLT 382 376   BMET  Recent Labs  11/27/14 1228 11/28/14 0235  NA 135 133*  K 3.7 4.2  CL 102 100*  CO2 22 22  GLUCOSE 115* 143*  BUN 17 15  CREATININE 0.84 0.82  CALCIUM 9.1 9.4    Studies/Results: Dg Shoulder 1v Left  11/27/2014   CLINICAL DATA:  54 year old male with left shoulder pain. Known stage IIIB lung cancer. Evaluate for metastasis.  EXAM: LEFT SHOULDER - 1 VIEW  COMPARISON:  Prior PET-CT 10/02/2014  FINDINGS: There is no evidence of fracture or dislocation. There is no evidence of arthropathy or other focal bone abnormality. No lytic or blastic osseous lesion. Soft tissues are unremarkable.  IMPRESSION: Negative.   Electronically Signed   By: Jacqulynn Cadet M.D.   On:  11/27/2014 18:17   Ct Head Wo Contrast  11/29/2014   CLINICAL DATA:  Followup subarachnoid hemorrhage, history of metastatic lung cancer.  EXAM: CT HEAD WITHOUT CONTRAST  TECHNIQUE: Contiguous axial images were obtained from the base of the skull through the vertex without intravenous contrast.  COMPARISON:  MRI of the brain Nov 27, 2014 and CT of the head Nov 26, 2014  FINDINGS: Faint residual subarachnoid blood and RIGHT central sulcus. No intraparenchymal hemorrhage, mass effect or midline shift. New focal LEFT frontoparietal wedge-like hypodensities. Ventricles and sulci are normal for patient's age. New  No abnormal extra-axial fluid collections. Moderate calcific atherosclerosis of the carotid siphons. Ocular globes and orbital contents are unremarkable. Moderate ethmoid mucosal thickening without paranasal sinus air-fluid levels. The mastoid air cells are well aerated. Soft tissue within the RIGHT external auditory canal most consistent with cerumen. No skull fracture.  IMPRESSION: New patchy hypodensities LEFT cerebrum concerning for acute middle cerebral artery territory infarcts, further propagation from prior MRI.  Small amount of residual degenerating RIGHT frontal subarachnoid hemorrhage.  Acute findings discussed with and reconfirmed by Ander Purpura, RN Neuro ICU on 11/29/2014 at 6:25 am.   Electronically Signed   By: Elon Alas   On: 11/29/2014 06:25   Ir Ivc Filter Plmt / S&i /img Guid/mod Sed  11/27/2014   CLINICAL DATA:  Pulmonary embolus, subarachnoid hemorrhage, patient can no longer be anticoagulated  EXAM: ULTRASOUND GUIDANCE FOR VASCULAR ACCESS  IVC CATHETERIZATION AND VENOGRAM  IVC FILTER INSERTION  Date:  5/3/20165/09/2014 12:12 pm  Radiologist:  M. Daryll Brod, MD  Guidance:  Ultrasound and fluoroscopic  FLUOROSCOPY TIME:  1 minutes 6 seconds, 25.55 mGy  MEDICATIONS AND MEDICAL HISTORY: 1 mg Versed, 50 mcg fentanyl  ANESTHESIA/SEDATION: 10 minutes  CONTRAST:  14m OMNIPAQUE IOHEXOL 300  MG/ML  SOLN  COMPLICATIONS: None immediate  PROCEDURE: Informed consent was obtained from the patient following explanation of the procedure, risks, benefits and alternatives. The patient understands, agrees and consents for the procedure. All questions were addressed. A time out was performed.  Maximal barrier sterile technique utilized including caps, mask, sterile gowns, sterile gloves, large sterile drape, hand hygiene, and betadine prep.  Under sterile condition and local anesthesia, right internal jugular venous access was performed with ultrasound. Over a guide wire, the IVC filter delivery sheath and inner dilator were advanced into the IVC just above the IVC bifurcation. Contrast injection was performed for an IVC venogram.  IVC VENOGRAM: The IVC is patent. No evidence of thrombus, stenosis, or occlusion. No variant venous anatomy. The renal veins are identified at L1-2.  IVC FILTER INSERTION: Through the delivery sheath, the Bard Denali IVC filter was deployed in the infrarenal IVC at the L2-3 level just below the renal veins and above the IVC bifurcation. Contrast injection confirmed position. There is good apposition of the filter against the IVC.  The delivery sheath was removed and hemostasis was obtained with compression for 5 minutes. The patient tolerated the procedure well. No immediate complications.  IMPRESSION: Ultrasound and fluoroscopically guided infrarenal IVC filter insertion.   Electronically Signed   By: MJerilynn Mages  Shick M.D.   On: 11/27/2014 13:00   Dg Abd Portable 1v  11/27/2014   CLINICAL DATA:  54year old male with vomiting and nausea  EXAM: PORTABLE ABDOMEN - 1 VIEW  COMPARISON:  PET-CT 10/02/2014  FINDINGS: Normal, nonobstructed bowel gas pattern. No evidence organomegaly or suspicious calcification. Bard DDe Mottepotentially retrievable IVC filter in the expected location.  IMPRESSION: Negative.  Potentially retrievable IVC filter in the expected position.   Electronically Signed   By:  HJacqulynn CadetM.D.   On: 11/27/2014 18:58    Assessment/Plan: Patient neurologically stable. Awaiting 3T SRS protocol MRI of brain without and with gadolinium.   NHosie Spangle MD 11/29/2014, 7:41 AM

## 2014-11-30 ENCOUNTER — Ambulatory Visit: Payer: 59

## 2014-11-30 ENCOUNTER — Ambulatory Visit: Payer: 59 | Admitting: Radiation Oncology

## 2014-11-30 ENCOUNTER — Ambulatory Visit: Admit: 2014-11-30 | Payer: 59 | Admitting: Radiation Oncology

## 2014-11-30 ENCOUNTER — Inpatient Hospital Stay (HOSPITAL_COMMUNITY): Payer: 59

## 2014-11-30 ENCOUNTER — Other Ambulatory Visit: Payer: Self-pay

## 2014-11-30 ENCOUNTER — Other Ambulatory Visit: Payer: Self-pay | Admitting: Medical Oncology

## 2014-11-30 ENCOUNTER — Other Ambulatory Visit (HOSPITAL_COMMUNITY): Payer: 59

## 2014-11-30 ENCOUNTER — Inpatient Hospital Stay (HOSPITAL_COMMUNITY): Payer: 59 | Admitting: Anesthesiology

## 2014-11-30 ENCOUNTER — Encounter (HOSPITAL_COMMUNITY): Payer: Self-pay

## 2014-11-30 ENCOUNTER — Telehealth: Payer: Self-pay | Admitting: *Deleted

## 2014-11-30 DIAGNOSIS — I639 Cerebral infarction, unspecified: Secondary | ICD-10-CM

## 2014-11-30 LAB — CBC
HCT: 22.5 % — ABNORMAL LOW (ref 39.0–52.0)
Hemoglobin: 7.6 g/dL — ABNORMAL LOW (ref 13.0–17.0)
MCH: 31.3 pg (ref 26.0–34.0)
MCHC: 33.8 g/dL (ref 30.0–36.0)
MCV: 92.6 fL (ref 78.0–100.0)
Platelets: 270 K/uL (ref 150–400)
RBC: 2.43 MIL/uL — ABNORMAL LOW (ref 4.22–5.81)
RDW: 14.1 % (ref 11.5–15.5)
WBC: 22.4 K/uL — ABNORMAL HIGH (ref 4.0–10.5)

## 2014-11-30 LAB — BLOOD GAS, ARTERIAL
Acid-base deficit: 4 mmol/L — ABNORMAL HIGH (ref 0.0–2.0)
Bicarbonate: 19.7 mEq/L — ABNORMAL LOW (ref 20.0–24.0)
Drawn by: 437071
FIO2: 1 %
MECHVT: 570 mL
O2 Saturation: 100 %
PEEP: 5 cmH2O
PH ART: 7.417 (ref 7.350–7.450)
Patient temperature: 98.6
RATE: 14 resp/min
TCO2: 20.7 mmol/L (ref 0–100)
pCO2 arterial: 31.2 mmHg — ABNORMAL LOW (ref 35.0–45.0)
pO2, Arterial: 357 mmHg — ABNORMAL HIGH (ref 80.0–100.0)

## 2014-11-30 LAB — HEPARIN LEVEL (UNFRACTIONATED): Heparin Unfractionated: 0.17 IU/mL — ABNORMAL LOW (ref 0.30–0.70)

## 2014-11-30 LAB — TROPONIN I
TROPONIN I: 19.18 ng/mL — AB (ref <0.031)
TROPONIN I: 33.68 ng/mL — AB (ref <0.031)
Troponin I: 19.02 ng/mL (ref <0.031)
Troponin I: 38.35 ng/mL (ref <0.031)

## 2014-11-30 LAB — BASIC METABOLIC PANEL
Anion gap: 13 (ref 5–15)
BUN: 14 mg/dL (ref 6–20)
CO2: 21 mmol/L — ABNORMAL LOW (ref 22–32)
Calcium: 9.1 mg/dL (ref 8.9–10.3)
Chloride: 99 mmol/L — ABNORMAL LOW (ref 101–111)
Creatinine, Ser: 0.84 mg/dL (ref 0.61–1.24)
GFR calc Af Amer: >60 mL/min (ref >60)
Glucose, Bld: 128 mg/dL — ABNORMAL HIGH (ref 70–99)
Potassium: 3.5 mmol/L (ref 3.5–5.1)
Sodium: 133 mmol/L — ABNORMAL LOW (ref 135–145)

## 2014-11-30 LAB — PHOSPHORUS: PHOSPHORUS: 3 mg/dL (ref 2.5–4.6)

## 2014-11-30 LAB — HEMOGLOBIN AND HEMATOCRIT, BLOOD
HEMATOCRIT: 21.5 % — AB (ref 39.0–52.0)
Hemoglobin: 7.5 g/dL — ABNORMAL LOW (ref 13.0–17.0)

## 2014-11-30 LAB — MAGNESIUM: Magnesium: 2 mg/dL (ref 1.7–2.4)

## 2014-11-30 MED ORDER — HALOPERIDOL LACTATE 5 MG/ML IJ SOLN
INTRAMUSCULAR | Status: AC
  Filled 2014-11-30: qty 1

## 2014-11-30 MED ORDER — ASPIRIN 325 MG PO TABS
325.0000 mg | ORAL_TABLET | Freq: Every day | ORAL | Status: DC
  Administered 2014-11-30 – 2014-12-01 (×2): 325 mg
  Filled 2014-11-30 (×3): qty 1

## 2014-11-30 MED ORDER — LORAZEPAM 2 MG/ML IJ SOLN
INTRAMUSCULAR | Status: AC
  Filled 2014-11-30: qty 1

## 2014-11-30 MED ORDER — PANTOPRAZOLE SODIUM 40 MG IV SOLR
40.0000 mg | Freq: Every day | INTRAVENOUS | Status: DC
  Administered 2014-11-30 – 2014-12-01 (×2): 40 mg via INTRAVENOUS
  Filled 2014-11-30 (×2): qty 40

## 2014-11-30 MED ORDER — FENTANYL CITRATE (PF) 100 MCG/2ML IJ SOLN
50.0000 ug | Freq: Once | INTRAMUSCULAR | Status: AC
  Administered 2014-11-30: 50 ug via INTRAVENOUS
  Filled 2014-11-30: qty 2

## 2014-11-30 MED ORDER — LORAZEPAM 2 MG/ML IJ SOLN: 4.0000 mg | INTRAMUSCULAR | Status: DC

## 2014-11-30 MED ORDER — LORAZEPAM 2 MG/ML IJ SOLN
1.0000 mg | Freq: Once | INTRAMUSCULAR | Status: AC
  Administered 2014-11-30: 1 mg via INTRAVENOUS

## 2014-11-30 MED ORDER — MIDAZOLAM HCL 2 MG/2ML IJ SOLN: 2.0000 mg | INTRAMUSCULAR | Status: DC | PRN

## 2014-11-30 MED ORDER — HALOPERIDOL LACTATE 5 MG/ML IJ SOLN
5.0000 mg | Freq: Once | INTRAMUSCULAR | Status: AC
  Administered 2014-11-30: 5 mg via INTRAMUSCULAR

## 2014-11-30 MED ORDER — LORAZEPAM 2 MG/ML IJ SOLN
INTRAMUSCULAR | Status: AC
  Administered 2014-11-30: 2 mg
  Filled 2014-11-30: qty 1

## 2014-11-30 MED ORDER — VITAL HIGH PROTEIN PO LIQD: 1000.0000 mL | ORAL | Status: DC

## 2014-11-30 MED ORDER — CETYLPYRIDINIUM CHLORIDE 0.05 % MT LIQD
7.0000 mL | Freq: Four times a day (QID) | OROMUCOSAL | Status: DC
  Administered 2014-11-30 – 2014-12-03 (×12): 7 mL via OROMUCOSAL

## 2014-11-30 MED ORDER — HEPARIN (PORCINE) IN NACL 100-0.45 UNIT/ML-% IJ SOLN
1200.0000 [IU]/h | INTRAMUSCULAR | Status: DC
  Administered 2014-11-30: 850 [IU]/h via INTRAVENOUS
  Administered 2014-12-01: 1200 [IU]/h via INTRAVENOUS
  Filled 2014-11-30 (×3): qty 250

## 2014-11-30 MED ORDER — VITAL AF 1.2 CAL PO LIQD
1000.0000 mL | ORAL | Status: DC
  Administered 2014-11-30: 1000 mL
  Filled 2014-11-30 (×6): qty 1000

## 2014-11-30 MED ORDER — FENTANYL CITRATE (PF) 2500 MCG/50ML IJ SOLN
25.0000 ug/h | INTRAMUSCULAR | Status: DC
  Administered 2014-11-30: 200 ug/h via INTRAVENOUS
  Administered 2014-12-01: 50 ug/h via INTRAVENOUS
  Administered 2014-12-01 – 2014-12-02 (×2): 100 ug/h via INTRAVENOUS
  Filled 2014-11-30 (×3): qty 50

## 2014-11-30 MED ORDER — METOPROLOL TARTRATE 1 MG/ML IV SOLN: 5.0000 mg | Freq: Once | INTRAVENOUS | Status: DC

## 2014-11-30 MED ORDER — FENTANYL BOLUS VIA INFUSION
50.0000 ug | INTRAVENOUS | Status: DC | PRN
  Filled 2014-11-30: qty 50

## 2014-11-30 MED ORDER — PROPOFOL 10 MG/ML IV BOLUS
INTRAVENOUS | Status: DC | PRN
  Administered 2014-11-30: 50 mg via INTRAVENOUS

## 2014-11-30 MED ORDER — SUCCINYLCHOLINE CHLORIDE 20 MG/ML IJ SOLN
INTRAMUSCULAR | Status: DC | PRN
Start: 2014-11-30 — End: 2014-11-30
  Administered 2014-11-30: 60 mg via INTRAVENOUS

## 2014-11-30 MED ORDER — MIDAZOLAM HCL 2 MG/2ML IJ SOLN
2.0000 mg | INTRAMUSCULAR | Status: DC | PRN
Start: 2014-11-30 — End: 2014-12-03
  Administered 2014-12-01 (×6): 2 mg via INTRAVENOUS
  Filled 2014-11-30 (×6): qty 2

## 2014-11-30 MED ORDER — CHLORHEXIDINE GLUCONATE 0.12 % MT SOLN
15.0000 mL | Freq: Two times a day (BID) | OROMUCOSAL | Status: DC
  Administered 2014-11-30 – 2014-12-02 (×6): 15 mL via OROMUCOSAL
  Filled 2014-11-30 (×7): qty 15

## 2014-11-30 NOTE — Progress Notes (Signed)
[facility] Progress Note Patient Name: [name] DOB: 22-Oct-1960 MRN: [id number]   Date of Service  11/30/2014  HPI/Events of Note  Note follow up troponin ordered 5/5 confirms NSTEMI. Not in a position to start heparin at this time given head bleed. Will order another troponin for this am to determine peak.   eICU Interventions       Intervention Category Intermediate Interventions: Other:  Lashanna Angelo S. 11/30/2014, 5:04 AM

## 2014-11-30 NOTE — Progress Notes (Signed)
UR completed.  Pt with new medical issues.  Will continue to follow for any discharge needs.   Sandi Mariscal, RN BSN [facility] CCM Trauma/Neuro ICU Case Manager [phone]

## 2014-11-30 NOTE — Progress Notes (Signed)
Cancelled [facility] transport for simulation at [facility] rad onc with Abbe Amsterdam at [employer] .

## 2014-11-30 NOTE — Progress Notes (Signed)
Called by patient's nurse regarding extreme agitation, unresponsiveness, and worsening left sided weakness. Patient received 2 mg IV ativan but remained agitated and pull out his IV. Proceed to give 5 mg IM haldol to no avail. Able to get IV access and additional 2 mg IV ativan x 2 doses administered but unsuccessfully. Spoke with ICU attending on call and patient intubated and started on continuous sedation. Concern for a new cerebrovascular insult as patient is now plegic in the left side with AMS. STAT CT after intubation. NSTEMI on EKG. ICU team aware and following. Patient wife at the bedside and was updated about current grave situation.  Dorian Pod, MD

## 2014-11-30 NOTE — Progress Notes (Signed)
PULMONARY / CRITICAL CARE MEDICINE   Name: [name] MRN: [id number] DOB: 1960/08/07    ADMISSION DATE:  12/16/2014 CONSULTATION DATE:  11/30/2014  REFERRING MD :  EDP  CHIEF COMPLAINT:  Speech difficulties  INITIAL PRESENTATION:   [name] is a 55 y.o. M with PMH as outlined below including recent diagnosis of lung CA (diagnosed through biopsy via EBUS and mediastinoscopy (11/05/14 - Dr. Roxan Hockey), as well as PE diagnosed in Feb 2016 (on lovenox).  Back in February, pt noted swelling of his LE's and was awaiting venous doppler; however, went on to develop headache and SOB as well as left shoulder pain.  He was seen in ED where he hat CTA done on 09/21/14.  This revealed acute PE in RUL as well as a couple of pulmonary nodules concerning for malignancy.  MRI brain performed 2/27 was negative for mets to the brain.  PET scan performed 10/22/14 showed hypermetabolic lesions in mediastinum, paratracheal, sub-carinal, and hilar lymph nodes with left lung cancer.  This is what prompted referral to Dr. Roxan Hockey followed by EBUS / mediastinoscopy.  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  He was scheduled for initiation of chemoradiation on 12/24/2014.  Upon arrival to cancer center that afternoon, he had acute speech difficulties.  He was sent to the ED for further evaluation.  CT of the brain revealed a small amount of sulcal SAH over the right hemisphere.  He was subsequently transferred to [facility] for further evaluation.  He was seen in consultation by neurosurgery (Dr. Sherwood Gambler) and it was felt that his spontaneous SAH was most likely related to his antiplatelet therapy and anticoagulation given that it was not in the typical distribution of a aneurysmal SAH.  They also did recommend repeating brain MRI given that this could be related to brain mets or paraneoplastic syndrome.  Antiplatelets and anticoagulation were stopped and reversed (21m protamine,  PCCM was  consulted for medical management.  STUDIES:  CT head 5/2 >>> acute SAH within sulcus at the right parietal lobe. CXR 5/2 >>> unchanged LUL spiculated nodule, right peritracheal and left hilar adenopathy. [facility] Somewhat anxious but in good spirits given his circumstances. S/p IVC filter +  SUBJECTIVE/OVERNIGHT/INTERVAL HX 5/6 restroke overnight and new MI requiring intubation overnight.  VITAL SIGNS: Temp:  [98 F (36.7 C)-99.8 F (37.7 C)] 99.8 F (37.7 C) (05/06 0749) Pulse Rate:  [98-167] 118 (05/06 1000) Resp:  [7-33] 21 (05/06 1000) BP: (88-156)/(62-126) 112/77 mmHg (05/06 1000) SpO2:  [92 %-100 %] 98 % (05/06 1000) FiO2 (%):  [40 %-100 %] 40 % (05/06 0800) Weight:  [67.7 kg (149 lb 4 oz)] 67.7 kg (149 lb 4 oz) (05/06 0500) HEMODYNAMICS:   VENTILATOR SETTINGS: Vent Mode:  [-] PRVC FiO2 (%):  [40 %-100 %] 40 % Set Rate:  [14 bmp] 14 bmp Vt Set:  [570 mL-600 mL] 570 mL PEEP:  [5 cmH20] 5 cmH20 Plateau Pressure:  [15 cmH20-16 cmH20] 15 cmH20 INTAKE / OUTPUT: Intake/Output      05/05 0701 - 05/06 0700 05/06 0701 - 05/07 0700   P.O. 120    I.V. (mL/kg) 7.7 (0.1) 25 (0.4)   IV Piggyback 50    Total Intake(mL/kg) 177.7 (2.6) 25 (0.4)   Urine (mL/kg/hr) 700 (0.4)    Total Output 700     Net -522.3 +25        Urine Occurrence 1 x     PHYSICAL  EXAMINATION: General: Sedated, intubated, very thin, moves right side purposefully but not to command. Neuro: Unresponsive, moving right but not to command, reaching for ETT. HEENT: [place]/AT. PERRL, sclerae anicteric. Cardiovascular: RRR, no M/R/G.  Lungs: Intubated and sedated, synchronous with vent, coarse upper airway sounds noted. Abdomen: BS x 4, soft, NT/ND.  Musculoskeletal: No gross deformities, no edema.  Paralyzed on the left. Skin: Intact, warm, no rashes.  LABS:  PULMONARY  Recent Labs Lab 11/30/14 0730  PHART 7.417  PCO2ART 31.2*  PO2ART 357*  HCO3 19.7*  TCO2  20.7  O2SAT 100.0   CBC  Recent Labs Lab 11/27/14 1455 11/28/14 0235 11/30/14 0213 11/30/14 0919  HGB 7.8* 8.0* 7.6* 7.5*  HCT 23.5* 23.6* 22.5* 21.5*  WBC 16.1* 18.5* 22.4*  --   PLT 382 376 270  --    COAGULATION  Recent Labs Lab 11/28/2014 1535  INR 1.10   CARDIAC   Recent Labs Lab 11/27/14 0602 11/27/14 1228 11/29/14 0951 11/30/14 0742 11/30/14 0919  TROPONINI 0.62* 1.07* 14.88* 19.18* 19.02*   No results for input(s): PROBNP in the last 168 hours.  CHEMISTRY  Recent Labs Lab 12/10/2014 1236  11/28/2014 1535 11/27/14 1228 11/28/14 0235 11/30/14 0213  NA 140  --  140 135 133* 133*  K 4.2  < > 3.9 3.7 4.2 3.5  CL  --   --  106 102 100* 99*  CO2 23  --  _0 21*  GLUCOSE 100  --  103* 115* 143* 128*  BUN 20.8  --  24* _1 CREATININE 0.8  --  0.85 0.84 0.82 0.84  CALCIUM 9.7  --  9.8 9.1 9.4 9.1  MG  --   --   --  1.8 1.9 2.0  PHOS  --   --   --  3.2 4.2 3.0  < > = values in this interval not displayed. Estimated Creatinine Clearance: 98.5 mL/min (by C-G formula based on Cr of 0.84).  LIVER  Recent Labs Lab 11/29/2014 1236 12/23/2014 1535  AST 24 31  ALT 40 44  ALKPHOS 108 111  BILITOT <0.20 0.2*  PROT 7.2 7.8  ALBUMIN 3.6 4.3  INR  --  1.10   INFECTIOUS No results for input(s): LATICACIDVEN, PROCALCITON in the last 168 hours.  ENDOCRINE CBG (last 3)  No results for input(s): GLUCAP in the last 72 hours.  IMAGING x48h Ct Head Wo Contrast  11/30/2014   CLINICAL DATA:  Mental status changes. Worsening left facial droop and left-sided weakness  EXAM: CT HEAD WITHOUT CONTRAST  TECHNIQUE: Contiguous axial images were obtained from the base of the skull through the vertex without intravenous contrast.  COMPARISON:  MRI 11/29/2014.  CT 11/29/2014.  FINDINGS: There is no evidence of increasing subarachnoid hemorrhage. A small amount of subarachnoid hemorrhage remains visible on the sulci at the right frontoparietal vertex, but is becoming less  dense over time.  There is increasing low-density related to acute infarctions demonstrated yesterday by MRI. This is most notable in the right occipital lobe and in the left frontoparietal region, where the most sense of regions of infarctions were demonstrated. The occipital changes could represent actual progression on the right. Multiple small cortical and subcortical infarctions throughout both hemispheres shown by MRI are not specifically resolved by CT. Small metastatic lesions shown by MRI are not specifically result by CT.  The calvarium is unremarkable. There are mild mucosal inflammatory changes of the sinuses.  IMPRESSION: No new hemorrhage. Subarachnoid  hemorrhage in the sulci of the right frontoparietal region becoming less dense over time.  Evolutionary changes of infarctions in both hemispheres. Developing low-density becoming more noticeable particularly in the right occipital lobe and in the left frontoparietal region. The occipital changes on the right could represent actual progression.  Numerous other small infarctions shown by MRI are not specifically resolved.  Small brain metastases shown by MRI are not specifically resolved.   Electronically Signed   By: Nelson Chimes M.D.   On: 11/30/2014 07:31   Ct Head Wo Contrast  11/29/2014   CLINICAL DATA:  Followup subarachnoid hemorrhage, history of metastatic lung cancer.  EXAM: CT HEAD WITHOUT CONTRAST  TECHNIQUE: Contiguous axial images were obtained from the base of the skull through the vertex without intravenous contrast.  COMPARISON:  MRI of the brain Nov 27, 2014 and CT of the head Nov 26, 2014  FINDINGS: Faint residual subarachnoid blood and RIGHT central sulcus. No intraparenchymal hemorrhage, mass effect or midline shift. New focal LEFT frontoparietal wedge-like hypodensities. Ventricles and sulci are normal for patient's age. New  No abnormal extra-axial fluid collections. Moderate calcific atherosclerosis of the carotid siphons. Ocular  globes and orbital contents are unremarkable. Moderate ethmoid mucosal thickening without paranasal sinus air-fluid levels. The mastoid air cells are well aerated. Soft tissue within the RIGHT external auditory canal most consistent with cerumen. No skull fracture.  IMPRESSION: New patchy hypodensities LEFT cerebrum concerning for acute middle cerebral artery territory infarcts, further propagation from prior MRI.  Small amount of residual degenerating RIGHT frontal subarachnoid hemorrhage.  Acute findings discussed with and reconfirmed by Ander Purpura, RN Neuro ICU on 11/29/2014 at 6:25 am.   Electronically Signed   By: Elon Alas   On: 11/29/2014 06:25   Mr Brain Wo Contrast  11/29/2014   CLINICAL DATA:  Cerebral metastatic disease. Non-small-cell carcinoma lung  EXAM: MRI HEAD WITHOUT CONTRAST  TECHNIQUE: Multiplanar, multiecho pulse sequences of the brain and surrounding structures were obtained without intravenous contrast.  COMPARISON:  MRI head with contrast 11/27/2014  FINDINGS: Multiple areas of acute/ subacute infarction are present, with progression since the MRI of 11/27/2014. Left posterior insular infarct has extended into the left parietal lobe since the prior MRI.  Progression of acute small infarcts in the right frontal parietal lobe possibly due to watershed infarcts.  Small area of acute infarct in the right occipital lobe has developed since the prior study. Small acute cerebellar infarcts are new measuring several mm.  Ventricle size is normal.  No shift of the midline structures.  The patient was not able to complete the study and intravenous contrast was not administered. We contacted the nurse who told the MRI technologist that Dr. Rita Ohara did not wish to attempt further imaging with sedation and intravenous contrast today. Small enhancing metastatic deposits identified on the prior MRI are not visualized without intravenous contrast.  Mild subarachnoid hemorrhage on the right again noted.  No subdural fluid collection or midline shift.  IMPRESSION: Image quality degraded by motion. In addition, the patient was not able to tolerate postcontrast imaging due to pain.  Small enhancing metastatic deposits seen on the recent MRI of 11/27/2014 not visualized on today's study  Mild subarachnoid hemorrhage on the right unchanged  Progression of bilateral acute/subacute infarct since the recent MRI. See above description.  These results were called by telephone at the time of interpretation on 11/29/2014 at 1:34 pm to Dr. Leonie Man, who verbally acknowledged these results.   Electronically Signed   By:  Franchot Gallo M.D.   On: 11/29/2014 13:34   Mr Shoulder Left Wo Contrast  11/29/2014   CLINICAL DATA:  Metastatic lung cancer with left shoulder pain.  EXAM: MRI OF THE LEFT SHOULDER WITHOUT CONTRAST  TECHNIQUE: Multiplanar, multisequence MR imaging of the shoulder was performed. No intravenous contrast was administered.  COMPARISON:  Left shoulder radiographs 11/27/2014  FINDINGS: Rotator cuff: Significant supraspinatus tendinopathy/ tendinosis with probable intrasubstance tears. There is also a shallow bursal surface tear. No full thickness retracted tear. The infraspinatus subscapularis tendons are intact.  Muscles:  Normal.  Biceps long head:  Intact.  Acromioclavicular Joint: Mild degenerative changes. The acromion is type 1-2 in shape. No significant lateral downsloping or undersurface spurring  Glenohumeral Joint: Mild degenerative changes. No joint effusion. Significant synovitis or adhesive capsulitis.  Labrum:  Grossly intact.  Bones: No acute bony findings. No evidence of osseous metastatic disease.  Other:  Mild subacromial/subdeltoid bursitis.  IMPRESSION: 1. No MR findings to suggest osseous metastatic disease involving the shoulder. 2. Significant supraspinatus tendinopathy/tendinosis with interstitial tears and a shallow bursal surface tear. 3. Intact biceps tendon and grossly normal glenoid labrum.  4. Significant glenohumeral joint synovitis or adhesive capsulitis. 5. Mild subacromial/subdeltoid bursitis.   Electronically Signed   By: Marijo Sanes M.D.   On: 11/29/2014 12:38   Portable Chest Xray  11/30/2014   CLINICAL DATA:  Respiratory failure  EXAM: PORTABLE CHEST - 1 VIEW  COMPARISON:  11/27/2014  FINDINGS: The endotracheal tube is 3.4 cm above the carina. There is tubing superimposed on the left upper hemi thorax, unclear identity. There continues to be prominent right peritracheal contours and hilar contours consistent with adenopathy. No pneumothorax. No acute infiltrate or CHF. No large effusion.  IMPRESSION: Satisfactory ET tube position. No other significant interval change.   Electronically Signed   By: Andreas Newport M.D.   On: 11/30/2014 06:42   ASSESSMENT / PLAN:  NEUROLOGIC A:   Acute SAH  - likely due to antiplatelets and anticoagulation;  MRI reveals 3 mets  .- Rad onc 11/27/14 does not feel met and SAH related  - Neurosurgery does not feel patient is a surgical candidate.  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 P:   Neurosurgery and neurology following, appreciate input. Repeat  MRI on 12-05-14 new CVAs as above. Restart heparin per neuro's recommendations Continue outpatient lorazepam PRN.  PULMONARY A: Recently diagnosed adenocarcinoma stage 4- was scheduled to start chemoradiation 12/18/2014 Known PE - on lovenox Former smoker   - not in distress. Seen by rad-onc - recommended radiosurgery. S/o IVC filter yesterda 11/27/14 P:   Dr. Inda Merlin notified. Lovenox d/c'd given [facility]  Anticoag plan as above.  CARDIOVASCULAR A:  Hx HTN, HLD, MI, CAD s/p DES 2006 - on plavix and aspirin Elevated troponin ().19 > 0.32) - initial EKG reassuring NSTEMI  - echo done result pending P:  Plavix and ASA d/c'd given SAH. Continue outpatient lopressor, atorvastatin. Troponin positive spoke with cardiology Stanford Breed) who reviewed the  case and no cath or interventions other than beta blockers if BP tolerates are recommended at this time. Restart heparin per neuro. ASA by Thursday 11/29/14 assuming head CT is negative. Heparin 5/6 per neuro. Beta blockers ordered with holding parameters.  RENAL A:   No acute issues but mild low mag P:   Repelete mag with 2 more grams BMET in AM. Replace electrolytes as indicated.  GASTROINTESTINAL A:   Nutrition P:  Consult nutrition for TF as per nutrition.  HEMATOLOGIC/Oncology A:   Recently diagnosed lung CA Anemia VTE Prophylaxis  - rad onc planning radiosurgery  P:  Transfuse for Hgb < 7. SCD's only. CBC in AM. Spoke with Dr. Tammi Klippel from rad onc, not a candidate for radiation at this time. Seen rad onc notes 11/27/14  INFECTIOUS A:   Leukocytosis - no indication of infection, likely represents acute phase reactant P:   Monitor clinically. Hold abx.  ENDOCRINE A:   No acute issues P:   No interventions required.  Family updated: Spoke with wife and the entire family, informed of treatment options and lack there off.  I spoke with Dr. Tammi Klippel from radiation oncology and little to be done from that standpoint.  Cardiology also has nothing to offer and neurology feels heparin is a possibility but high risk of bleeding and I informed them of that and they wished to take the risk.  We also discussed code status however, will make patient LCB with no CPR and no cardioversion and family is to inform us of the time of withdrawal.  Will place orders.  The patient is critically ill with multiple organ systems failure and requires high complexity decision making for assessment and support, frequent evaluation and titration of therapies, application of advanced monitoring technologies and extensive interpretation of multiple databases.   Critical Care Time devoted to patient care services described in this note is  35  Minutes. This time reflects time of care of this  signee Dr Jennet Maduro. This critical care time does not reflect procedure time, or teaching time or supervisory time of PA/NP/Med student/Med Resident etc but could involve care discussion time.  Rush Farmer, M.D. [facility] Pulmonary/Critical Care Medicine. Pager: [phone]. After hours pager: [phone].  11/30/2014 11:06 AM

## 2014-11-30 NOTE — Progress Notes (Signed)
Subjective: Events of earlier this morning noted and discussed with nursing staff and patient's family (wife and daughter) at bedside. Patient apparently became more agitated and confused, as well as developing left-sided weakness. Patient eventually intubated, and is now sedated with fentanyl. Management by CCM and anesthesia. Patient underwent limited MRI the brain yesterday which demonstrated new right occipital CVA, in addition to the existing left posterior insular and left parietal CVAs demonstrate earlier this week. Nursing staff reports the patient has had a NSTEMI. 3T SRS protocol MRI the brain without and with gadolinium could not be completed yesterday.  Patient currently intubated, on a ventilator, on a fentanyl drip.  Objective: Vital signs in last 24 hours: Filed Vitals:   11/30/14 0625 11/30/14 0700 11/30/14 0725 11/30/14 0749  BP: 1'33/97 88/64 91/68 '$   Pulse: 131 127 129   Temp:    99.8 F (37.7 C)  TempSrc:    Axillary  Resp:  20 19   Height: '5\' 9"'$  (1.753 m)     Weight:      SpO2: 100% 99% 99%     Intake/Output from previous day: 05/05 0701 - 05/06 0700 In: 177.7 [P.O.:120; I.V.:7.7; IV Piggyback:50] Out: 700 [Urine:700] Intake/Output this shift:    Physical Exam:  Not opening eyes to voice or pain. Left pupil 2.5 mm, round, reactive to light. Right pupil 1.5 mm, round, sluggishly reactive to light. No movement of left upper extremity to deep central pain. Withdrawal movement of right upper extremity to deep central pain.  CBC  Recent Labs  11/28/14 0235 11/30/14 0213  WBC 18.5* 22.4*  HGB 8.0* 7.6*  HCT 23.6* 22.5*  PLT 376 270   BMET  Recent Labs  11/28/14 0235 11/30/14 0213  NA 133* 133*  K 4.2 3.5  CL 100* 99*  CO2 22 21*  GLUCOSE 143* 128*  BUN 15 14  CREATININE 0.82 0.84  CALCIUM 9.4 9.1   ABG    Component Value Date/Time   PHART 7.417 11/30/2014 0730   PCO2ART 31.2* 11/30/2014 0730   PO2ART 357* 11/30/2014 0730   HCO3 19.7*  11/30/2014 0730   TCO2 20.7 11/30/2014 0730   ACIDBASEDEF 4.0* 11/30/2014 0730   O2SAT 100.0 11/30/2014 0730    Studies/Results: Ct Head Wo Contrast  11/30/2014   CLINICAL DATA:  Mental status changes. Worsening left facial droop and left-sided weakness  EXAM: CT HEAD WITHOUT CONTRAST  TECHNIQUE: Contiguous axial images were obtained from the base of the skull through the vertex without intravenous contrast.  COMPARISON:  MRI 11/29/2014.  CT 11/29/2014.  FINDINGS: There is no evidence of increasing subarachnoid hemorrhage. A small amount of subarachnoid hemorrhage remains visible on the sulci at the right frontoparietal vertex, but is becoming less dense over time.  There is increasing low-density related to acute infarctions demonstrated yesterday by MRI. This is most notable in the right occipital lobe and in the left frontoparietal region, where the most sense of regions of infarctions were demonstrated. The occipital changes could represent actual progression on the right. Multiple small cortical and subcortical infarctions throughout both hemispheres shown by MRI are not specifically resolved by CT. Small metastatic lesions shown by MRI are not specifically result by CT.  The calvarium is unremarkable. There are mild mucosal inflammatory changes of the sinuses.  IMPRESSION: No new hemorrhage. Subarachnoid hemorrhage in the sulci of the right frontoparietal region becoming less dense over time.  Evolutionary changes of infarctions in both hemispheres. Developing low-density becoming more noticeable particularly in the right  occipital lobe and in the left frontoparietal region. The occipital changes on the right could represent actual progression.  Numerous other small infarctions shown by MRI are not specifically resolved.  Small brain metastases shown by MRI are not specifically resolved.   Electronically Signed   By: Nelson Chimes M.D.   On: 11/30/2014 07:31   Ct Head Wo Contrast  11/29/2014   CLINICAL  DATA:  Followup subarachnoid hemorrhage, history of metastatic lung cancer.  EXAM: CT HEAD WITHOUT CONTRAST  TECHNIQUE: Contiguous axial images were obtained from the base of the skull through the vertex without intravenous contrast.  COMPARISON:  MRI of the brain Nov 27, 2014 and CT of the head Nov 26, 2014  FINDINGS: Faint residual subarachnoid blood and RIGHT central sulcus. No intraparenchymal hemorrhage, mass effect or midline shift. New focal LEFT frontoparietal wedge-like hypodensities. Ventricles and sulci are normal for patient's age. New  No abnormal extra-axial fluid collections. Moderate calcific atherosclerosis of the carotid siphons. Ocular globes and orbital contents are unremarkable. Moderate ethmoid mucosal thickening without paranasal sinus air-fluid levels. The mastoid air cells are well aerated. Soft tissue within the RIGHT external auditory canal most consistent with cerumen. No skull fracture.  IMPRESSION: New patchy hypodensities LEFT cerebrum concerning for acute middle cerebral artery territory infarcts, further propagation from prior MRI.  Small amount of residual degenerating RIGHT frontal subarachnoid hemorrhage.  Acute findings discussed with and reconfirmed by Ander Purpura, RN Neuro ICU on 11/29/2014 at 6:25 am.   Electronically Signed   By: Elon Alas   On: 11/29/2014 06:25   Mr Brain Wo Contrast  11/29/2014   CLINICAL DATA:  Cerebral metastatic disease. Non-small-cell carcinoma lung  EXAM: MRI HEAD WITHOUT CONTRAST  TECHNIQUE: Multiplanar, multiecho pulse sequences of the brain and surrounding structures were obtained without intravenous contrast.  COMPARISON:  MRI head with contrast 11/27/2014  FINDINGS: Multiple areas of acute/ subacute infarction are present, with progression since the MRI of 11/27/2014. Left posterior insular infarct has extended into the left parietal lobe since the prior MRI.  Progression of acute small infarcts in the right frontal parietal lobe possibly due to  watershed infarcts.  Small area of acute infarct in the right occipital lobe has developed since the prior study. Small acute cerebellar infarcts are new measuring several mm.  Ventricle size is normal.  No shift of the midline structures.  The patient was not able to complete the study and intravenous contrast was not administered. We contacted the nurse who told the MRI technologist that Dr. Rita Ohara did not wish to attempt further imaging with sedation and intravenous contrast today. Small enhancing metastatic deposits identified on the prior MRI are not visualized without intravenous contrast.  Mild subarachnoid hemorrhage on the right again noted. No subdural fluid collection or midline shift.  IMPRESSION: Image quality degraded by motion. In addition, the patient was not able to tolerate postcontrast imaging due to pain.  Small enhancing metastatic deposits seen on the recent MRI of 11/27/2014 not visualized on today's study  Mild subarachnoid hemorrhage on the right unchanged  Progression of bilateral acute/subacute infarct since the recent MRI. See above description.  These results were called by telephone at the time of interpretation on 11/29/2014 at 1:34 pm to Dr. Leonie Man, who verbally acknowledged these results.   Electronically Signed   By: Franchot Gallo M.D.   On: 11/29/2014 13:34   Mr Shoulder Left Wo Contrast  11/29/2014   CLINICAL DATA:  Metastatic lung cancer with left shoulder pain.  EXAM: MRI OF THE LEFT SHOULDER WITHOUT CONTRAST  TECHNIQUE: Multiplanar, multisequence MR imaging of the shoulder was performed. No intravenous contrast was administered.  COMPARISON:  Left shoulder radiographs 11/27/2014  FINDINGS: Rotator cuff: Significant supraspinatus tendinopathy/ tendinosis with probable intrasubstance tears. There is also a shallow bursal surface tear. No full thickness retracted tear. The infraspinatus subscapularis tendons are intact.  Muscles:  Normal.  Biceps long head:  Intact.   Acromioclavicular Joint: Mild degenerative changes. The acromion is type 1-2 in shape. No significant lateral downsloping or undersurface spurring  Glenohumeral Joint: Mild degenerative changes. No joint effusion. Significant synovitis or adhesive capsulitis.  Labrum:  Grossly intact.  Bones: No acute bony findings. No evidence of osseous metastatic disease.  Other:  Mild subacromial/subdeltoid bursitis.  IMPRESSION: 1. No MR findings to suggest osseous metastatic disease involving the shoulder. 2. Significant supraspinatus tendinopathy/tendinosis with interstitial tears and a shallow bursal surface tear. 3. Intact biceps tendon and grossly normal glenoid labrum. 4. Significant glenohumeral joint synovitis or adhesive capsulitis. 5. Mild subacromial/subdeltoid bursitis.   Electronically Signed   By: Marijo Sanes M.D.   On: 11/29/2014 12:38   Portable Chest Xray  11/30/2014   CLINICAL DATA:  Respiratory failure  EXAM: PORTABLE CHEST - 1 VIEW  COMPARISON:  11/27/2014  FINDINGS: The endotracheal tube is 3.4 cm above the carina. There is tubing superimposed on the left upper hemi thorax, unclear identity. There continues to be prominent right peritracheal contours and hilar contours consistent with adenopathy. No pneumothorax. No acute infiltrate or CHF. No large effusion.  IMPRESSION: Satisfactory ET tube position. No other significant interval change.   Electronically Signed   By: Andreas Newport M.D.   On: 11/30/2014 06:42    Assessment/Plan: Patient with multiple serious and potentially life-threatening medical conditions including new NSTEMI, multiple acute CVAs, recent pulmonary embolism (February 2016), metastatic lung cancer with significant mediastinal adenopathy and brain metastases, and minimal nonaneurysmal subarachnoid hemorrhage (probably contributed to by therapy with aspirin, Plavix, and Lovenox). Patient has continued to have increasing difficulties and problems since admission to the critical  care medicine service earlier this week.  No specific recommendations at this time from a neurosurgical perspective. Management at this point primarily under the realm of CCM and stroke neurology.  Spoke with patient's wife and his daughter at length at the bedside, for over 20 minutes, answering their questions as best I could for them. Emphasized to them though the difficulty in managing all of the multiple serious life-threatening medical conditions.  Hosie Spangle, MD 11/30/2014, 8:11 AM

## 2014-11-30 NOTE — Progress Notes (Addendum)
Nutrition Follow-up  DOCUMENTATION CODES:  Non-severe (moderate) malnutrition in context of acute illness/injury  INTERVENTION:  Tube feeding  Initiate Vital AF 1.2 @ 25 ml/hr via OG tube and increase by 10 ml every 4 hours to goal rate of 65 ml/hr.   Tube feeding regimen provides 1872 kcal (101% of needs), 117 grams of protein, and 1265 ml of H2O.   NUTRITION DIAGNOSIS:  Malnutrition related to acute illness, cancer and cancer related treatments as evidenced by percent weight loss, mild depletion of body fat, moderate depletions of muscle mass (4% weight loss x 2 weeks).  Ongoing.   GOAL:  Patient will meet greater than or equal to 90% of their needs  Not met.   MONITOR:  Vent status, Other (Comment) (TF initiation) TF tolerance and adequacy  REASON FOR ASSESSMENT:  Malnutrition Screening Tool    ASSESSMENT:  Pt recently diagnosed with stage IV lung cancer, he was supposed to start chemo this week but was admitted with aphasia/SAH which was likely due to antiplatelets and anticoagulation. Oncology following, MRI reveals 3 mets.  Overnight pt became more agitated and confused and developed left-sided weakness, per MRI new CVAs. Wife at bedside.  Pt discussed during ICU rounds and with RN.  Spoke with MD will start feedings today.  Patient is currently intubated on ventilator support MV: 10 L/min Temp (24hrs), Avg:98.8 F (37.1 C), Min:98 F (36.7 C), Max:99.8 F (37.7 C)  Height:  Ht Readings from Last 1 Encounters:  11/30/14 $RemoveB'5\' 9"'ieqVtaIE$  (1.753 m)    Weight:  Wt Readings from Last 1 Encounters:  11/30/14 149 lb 4 oz (67.7 kg)    Ideal Body Weight:  72.7 kg  Wt Readings from Last 10 Encounters:  11/30/14 149 lb 4 oz (67.7 kg)  11/15/14 158 lb 12.8 oz (72.031 kg)  10/16/14 164 lb (74.39 kg)  10/15/14 164 lb 12.8 oz (74.753 kg)  09/24/14 157 lb 3.2 oz (71.305 kg)  06/28/14 159 lb 1.9 oz (72.176 kg)  06/21/14 162 lb 14.7 oz (73.9 kg)  11/21/13 150 lb 12  oz (68.38 kg)  10/24/12 144 lb (65.318 kg)  09/10/11 148 lb (67.132 kg)    BMI:  Body mass index is 22.03 kg/(m^2).  Estimated Nutritional Needs:  Kcal:  1610  Protein:  110-130 grams  Fluid:  > 2 L/day  Skin:  Reviewed, no issues  Diet Order:  Diet NPO time specified  EDUCATION NEEDS:  No education needs identified at this time   Intake/Output Summary (Last 24 hours) at 11/30/14 1045 Last data filed at 11/30/14 0900  Gross per 24 hour  Intake 202.67 ml  Output    450 ml  Net -247.33 ml    Last BM:  [place], [place], [place] Pager [phone] After Hours Pager

## 2014-11-30 NOTE — Progress Notes (Signed)
TCD completed.

## 2014-11-30 NOTE — Telephone Encounter (Signed)
Called to check on patient and his wife.  I left a vm message to call if needed.

## 2014-11-30 NOTE — Progress Notes (Signed)
ANTICOAGULATION CONSULT NOTE Pharmacy Consult for Heparin Indication: secondary stroke prevention  No Known Allergies  Patient Measurements: Height: '5\' 9"'$  (175.3 cm) Weight: 149 lb 4 oz (67.7 kg) IBW/kg (Calculated) : 70.7 Heparin Dosing Weight: 67.7 kg  Vital Signs: Temp: 99.3 F (37.4 C) (05/06 1201) Temp Source: Oral (05/06 1201) BP: 103/65 mmHg (05/06 2000) Pulse Rate: 121 (05/06 2000)  Labs:  Recent Labs  11/28/14 0235  11/30/14 0213 11/30/14 0742 11/30/14 0919 11/30/14 1350 11/30/14 2016  HGB 8.0*  --  7.6*  --  7.5*  --   --   HCT 23.6*  --  22.5*  --  21.5*  --   --   PLT 376  --  270  --   --   --   --   HEPARINUNFRC  --   --   --   --   --   --  0.17*  CREATININE 0.82  --  0.84  --   --   --   --   TROPONINI  --   < >  --  19.18* 19.02* 33.68*  --   < > = values in this interval not displayed.  Estimated Creatinine Clearance: 98.5 mL/min (by C-G formula based on Cr of 0.84).   Medical History: Past Medical History  Diagnosis Date  . HLD (hyperlipidemia)   . HTN (hypertension)     "took me off my BP pills" (06/20/2014)  . GERD (gastroesophageal reflux disease)     hx (06/20/2014)  . History of gout   . Myocardial infarction 02/2005; 05/2011  . Pulmonary embolism 09/21/2014    on Lovenox  . Lung mass 08/2014  . Coronary artery disease     a. s/p Taxus DES to CFX 8/06 in setting of NSTEMI;  b.  s/p Inf STEMI 06/21/11:  mRCA occluded>> PCI:  Overlapping Promus DES x 2 to RCA.;  c.  LHC (11/15):  pLAD 20-30%, oDx branches 50-60%, mCFX stent 95% ISR, dCFX stent ok, mRCA stent occluded with L-R collats, EF 50-55%, basal inf AK, mid Inf HK >> PCI:  3.25 x 23 mm Xience DES to mCFX    Medications:  Prescriptions prior to admission  Medication Sig Dispense Refill Last Dose  . aspirin 81 MG tablet Take 81 mg by mouth daily.   12/10/2014 at Unknown time  . atorvastatin (LIPITOR) 80 MG tablet TAKE ONE TABLET BY MOUTH ONCE DAILY 30 tablet 5 11/25/2014 at Unknown time   . clopidogrel (PLAVIX) 75 MG tablet TAKE ONE TABLET BY MOUTH ONCE DAILY 30 tablet 5 12/12/2014 at 0845  . diphenhydramine-acetaminophen (TYLENOL PM) 25-500 MG TABS Take 1 tablet by mouth at bedtime.   11/25/2014 at Unknown time  . enoxaparin (LOVENOX) 100 MG/ML injection Inject 1 mL (100 mg total) into the skin daily. 30 Syringe 2 11/25/2014 at 1930  . levofloxacin (LEVAQUIN) 500 MG tablet Take 500 mg by mouth daily.    11/25/2014 at Unknown time  . LORazepam (ATIVAN) 1 MG tablet Take 1 mg by mouth every 6 (six) hours as needed for anxiety (anxiety).    11/25/2014 at Unknown time  . metoprolol tartrate (LOPRESSOR) 25 MG tablet TAKE ONE-HALF TABLET BY MOUTH TWICE DAILY 30 tablet 5 12/07/2014 at 0845  . prochlorperazine (COMPAZINE) 10 MG tablet Take 1 tablet (10 mg total) by mouth every 6 (six) hours as needed for nausea or vomiting. 30 tablet 0 Past Week at Unknown time  . nitroGLYCERIN (NITROSTAT) 0.4 MG SL tablet Place 1  tablet (0.4 mg total) under the tongue every 5 (five) minutes as needed for chest pain. 25 tablet 11 11/23/2014 at unknown time    Assessment: 54 yo male admitted with acute SAH and new large CVA on R with L-sided hemiparesis.  Pt has a history of PE for which he was on full dose Lovenox (and plavix) PTA. Hgb 7.5, plts wnl.   PM HL = 0.17   Goal of Therapy:  Heparin level 0.3-0.5 units/ml Monitor platelets by anticoagulation protocol: Yes   Plan:  Heparin to 1000 units / hr Daily HL, CBC Monitor for s/sx bleeding  Thank you. Anette Guarneri, PharmD [phone]  11/30/2014 9:05 PM

## 2014-11-30 NOTE — Progress Notes (Signed)
ANTICOAGULATION CONSULT NOTE - Initial Consult  Pharmacy Consult for heparin Indication: secondary stroke prevention  No Known Allergies  Patient Measurements: Height: '5\' 9"'$  (175.3 cm) Weight: 149 lb 4 oz (67.7 kg) IBW/kg (Calculated) : 70.7 Heparin Dosing Weight: 67.7 kg  Vital Signs: Temp: 99.3 F (37.4 C) (05/06 1201) Temp Source: Oral (05/06 1201) BP: 110/71 mmHg (05/06 1200) Pulse Rate: 113 (05/06 1200)  Labs:  Recent Labs  11/27/14 1455 11/28/14 0235 11/29/14 0951 11/30/14 0213 11/30/14 0742 11/30/14 0919  HGB 7.8* 8.0*  --  7.6*  --  7.5*  HCT 23.5* 23.6*  --  22.5*  --  21.5*  PLT 382 376  --  270  --   --   CREATININE  --  0.82  --  0.84  --   --   TROPONINI  --   --  14.88*  --  19.18* 19.02*    Estimated Creatinine Clearance: 98.5 mL/min (by C-G formula based on Cr of 0.84).   Medical History: Past Medical History  Diagnosis Date  . HLD (hyperlipidemia)   . HTN (hypertension)     "took me off my BP pills" (06/20/2014)  . GERD (gastroesophageal reflux disease)     hx (06/20/2014)  . History of gout   . Myocardial infarction 02/2005; 05/2011  . Pulmonary embolism 09/21/2014    on Lovenox  . Lung mass 08/2014  . Coronary artery disease     a. s/p Taxus DES to CFX 8/06 in setting of NSTEMI;  b.  s/p Inf STEMI 06/21/11:  mRCA occluded>> PCI:  Overlapping Promus DES x 2 to RCA.;  c.  LHC (11/15):  pLAD 20-30%, oDx branches 50-60%, mCFX stent 95% ISR, dCFX stent ok, mRCA stent occluded with L-R collats, EF 50-55%, basal inf AK, mid Inf HK >> PCI:  3.25 x 23 mm Xience DES to mCFX    Medications:  Prescriptions prior to admission  Medication Sig Dispense Refill Last Dose  . aspirin 81 MG tablet Take 81 mg by mouth daily.   12/06/2014 at Unknown time  . atorvastatin (LIPITOR) 80 MG tablet TAKE ONE TABLET BY MOUTH ONCE DAILY 30 tablet 5 11/25/2014 at Unknown time  . clopidogrel (PLAVIX) 75 MG tablet TAKE ONE TABLET BY MOUTH ONCE DAILY 30 tablet 5 12/07/2014 at  0845  . diphenhydramine-acetaminophen (TYLENOL PM) 25-500 MG TABS Take 1 tablet by mouth at bedtime.   11/25/2014 at Unknown time  . enoxaparin (LOVENOX) 100 MG/ML injection Inject 1 mL (100 mg total) into the skin daily. 30 Syringe 2 11/25/2014 at 1930  . levofloxacin (LEVAQUIN) 500 MG tablet Take 500 mg by mouth daily.    11/25/2014 at Unknown time  . LORazepam (ATIVAN) 1 MG tablet Take 1 mg by mouth every 6 (six) hours as needed for anxiety (anxiety).    11/25/2014 at Unknown time  . metoprolol tartrate (LOPRESSOR) 25 MG tablet TAKE ONE-HALF TABLET BY MOUTH TWICE DAILY 30 tablet 5 12/07/2014 at 0845  . prochlorperazine (COMPAZINE) 10 MG tablet Take 1 tablet (10 mg total) by mouth every 6 (six) hours as needed for nausea or vomiting. 30 tablet 0 Past Week at Unknown time  . nitroGLYCERIN (NITROSTAT) 0.4 MG SL tablet Place 1 tablet (0.4 mg total) under the tongue every 5 (five) minutes as needed for chest pain. 25 tablet 11 11/23/2014 at unknown time    Assessment: 54 yo male admitted with acute SAH and new large CVA on R with L-sided hemiparesis.  Pt has a  history of PE for which he was on full dose Lovenox (and plavix) PTA. Hgb 7.5, plts wnl.    Goal of Therapy:  Heparin level 0.3-0.5 units/ml Monitor platelets by anticoagulation protocol: Yes   Plan:  Heparin 850 units/hr Daily HL, CBC First level this eveing Monitor for s/sx bleeding    Hughes Better, PharmD, BCPS Clinical Pharmacist Pager: [phone] 11/30/2014 1:33 PM

## 2014-11-30 NOTE — Progress Notes (Signed)
STROKE TEAM PROGRESS NOTE   HISTORY [name] is an 54 y.o. male with a past medical history significant for HTN, hyperlipidemia, CAD s/p stenting, MI, smoking, newly diagnosed left lung cancer on 2/16, right PE, transferred to [facility] for further evaluation and management of right sulcal SAH. Patient indicated that he was at the [place] center yesterday, about to initiate his chemotherapy, but after going to the bathroom he developed sudden onset of difficulty expressing himself that he describes as " knowing what I wanted to say but couldn't get anything out". The episode lasted for at least 10 minutes and he recalls that he also had some numbness-tingling around the left side of his mouth that then travelled to the left hand. No associated HA, vertigo, double vision, confusion, focal weakness, or vision impairment. Of note, he tells me that the day before he had " numbness and tingling of the left lower face and left arm lasting for 10 or 15 minutes". His LKW was 11/25/2014 at 1400. Patient was sent to [facility] where he had a CT brain that I reviewed myself and demonstrated a small acute subarachnoid hemorrhage within a sulcus at the RIGHT parietal lobe. Mr. Lacount has been on a combination of aspirin and plavix since previous MI and Lovenox 100 mg was added this past February for PE treatment. Patient was not administered TPA secondary to [facility]. He was admitted to the neuro ICU for further evaluation and treatment.   SUBJECTIVE (INTERVAL HISTORY) His wife , daughter , mother and other family members are at the bedside.  Overall he feels his condition is deteriorated over last 24 hrs. he developed agitation last night with new onset of left hemiparesis and required intubation and sedation. He is currently on fentanyl drip. Repeat CT scan of the head this morning shows a new right parietal hypodensity consistent with an new stroke which was not there on yesterday's MRI scan. He has also developed the  acute MI and is hypotensive.  OBJECTIVE Temp:  [98 F (36.7 C)-99.8 F (37.7 C)] 99.3 F (37.4 C) (05/06 1201) Pulse Rate:  [98-167] 105 (05/06 1510) Cardiac Rhythm:  [-] Sinus tachycardia (05/06 1200) Resp:  [7-33] 16 (05/06 1510) BP: (88-156)/(62-126) 96/75 mmHg (05/06 1510) SpO2:  [92 %-100 %] 100 % (05/06 1510) FiO2 (%):  [40 %-100 %] 40 % (05/06 1510) Weight:  [149 lb 4 oz (67.7 kg)] 149 lb 4 oz (67.7 kg) (05/06 0500)  No results for input(s): GLUCAP in the last 168 hours.  Recent Labs Lab 12/01/2014 1236  12/19/2014 1535 11/27/14 1228 11/28/14 0235 11/30/14 0213  NA 140  --  140 135 133* 133*  K 4.2  --  3.9 3.7 4.2 3.5  CL  --   --  106 102 100* 99*  CO2 23  --  '24 22 22 '$ 21*  GLUCOSE 100  --  103* 115* 143* 128*  BUN 20.8  --  24* '17 15 14  '$ CREATININE 0.8  --  0.85 0.84 0.82 0.84  CALCIUM 9.7  < > 9.8 9.1 9.4 9.1  MG  --   --   --  1.8 1.9 2.0  PHOS  --   --   --  3.2 4.2 3.0  < > = values in this interval not displayed.  Recent Labs Lab 12/15/2014 1236 12/05/2014 1535  AST 24 31  ALT 40 44  ALKPHOS 108 111  BILITOT <0.20 0.2*  PROT 7.2 7.8  ALBUMIN 3.6 4.3  Recent Labs Lab 12/24/2014 1236 12/02/2014 1535 11/27/14 1455 11/28/14 0235 11/30/14 0213 11/30/14 0919  WBC 21.8* 19.2* 16.1* 18.5* 22.4*  --   NEUTROABS 16.1* 13.4*  --   --   --   --   HGB 8.6* 8.8* 7.8* 8.0* 7.6* 7.5*  HCT 26.2* 26.8* 23.5* 23.6* 22.5* 21.5*  MCV 92.0 95.4 92.2 91.5 92.6  --   PLT 448* 474* 382 376 270  --     Recent Labs Lab 11/27/14 1228 11/29/14 0951 11/30/14 0742 11/30/14 0919 11/30/14 1350  TROPONINI 1.07* 14.88* 19.18* 19.02* 33.68*   No results for input(s): LABPROT, INR in the last 72 hours. No results for input(s): COLORURINE, LABSPEC, [place], GLUCOSEU, HGBUR, BILIRUBINUR, KETONESUR, PROTEINUR, UROBILINOGEN, NITRITE, LEUKOCYTESUR in the last 72 hours.  Invalid input(s): APPERANCEUR     Component Value Date/Time   CHOL 172 11/28/2014 0235   TRIG 166*  11/28/2014 0235   HDL 32* 11/28/2014 0235   CHOLHDL 5.4 11/28/2014 0235   VLDL 33 11/28/2014 0235   LDLCALC 107* 11/28/2014 0235   Lab Results  Component Value Date   HGBA1C 5.2 11/28/2014   No results found for: LABOPIA, COCAINSCRNUR, LABBENZ, AMPHETMU, THCU, LABBARB   Recent Labs Lab 11/30/2014 [place] <5    Dg Chest 2 View 11/27/2014    1. Persistent mediastinal and left hilar adenopathy. 2. Persistent ill-defined density left upper lobe . No new lesions identified. 12/18/2014    1. Unchanged LEFT upper lobe spiculated pulmonary nodule. 2. RIGHT peritracheal and LEFT hilar adenopathy.     Ct Head Wo Contrast 12/19/2014   Acute subarachnoid hemorrhage within a sulcus at the RIGHT parietal lobe.  No other intracranial abnormalities.    [name] Contrast 11/27/2014    1. 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 No significant mass effect or associated hemorrhage. 2. Additional subcentimeter focus of restricted diffusion adjacent to the atrium of the right lateral ventricle, also suspicious for small acute ischemic infarct. 3. Stable volume and distribution of acute subarachnoid hemorrhage within the right frontal region as compared to prior CT. No new intracranial hemorrhage. 4. 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 One of these lesions lies in close proximity to the acute subarachnoid hemorrhage within the right frontal lobe, raising the suspicion that this is the source of bleeding.   [name] 11/27/2014     5. Normal MRV of the brain.   PHYSICAL EXAM  Intubated. Afebrile. Head is nontraumatic. Neck is supple without bruit.    Cardiac exam no murmur or gallop. Lungs are clear to auscultation. Distal pulses are well felt.  Neurological Exam ;   Intubated. Sedated on fentanyl drip. Eyes  are closed and open only partially to sternal rub. Pupils 3 mm sluggishly reactive. Corneal reflexes are present. Fundi could not be visualized. Grimaces to pain bilaterally. Tongue midline. Has cough and gag. Spontaneous antigravity purposeful movements on the right side. Dense left hemiplegia with only mild withdrawal in the left lower extremity to pain. Left plantar upgoing right downgoing. ASSESSMENT/PLAN Mr. ATANACIO MELNYK is a 54 y.o. male with history of HTN, hyperlipidemia, CAD s/p stenting, MI, smoking, newly diagnosed left lung cancer on 2/16, right PE who developed sudden onset of expressive aphasia. CT showed small R parietal SAH. He did not receive IV t-PA due to [facility].  Subsequent repeat CT scans as well as MRI scans have shown progressive bilateral strokes and also brain metastasis  STROKE Right SAH  Secondary to triple antithrombotics, including full dose lovenox for PE as well as asprin and plavix for recent stent Ischemic left frontotemporal and parietal infarcts embolic secondary to hypercoagulable state from lung cancer  Resultant  Neuro deficits resolved initially but progressive neurological worsening with agitation and dense left hemiplegia since 11/29/14  MRI  left frontotemporal and parietal ischemic infarcts. Small R PV ischemic infarct. R frontal SAH.  Multifocal small brain mets.  Check carotid Doppler  (EPIC order not currently available)  Check TCD    2D Echo  09/22/2014 No source of embolus   EEG  normal  Check lipid panel, LDL 102 mg%  Check HgbA1c, 5.2  SCDs for VTE prophylaxis Diet NPO time specified  aspirin 81 mg orally every day, clopidogrel 75 mg orally every day and full dose lovenox prior to admission, for now  no antithrombotics given hemorrhage. If CT improved after 2 days, will plan resumption of low dose aspirin. Long-term medication is complex given recent stents, cancer and ischemic stroke   Continue ICU level care x 24h  Therapy  recommendations:  None anticipated initially but worsened exam 11/29/14. 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  Undergoing chemo treatment  Neurosurgery [facility]) consulted  Brain mets felt to be amenable to stereotactic radiosurgery  Pulmonary Embolism  Hypercoagulable from cancer  On full dose lovenox prior to admission  Given 50 protamine  IVC filter planned today  Hypertension  Home meds:   lopressor  Stable  Hyperlipidemia  Home meds:  lipitor 80, resumed in hospital  LDL 107, goal < 70  Continue statin at discharge  Other Stroke Risk Factors  Cigarette smoker, 39 year history, advised to stop smoking  Marijuana use  Coronary artery disease - MI 2006, 2012; stent 2006, 2012, 2015; PCI. [facility])  Other Active Problems  Leukocytosis, no indication of infection  Elevated troponins   Hospital day # [place] for Pager information 11/30/2014 3:48 PM  I have personally examined this patient, reviewed notes, independently viewed imaging studies, participated in medical decision making and plan of care. I have made any additions or clarifications directly to the above note. Agree with note above. He presented with TIA but MRI scan  Initially showed silent tiny right frontal convexity SAH likely related to being on dual antiplatelets and Lovenox as well as multiple small bilateral embolic strokes of unknown etiology.as well as brain metastasis. Hypercoagulability from cancer or it`s treatment could certainly be contributory. This is a extremely complex situation requiring medical decision making of high complexity has we had to hold antiplatelet and Lovenox due to acute SAH but doing so increases risk of coronary stent thrombosis and pulmonary embolism but we do not have any choice. Patient had neurological worsening on 11/29/14 due to new stroke hence started aspirin 325 mg daily given  interval new left frontal infarct   And plan was to start after 2 weeks  anticoagulation with Eliquis given h/o PE but do to further neurological worsening on 11/30/14 with interval new stroke after long discussion with wife, mother, daughter and discussing carefully risk-benefit of IV heparin including specifically the possibility of increasing brain hemorrhage leading to death family wants me to be aggressive and start IV heparin stroke protocol..This is a very frustrating and challenging situation as patient  clinically presented with TIA symptoms and had silent infarcts but over the last 3-5 days has had progressively new interval strokes and neurological worsening while we have held antiplatelet therapy and anticoagulation due to recent small subarachnoid hemorrhage. Long d/w patient and family members and explained complex difficult and  tricky situation and they agree with plan. the patient's neurological as well as overall prognosis is now quite grim and brain radiation and chemotherapy plans may have to be on hold and family remain fact need to consider palliative care. discuss with Dr. Nelda Marseille critical care medicine This patient is critically ill and at significant risk of neurological worsening, death and care requires constant monitoring of vital signs, hemodynamics,respiratory and cardiac monitoring,review of multiple databases, neurological assessment, discussion with family, other specialists and medical decision making of high complexity.I have made any additions or clarifications directly to the above note.  I spent 70 minutes of neurocritical care time  in the care of  this patient.   Antony Contras, MD Medical Director [facility] Pager: [phone] 11/30/2014 3:48 PM   To contact Stroke Continuity provider, please refer to [email or web address] After hours, contact General Neurology

## 2014-11-30 NOTE — Anesthesia Procedure Notes (Signed)
Procedure Name: Intubation Date/Time: 11/30/2014 6:15 AM Performed by: Duane Boston Pre-anesthesia Checklist: Patient identified, Emergency Drugs available, Suction available, Patient being monitored and Timeout performed Patient Re-evaluated:Patient Re-evaluated prior to inductionIntubation Type: IV induction, Rapid sequence and Cricoid Pressure applied Laryngoscope Size: Mac and 3 Grade View: Grade II Tube type: Oral Number of attempts: 1 Placement Confirmation: ETT inserted through vocal cords under direct vision,  CO2 detector and breath sounds checked- equal and bilateral Secured at: 23 cm Tube secured with: Tape

## 2014-11-30 NOTE — Progress Notes (Addendum)
   11/30/14 1400  Clinical Encounter Type  Visited With Patient and family together;Health care provider  Visit Type Initial;Spiritual support;Social support;Critical Care  Referral From Nurse;Physician  Spiritual Encounters  Spiritual Needs Emotional  Stress Factors  Family Stress Factors Health changes;Major life changes   Chaplain was paged to patient's room at 10:46 AM and notified that the patient's family was having a hard time and likely did not get good news from the doctor. When chaplain arrived, patient's wife was walking out of the room crying. She was having a difficult time talking and explaining what was going on. Patient's wife explained that they had heard from the neurologist earlier and got some bad news. Patient's wife kept saying, "he (being the patient) isn't going to come home from this". Over 71 or 20 other family members were present both in the room and out in the waiting area. Patient's wife continued to have a difficult time while family and friends attempted to calm her down and support her. Chaplain was present while patient's family consulted with Dr. Nelda Marseille. Patient's family seems to understand that the patient is very sick and there is not much more curative treatment that can be done. However, the family is really hesitant about letting go completely at this point. Patient's family did decide to make patient a partial code. Chaplain let unit's secretary know that if any of the staff or family feels more support is needed throughout the day to page the on-call chaplain pager. Chaplain will also recommend a follow up from the unit's chaplain this weekend.  Keyontae Huckeby, Claudius Sis, Chaplain  3:18 PM

## 2014-11-30 NOTE — Significant Event (Signed)
PCCM Interval Progress Note  Called by Warren Lacy MD and asked to assess pt at bedside for severe agitation.  Pt was fine prior to midnight just before he went to bed.  He abruptly awoke from sleep at about 5:40 AM with severe agitation and thrashing in his bed. LLE was noted to be flacid.  He was mumbling random sounds as he thrashed in bed.  '2mg'$  IV ativan given without response.  Pt then proceeded to pull out his IV.  '5mg'$  IM haldol then given with no response again.  PIV placed by RN and '4mg'$  IV ativan given with somewhat of a response.  Pt's agitation decreased and he began to calm down; however, still continued to wave his hands (R > L) and move his head.  High suspicion for extension of known SAH or new intracranial hemorrhage given his sudden change in clinical status.  Neurology at bedside (Dr. Aram Beecham) and agrees with concern for extension or new head bleed.  Anesthesia called by Dr. Lamonte Sakai in [place] who responded shortly thereafter.  Pt will be intubated and have stat head CT performed for further evaluation.  Just prior to intubation, pt had a 9 beat run of V.tach.  EKG obtained post intubation revealed ST elevations in leads II, III, aVF with reciprocal ST depressions in V1 and V2.  Troponin one day prior from 5/5 noted to be 14.88.  Repeat troponin for this am 5/6 pending.  Pt not a candidate for heparin or aspirin given known SAH with potential extension or new bleed.    VITAL SIGNS: Temp:  [98 F (36.7 C)-98.7 F (37.1 C)] 98.7 F (37.1 C) (05/06 0344) Pulse Rate:  [85-126] 126 (05/05 1900) Resp:  [10-27] 20 (05/05 2300) BP: (106-153)/(65-110) 153/82 mmHg (05/05 2300) SpO2:  [92 %-100 %] 96 % (05/05 2000)  PHYSICAL EXAM: General:  Adult male, significantly agitated in bed. Neuro:  Sedated following intubation.  Moving right side > left.  LLE almost flaccid.  Left pupil slightly larger than right. Cardiovascular:  Tachy, regular. Lungs:  Clear.   IMPRESSION / PLAN:  High  suspicious for extension of known SAH versus new head bleed Plan: Intubate now. Sedation:  Fentanyl gtt / Versed PRN. RASS goal:  -1. STAT Head CT. Will likely need to have family discussion once head CT completed.  Acute inferior STEMI - troponin from 5/5 noted at 14.88 Sinus Tach Plan: ASA / Heparin contraindicated given known head bleed with possibility of extension and/or new bleed. '5mg'$  Lopressor now. Defer cards consult for now until further discussions with family (options limited given SAH).   Day team to follow up on head CT results and decision on whether to formally consult cardiology or not.  Will also need to have discussion with pt's wife / family.   Montey Hora, [facility] Pgr: [phone]  or [phone] 11/30/2014, 6:22 AM

## 2014-12-01 ENCOUNTER — Inpatient Hospital Stay (HOSPITAL_COMMUNITY): Payer: 59

## 2014-12-01 DIAGNOSIS — J9600 Acute respiratory failure, unspecified whether with hypoxia or hypercapnia: Secondary | ICD-10-CM

## 2014-12-01 DIAGNOSIS — I6340 Cerebral infarction due to embolism of unspecified cerebral artery: Secondary | ICD-10-CM | POA: Insufficient documentation

## 2014-12-01 DIAGNOSIS — I214 Non-ST elevation (NSTEMI) myocardial infarction: Secondary | ICD-10-CM | POA: Insufficient documentation

## 2014-12-01 DIAGNOSIS — J9690 Respiratory failure, unspecified, unspecified whether with hypoxia or hypercapnia: Secondary | ICD-10-CM | POA: Insufficient documentation

## 2014-12-01 LAB — BLOOD GAS, ARTERIAL
Acid-base deficit: 1.8 mmol/L (ref 0.0–2.0)
BICARBONATE: 21.4 meq/L (ref 20.0–24.0)
Drawn by: 29017
FIO2: 0.4 %
MECHVT: 570 mL
O2 SAT: 99.9 %
PATIENT TEMPERATURE: 98.6
PEEP: 5 cmH2O
RATE: 14 resp/min
TCO2: 22.4 mmol/L (ref 0–100)
pCO2 arterial: 30.5 mmHg — ABNORMAL LOW (ref 35.0–45.0)
pH, Arterial: 7.461 — ABNORMAL HIGH (ref 7.350–7.450)
pO2, Arterial: 153 mmHg — ABNORMAL HIGH (ref 80.0–100.0)

## 2014-12-01 LAB — BASIC METABOLIC PANEL
Anion gap: 11 (ref 5–15)
BUN: 33 mg/dL — AB (ref 6–20)
CALCIUM: 8.9 mg/dL (ref 8.9–10.3)
CO2: 22 mmol/L (ref 22–32)
Chloride: 99 mmol/L — ABNORMAL LOW (ref 101–111)
Creatinine, Ser: 1.29 mg/dL — ABNORMAL HIGH (ref 0.61–1.24)
GFR calc Af Amer: >60 mL/min (ref >60)
GFR calc non Af Amer: >60 mL/min (ref >60)
GLUCOSE: 150 mg/dL — AB (ref 70–99)
Potassium: 4 mmol/L (ref 3.5–5.1)
Sodium: 132 mmol/L — ABNORMAL LOW (ref 135–145)

## 2014-12-01 LAB — CBC
HCT: 21.6 % — ABNORMAL LOW (ref 39.0–52.0)
HEMOGLOBIN: 7.1 g/dL — AB (ref 13.0–17.0)
MCH: 30.6 pg (ref 26.0–34.0)
MCHC: 32.9 g/dL (ref 30.0–36.0)
MCV: 93.1 fL (ref 78.0–100.0)
Platelets: 236 10*3/uL (ref 150–400)
RBC: 2.32 MIL/uL — ABNORMAL LOW (ref 4.22–5.81)
RDW: 14.5 % (ref 11.5–15.5)
WBC: 23.4 10*3/uL — ABNORMAL HIGH (ref 4.0–10.5)

## 2014-12-01 LAB — HEPARIN LEVEL (UNFRACTIONATED)
Heparin Unfractionated: 0.19 IU/mL — ABNORMAL LOW (ref 0.30–0.70)
Heparin Unfractionated: 0.38 IU/mL (ref 0.30–0.70)

## 2014-12-01 LAB — MAGNESIUM: Magnesium: 2.4 mg/dL (ref 1.7–2.4)

## 2014-12-01 LAB — PHOSPHORUS: PHOSPHORUS: 4.6 mg/dL (ref 2.5–4.6)

## 2014-12-01 LAB — GLUCOSE, CAPILLARY
GLUCOSE-CAPILLARY: 140 mg/dL — AB (ref 70–99)
GLUCOSE-CAPILLARY: 164 mg/dL — AB (ref 70–99)
Glucose-Capillary: 157 mg/dL — ABNORMAL HIGH (ref 70–99)
Glucose-Capillary: 143 mg/dL — ABNORMAL HIGH (ref 70–99)

## 2014-12-01 MED ORDER — SODIUM CHLORIDE 0.9 % IV BOLUS (SEPSIS)
500.0000 mL | Freq: Once | INTRAVENOUS | Status: AC
  Administered 2014-12-01: 500 mL via INTRAVENOUS

## 2014-12-01 MED ORDER — PANTOPRAZOLE SODIUM 40 MG PO PACK
40.0000 mg | PACK | Freq: Every day | ORAL | Status: DC
  Filled 2014-12-01: qty 20

## 2014-12-01 MED ORDER — SODIUM CHLORIDE 0.9 % IV SOLN
1.0000 mg/h | INTRAVENOUS | Status: DC
  Administered 2014-12-01: 1 mg/h via INTRAVENOUS
  Administered 2014-12-02: 4 mg/h via INTRAVENOUS
  Filled 2014-12-01 (×2): qty 10

## 2014-12-01 MED ORDER — SODIUM CHLORIDE 0.9 % IV SOLN
1.0000 mg/h | INTRAVENOUS | Status: DC
  Filled 2014-12-01: qty 10

## 2014-12-01 MED ORDER — SODIUM CHLORIDE 0.9 % IV SOLN: 250.0000 mL | INTRAVENOUS | Status: DC | PRN

## 2014-12-01 NOTE — Progress Notes (Signed)
Chaplain initiated follow up after referral from [employer]. Chaplain introduced herself and her services to large family in waiting area. Chaplain informed them of her continued presence should they need it. Chaplain will continue to follow.   12/01/14 1500  Clinical Encounter Type  Visited With Family  Visit Type Follow-up;Spiritual support  Referral From Chaplain  Recommendations Follow  Spiritual Encounters  Spiritual Needs Emotional;Prayer  Stress Factors  Family Stress Factors Health changes  [name], [name] 12/01/2014 3:18 PM

## 2014-12-01 NOTE — Progress Notes (Signed)
PULMONARY / CRITICAL CARE MEDICINE   Name: BRODRIC SCHAUER MRN: [id number] DOB: [birth year]    ADMISSION DATE:  11/30/2014 CONSULTATION DATE:  12/01/2014  REFERRING MD :  EDP  CHIEF COMPLAINT:  Speech difficulties  INITIAL PRESENTATION:   54 yo male with slurred speech from [facility].  He had recent dx of Stage IV NSCLC, PE, and has hx of CAD.  STUDIES:  CT head 5/2 >>> acute SAH within sulcus at the right parietal lobe.  SUBJECTIVE/OVERNIGHT/INTERVAL HX 5/2 admit with Rt SAH 5/3 IVC filter placed 5/6 restroke overnight and new MI requiring intubation overnight.  VITAL SIGNS: Temp:  [98.6 F (37 C)-99.8 F (37.7 C)] 98.6 F (37 C) (05/07 0400) Pulse Rate:  [103-129] 112 (05/07 0630) Resp:  [14-23] 19 (05/07 0630) BP: (89-112)/(57-78) 108/77 mmHg (05/07 0630) SpO2:  [98 %-100 %] 100 % (05/07 0630) FiO2 (%):  [40 %-100 %] 40 % (05/07 0400) Weight:  [156 lb 12 oz (71.1 kg)] 156 lb 12 oz (71.1 kg) (05/07 0600) VENTILATOR SETTINGS: Vent Mode:  [-] PRVC FiO2 (%):  [40 %-100 %] 40 % Set Rate:  [14 bmp] 14 bmp Vt Set:  [570 mL] 570 mL PEEP:  [5 cmH20] 5 cmH20 Plateau Pressure:  [11 cmH20-16 cmH20] 11 cmH20 INTAKE / OUTPUT: Intake/Output      05/06 0701 - 05/07 0700 05/07 0701 - 05/08 0700   P.O.     I.V. (mL/kg) 322.5 (4.5)    NG/GT 548.3    IV Piggyback     Total Intake(mL/kg) 870.8 (12.2)    Urine (mL/kg/hr) 215 (0.1)    Total Output 215     Net +655.8          Urine Occurrence 1 x     PHYSICAL EXAMINATION: General: no distress Neuro: RASS -2, moves Rt side HEENT: ETT in place Cardiovascular: regular, tachycardic Lungs: no wheeze Abdomen: soft Musculoskeletal: No edema Skin:no rashes  LABS:  PULMONARY  Recent Labs Lab 11/30/14 0730 12/01/14 0323  PHART 7.417 7.461*  PCO2ART 31.2* 30.5*  PO2ART 357* 153*  HCO3 19.7* 21.4  TCO2 20.7 22.4  O2SAT 100.0 99.9   CBC  Recent Labs Lab 11/28/14 0235 11/30/14 0213 11/30/14 0919 12/01/14 0400  HGB 8.0* 7.6*  7.5* 7.1*  HCT 23.6* 22.5* 21.5* 21.6*  WBC 18.5* 22.4*  --  23.4*  PLT 376 270  --  236   COAGULATION  Recent Labs Lab 12/19/2014 1535  INR 1.10   CARDIAC    Recent Labs Lab 11/29/14 0951 11/30/14 0742 11/30/14 0919 11/30/14 1350 11/30/14 2016  TROPONINI 14.88* 19.18* 19.02* 33.68* 38.35*   CHEMISTRY  Recent Labs Lab 11/25/2014 1535 11/27/14 1228 11/28/14 0235 11/30/14 0213 12/01/14 0400  NA 140 135 133* 133* 132*  K 3.9 3.7 4.2 3.5 4.0  CL 106 102 100* 99* 99*  CO2 '24 22 22 '$ 21* 22  GLUCOSE 103* 115* 143* 128* 150*  BUN 24* '17 15 14 '$ 33*  CREATININE 0.85 0.84 0.82 0.84 1.29*  CALCIUM 9.8 9.1 9.4 9.1 8.9  MG  --  1.8 1.9 2.0 2.4  PHOS  --  3.2 4.2 3.0 4.6   Estimated Creatinine Clearance: 67 mL/min (by C-G formula based on Cr of 1.29).  LIVER  Recent Labs Lab 12/18/2014 1236 12/18/2014 1535  AST 24 31  ALT 40 44  ALKPHOS 108 111  BILITOT <0.20 0.2*  PROT 7.2 7.8  ALBUMIN 3.6 4.3  INR  --  1.10   IMAGING x48h Ct Head  Wo Contrast  11/30/2014   CLINICAL DATA:  Mental status changes. Worsening left facial droop and left-sided weakness  EXAM: CT HEAD WITHOUT CONTRAST  TECHNIQUE: Contiguous axial images were obtained from the base of the skull through the vertex without intravenous contrast.  COMPARISON:  MRI 11/29/2014.  CT 11/29/2014.  FINDINGS: There is no evidence of increasing subarachnoid hemorrhage. A small amount of subarachnoid hemorrhage remains visible on the sulci at the right frontoparietal vertex, but is becoming less dense over time.  There is increasing low-density related to acute infarctions demonstrated yesterday by MRI. This is most notable in the right occipital lobe and in the left frontoparietal region, where the most sense of regions of infarctions were demonstrated. The occipital changes could represent actual progression on the right. Multiple small cortical and subcortical infarctions throughout both hemispheres shown by MRI are not  specifically resolved by CT. Small metastatic lesions shown by MRI are not specifically result by CT.  The calvarium is unremarkable. There are mild mucosal inflammatory changes of the sinuses.  IMPRESSION: No new hemorrhage. Subarachnoid hemorrhage in the sulci of the right frontoparietal region becoming less dense over time.  Evolutionary changes of infarctions in both hemispheres. Developing low-density becoming more noticeable particularly in the right occipital lobe and in the left frontoparietal region. The occipital changes on the right could represent actual progression.  Numerous other small infarctions shown by MRI are not specifically resolved.  Small brain metastases shown by MRI are not specifically resolved.   Electronically Signed   By: Nelson Chimes M.D.   On: 11/30/2014 07:31   Mr Brain Wo Contrast  11/29/2014   CLINICAL DATA:  Cerebral metastatic disease. Non-small-cell carcinoma lung  EXAM: MRI HEAD WITHOUT CONTRAST  TECHNIQUE: Multiplanar, multiecho pulse sequences of the brain and surrounding structures were obtained without intravenous contrast.  COMPARISON:  MRI head with contrast 11/27/2014  FINDINGS: Multiple areas of acute/ subacute infarction are present, with progression since the MRI of 11/27/2014. Left posterior insular infarct has extended into the left parietal lobe since the prior MRI.  Progression of acute small infarcts in the right frontal parietal lobe possibly due to watershed infarcts.  Small area of acute infarct in the right occipital lobe has developed since the prior study. Small acute cerebellar infarcts are new measuring several mm.  Ventricle size is normal.  No shift of the midline structures.  The patient was not able to complete the study and intravenous contrast was not administered. We contacted the nurse who told the MRI technologist that Dr. Rita Ohara did not wish to attempt further imaging with sedation and intravenous contrast today. Small enhancing metastatic  deposits identified on the prior MRI are not visualized without intravenous contrast.  Mild subarachnoid hemorrhage on the right again noted. No subdural fluid collection or midline shift.  IMPRESSION: Image quality degraded by motion. In addition, the patient was not able to tolerate postcontrast imaging due to pain.  Small enhancing metastatic deposits seen on the recent MRI of 11/27/2014 not visualized on today's study  Mild subarachnoid hemorrhage on the right unchanged  Progression of bilateral acute/subacute infarct since the recent MRI. See above description.  These results were called by telephone at the time of interpretation on 11/29/2014 at 1:34 pm to Dr. Leonie Man, who verbally acknowledged these results.   Electronically Signed   By: Franchot Gallo M.D.   On: 11/29/2014 13:34   Mr Shoulder Left Wo Contrast  11/29/2014   CLINICAL DATA:  Metastatic lung cancer with  left shoulder pain.  EXAM: MRI OF THE LEFT SHOULDER WITHOUT CONTRAST  TECHNIQUE: Multiplanar, multisequence MR imaging of the shoulder was performed. No intravenous contrast was administered.  COMPARISON:  Left shoulder radiographs 11/27/2014  FINDINGS: Rotator cuff: Significant supraspinatus tendinopathy/ tendinosis with probable intrasubstance tears. There is also a shallow bursal surface tear. No full thickness retracted tear. The infraspinatus subscapularis tendons are intact.  Muscles:  Normal.  Biceps long head:  Intact.  Acromioclavicular Joint: Mild degenerative changes. The acromion is type 1-2 in shape. No significant lateral downsloping or undersurface spurring  Glenohumeral Joint: Mild degenerative changes. No joint effusion. Significant synovitis or adhesive capsulitis.  Labrum:  Grossly intact.  Bones: No acute bony findings. No evidence of osseous metastatic disease.  Other:  Mild subacromial/subdeltoid bursitis.  IMPRESSION: 1. No MR findings to suggest osseous metastatic disease involving the shoulder. 2. Significant supraspinatus  tendinopathy/tendinosis with interstitial tears and a shallow bursal surface tear. 3. Intact biceps tendon and grossly normal glenoid labrum. 4. Significant glenohumeral joint synovitis or adhesive capsulitis. 5. Mild subacromial/subdeltoid bursitis.   Electronically Signed   By: Marijo Sanes M.D.   On: 11/29/2014 12:38   Dg Chest Port 1 View  12/01/2014   CLINICAL DATA:  Acute respiratory failure.  EXAM: PORTABLE CHEST - 1 VIEW  COMPARISON:  11/30/2014  FINDINGS: The endotracheal tube tip is 4.2 cm above the carina. The nasogastric tube extends into the stomach. The lungs are clear. There is no pneumothorax. There is no large effusion. There are unchanged mass contours in the upper mediastinum.  IMPRESSION: Support equipment appears satisfactorily positioned.  The lungs are grossly clear.   Electronically Signed   By: Andreas Newport M.D.   On: 12/01/2014 05:36   Portable Chest Xray  11/30/2014   CLINICAL DATA:  Respiratory failure  EXAM: PORTABLE CHEST - 1 VIEW  COMPARISON:  11/27/2014  FINDINGS: The endotracheal tube is 3.4 cm above the carina. There is tubing superimposed on the left upper hemi thorax, unclear identity. There continues to be prominent right peritracheal contours and hilar contours consistent with adenopathy. No pneumothorax. No acute infiltrate or CHF. No large effusion.  IMPRESSION: Satisfactory ET tube position. No other significant interval change.   Electronically Signed   By: Andreas Newport M.D.   On: 11/30/2014 06:42   Dg Abd Portable 1v  11/30/2014   CLINICAL DATA:  54 year old male with a history of feeding tube  EXAM: PORTABLE ABDOMEN - 1 VIEW  COMPARISON:  11/27/2014  FINDINGS: Gas within small bowel and colon. No abnormally distended small bowel or colon.  Gastric tube projects over the lower mediastinum, terminating in the left upper quadrant. Side port terminates near the gastroesophageal junction.  IVC filter.  No displaced fracture.  No unexpected calcifications.   IMPRESSION: Gastric tube terminates in the left upper quadrant, overlying the stomach. The side port terminates near the GE junction.  Nonobstructive bowel gas pattern.  Signed,  Dulcy Fanny. Earleen Newport, DO  [facility]  [facility]   Electronically Signed   By: Corrie Mckusick D.O.   On: 11/30/2014 12:33   ASSESSMENT / PLAN:  NEUROLOGIC A:   Acute SAH on Admit. New large Rt CVA 5/03. P:   Per neurology and neurosurgery  PULMONARY A: Acute respiratory failure 2nd to mental status. Hx of smoking. P:   Full vent support  CARDIOVASCULAR A:  Dx of PE in February. Hx HTN, HLD, MI, CAD s/p DES 2006 - on plavix and aspirin.  Elevated troponin 2nd to NSTEMI. P:  Resumed ASA Resumed heparin gtt Continue lipitor  RENAL A:   No acute issues. P:   Monitor renal fx  GASTROINTESTINAL A:   Nutrition. P:   Tube feeds  HEMATOLOGIC/Oncology A:   NSCLC. Anemia of critical illness. P:  F/u CBC  INFECTIOUS A:   No evidence for infection. P:   Monitor off Abx  ENDOCRINE A:   No acute issues. P:   Monitor blood sugar on CBG  Goals of care >> No CPR, no defibrillation.  Family considering withdrawal of vent support.  Chesley Mires, MD [facility] Pulmonary/Critical Care 12/01/2014, 7:16 AM Pager:  [phone] After 3pm call: [phone]

## 2014-12-01 NOTE — Progress Notes (Signed)
ANTICOAGULATION CONSULT NOTE - Follow Up Consult  Pharmacy Consult for heparin Indication: h/o PE and secondary stroke prevention  Labs:  Recent Labs  11/30/14 0213  11/30/14 0919 11/30/14 1350 11/30/14 2016 12/01/14 0400  HGB 7.6*  --  7.5*  --   --  7.1*  HCT 22.5*  --  21.5*  --   --  21.6*  PLT 270  --   --   --   --  236  HEPARINUNFRC  --   --   --   --  0.17* 0.19*  CREATININE 0.84  --   --   --   --   --   TROPONINI  --   < > 19.02* 33.68* 38.35*  --   < > = values in this interval not displayed.    Assessment: 54yo male remains subtherapeutic on heparin with little change in level after rate increase.  Goal of Therapy:  Heparin level 0.3-0.5 units/ml   Plan:  Will increase heparin gtt by 2-3 units/kg/hr to 1200 units/hr and check level in 6hr.  Wynona Neat, PharmD, BCPS  12/01/2014,4:46 AM

## 2014-12-01 NOTE — Progress Notes (Signed)
STROKE TEAM PROGRESS NOTE   HISTORY [name] is an 54 y.o. male with a past medical history significant for HTN, hyperlipidemia, CAD s/p stenting, MI, smoking, newly diagnosed left lung cancer on 2/16, right PE, transferred to [facility] for further evaluation and management of right sulcal SAH. Patient indicated that he was at the [place] center yesterday, about to initiate his chemotherapy, but after going to the bathroom he developed sudden onset of difficulty expressing himself that he describes as " knowing what I wanted to say but couldn't get anything out". The episode lasted for at least 10 minutes and he recalls that he also had some numbness-tingling around the left side of his mouth that then travelled to the left hand. No associated HA, vertigo, double vision, confusion, focal weakness, or vision impairment. Of note, he tells me that the day before he had " numbness and tingling of the left lower face and left arm lasting for 10 or 15 minutes". His LKW was 12/02/2014 at 1400. Patient was sent to [facility] where he had a CT brain that I reviewed myself and demonstrated a small acute subarachnoid hemorrhage within a sulcus at the RIGHT parietal lobe. [name] has been on a combination of aspirin and plavix since previous MI and Lovenox 100 mg was added this past February for PE treatment. Patient was not administered TPA secondary to [facility]. He was admitted to the neuro ICU for further evaluation and treatment.   SUBJECTIVE (INTERVAL HISTORY) No family at the bedside.  Overall his condition has not changed from yesterday. He is still in sedation with Fentanyl continuous and versed PRN. He is moving right UE and LE and trying to pull out the tube. However, he has left hemiplegia. His BP still at low side and on heparin now for acute MI.   OBJECTIVE Temp:  [98.6 F (37 C)-99.3 F (37.4 C)] 98.6 F (37 C) (05/07 0400) Pulse Rate:  [103-123] 117 (05/07 0800) Cardiac Rhythm:  [-] Sinus  tachycardia (05/07 0800) Resp:  [14-23] 16 (05/07 0800) BP: (89-112)/(57-78) 98/71 mmHg (05/07 0800) SpO2:  [98 %-100 %] 100 % (05/07 0800) FiO2 (%):  [40 %] 40 % (05/07 0800) Weight:  [156 lb 12 oz (71.1 kg)] 156 lb 12 oz (71.1 kg) (05/07 0600)  No results for input(s): GLUCAP in the last 168 hours.  Recent Labs Lab 12/09/2014 1535 11/27/14 1228 11/28/14 0235 11/30/14 0213 12/01/14 0400  NA 140 135 133* 133* 132*  K 3.9 3.7 4.2 3.5 4.0  CL 106 102 100* 99* 99*  CO2 '24 22 22 '$ 21* 22  GLUCOSE 103* 115* 143* 128* 150*  BUN 24* '17 15 14 '$ 33*  CREATININE 0.85 0.84 0.82 0.84 1.29*  CALCIUM 9.8 9.1 9.4 9.1 8.9  MG  --  1.8 1.9 2.0 2.4  PHOS  --  3.2 4.2 3.0 4.6    Recent Labs Lab 11/25/2014 1236 12/17/2014 1535  AST 24 31  ALT 40 44  ALKPHOS 108 111  BILITOT <0.20 0.2*  PROT 7.2 7.8  ALBUMIN 3.6 4.3    Recent Labs Lab 12/04/2014 1236  12/14/2014 1535 11/27/14 1455 11/28/14 0235 11/30/14 0213 11/30/14 0919 12/01/14 0400  WBC 21.8*  --  19.2* 16.1* 18.5* 22.4*  --  23.4*  NEUTROABS 16.1*  --  13.4*  --   --   --   --   --   HGB 8.6*  < > 8.8* 7.8* 8.0* 7.6* 7.5* 7.1*  HCT 26.2*  < >  26.8* 23.5* 23.6* 22.5* 21.5* 21.6*  MCV 92.0  --  95.4 92.2 91.5 92.6  --  93.1  PLT 448*  --  474* 382 376 270  --  236  < > = values in this interval not displayed.  Recent Labs Lab 11/29/14 0951 11/30/14 0742 11/30/14 0919 11/30/14 1350 11/30/14 2016  TROPONINI 14.88* 19.18* 19.02* 33.68* 38.35*   No results for input(s): LABPROT, INR in the last 72 hours. No results for input(s): COLORURINE, LABSPEC, [place], GLUCOSEU, HGBUR, BILIRUBINUR, KETONESUR, PROTEINUR, UROBILINOGEN, NITRITE, LEUKOCYTESUR in the last 72 hours.  Invalid input(s): APPERANCEUR     Component Value Date/Time   CHOL 172 11/28/2014 0235   TRIG 166* 11/28/2014 0235   HDL 32* 11/28/2014 0235   CHOLHDL 5.4 11/28/2014 0235   VLDL 33 11/28/2014 0235   LDLCALC 107* 11/28/2014 0235   Lab Results  Component Value  Date   HGBA1C 5.2 11/28/2014   No results found for: LABOPIA, COCAINSCRNUR, [place], [place], THCU, [place]   Recent Labs Lab 12/13/2014 [place] <5   I have personally reviewed the radiological images below and agree with the radiology interpretations.  Dg Chest 2 View 12/01/2014 - Support equipment appears satisfactorily positioned. The lungs are grossly clear. 11/27/2014    1. Persistent mediastinal and left hilar adenopathy. 2. Persistent ill-defined density left upper lobe . No new lesions identified. 12/14/2014    1. Unchanged LEFT upper lobe spiculated pulmonary nodule. 2. RIGHT peritracheal and LEFT hilar adenopathy.     Ct Head Wo Contrast 11/30/14  No new hemorrhage. Subarachnoid hemorrhage in the sulci of the right frontoparietal region becoming less dense over time. Evolutionary changes of infarctions in both hemispheres. Developing low-density becoming more noticeable particularly in the right occipital lobe and in the left frontoparietal region. The occipital changes on the right could represent actual progression. Numerous other small infarctions shown by MRI are not specifically resolved. Small brain metastases shown by MRI are not specifically resolved.  12/12/2014   Acute subarachnoid hemorrhage within a sulcus at the RIGHT parietal lobe.  No other intracranial abnormalities.    [name] Wo Contrast 11/29/14 Image quality degraded by motion. In addition, the patient was not able to tolerate postcontrast imaging due to pain. Small enhancing metastatic deposits seen on the recent MRI of 11/27/2014 not visualized on today's study Mild subarachnoid hemorrhage on the right unchanged Progression of bilateral acute/subacute infarct since the recent MRI. See above description.  11/27/2014    1. 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 No significant mass effect or associated hemorrhage. 2. Additional subcentimeter  focus of restricted diffusion adjacent to the atrium of the right lateral ventricle, also suspicious for small acute ischemic infarct. 3. Stable volume and distribution of acute subarachnoid hemorrhage within the right frontal region as compared to prior CT. No new intracranial hemorrhage. 4. 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 One of these lesions lies in close proximity to the acute subarachnoid hemorrhage within the right frontal lobe, raising the suspicion that this is the source of bleeding.   Adam Hilary Hertz 11/27/2014     5. Normal MRV of the brain.  EEG 11/27/14 - Normal electroencephalogram. There are no focal lateralizing or epileptiform features.  2D echo - - Left ventricle: The cavity size was normal. There was mild focal basal hypertrophy of the  septum. Systolic function was normal. The estimated ejection fraction was in the range of 55% to 60%. There is akinesis of the basalinferior myocardium. There was an increased relative contribution of atrial contraction to ventricular filling. Doppler parameters are consistent with abnormal left ventricular relaxation (grade 1 diastolic dysfunction). - Aortic valve: Trileaflet; mildly thickened leaflets. - Mitral valve: Moderate diffuse thickening. There was trivial regurgitation. - Pulmonic valve: There was mild regurgitation.  PHYSICAL EXAM Intubated and sedated. Afebrile. Head is nontraumatic. Neck is supple without bruit.  Cardiac exam no murmur or gallop, but tachycardia. Lungs are clear to auscultation. Distal pulses are well felt.  Neurological Exam ;  Intubated and sedated with fentanyl drip, just given versed PRN. Eyes are closed even with sternal rub.  Pupils 3 mm reactive. Corneal reflexes are present. Fundi could not be visualized. Gag cough and doll's eyes present. Spontaneous  antigravity purposeful movements on the right side. Dense left hemiplegia with only mild withdrawal in the left lower extremity to pain. Babinski negative bilaterally.  ASSESSMENT/PLAN Adam. TAYTON DECAIRE is a 54 y.o. male with history of HTN, hyperlipidemia, CAD s/p stenting, MI, smoking, newly diagnosed left lung cancer on 2/16, right PE who developed sudden onset of expressive aphasia. CT showed small R parietal SAH. He did not receive IV t-PA due to [facility]. Subsequent repeat CT scans as well as MRI scans have shown progressive bilateral strokes and also brain metastasis. Pt troponin elevation consistent with acute STEMI. On heparin drip and ASA now.  Right SAH  Secondary to triple antithrombotics, including full dose lovenox for PE as well as asprin and plavix for recent stent  Repeat CT showed much improved SAH, largely absorbed   Put back on ASA  EEG no seizure.   TCD pending  Ischemic left frontotemporal and parietal infarcts embolic secondary to hypercoagulable state from lung cancer  Resultant  Neuro deficits resolved initially but progressive neurological worsening with agitation and dense left hemiplegia since 11/29/14  MRI  left frontotemporal and parietal ischemic infarcts. Small R PV ischemic infarct. Multifocal small brain mets.  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  Undergoing chemo treatment  Neurosurgery Sherwood Gambler) consulted  Brain mets felt to be amenable to stereotactic  radiosurgery  Pt not candidate at this time  Pulmonary Embolism  Hypercoagulable from cancer  On full dose lovenox prior to admission  Given 50 protamine  IVC filter planned today  Hypertension  Home meds:   lopressor  On the low side  No pressor yet  Close monitoring  Hyperlipidemia  Home meds:  lipitor 80, resumed in hospital  LDL 107, goal < 70  Continue statin at discharge  Other Stroke Risk Factors  Cigarette smoker, 39 year history  Marijuana use  Coronary artery disease - MI 2006, 2012; stent 2006, 2012, 2015; PCI. Burt Knack)  Other Active Problems  Leukocytosis, no indication of infection  Elevated troponins   Hospital day # 5  This patient is critically ill due to [facility], progressive ischemic infarct, acute MI, lung cancer with brain mets and at significant risk of neurological worsening, death form recurrent brain hemorrhage, progressive infarcts, heart failure, bleeding from brain mets, cerebral edema and herniation. This patient's care requires constant monitoring of vital signs, hemodynamics, respiratory and cardiac monitoring, review of multiple databases, neurological assessment, discussion with family, other specialists and medical decision making of high complexity. I spent 45 minutes of neurocritical care time in the care of this patient.  Rosalin Hawking, MD PhD Stroke Neurology 12/01/2014 8:35 AM       To contact Stroke Continuity provider, please refer to [email or web address] After hours, contact General Neurology

## 2014-12-01 NOTE — Progress Notes (Signed)
eLink Physician-Brief Progress Note Patient Name: [name] DOB: [birth year] MRN: [id number]   Date of Service  12/01/2014  HPI/Events of Note  Oliguria. Urine output 70 mL over last 4 hours.  eICU Interventions  Bolus with 0.9 NaCl 500 mL IV over 3o minutes now.      Intervention Category Intermediate Interventions: Oliguria - evaluation and management  [name] 12/01/2014, 4:55 PM

## 2014-12-01 NOTE — Progress Notes (Signed)
ANTICOAGULATION CONSULT NOTE  Pharmacy Consult for Heparin Indication: secondary stroke prevention  No Known Allergies  Patient Measurements: Height: '5\' 9"'$  (175.3 cm) Weight: 156 lb 12 oz (71.1 kg) IBW/kg (Calculated) : 70.7 Heparin Dosing Weight: 71 kg  Vital Signs: Temp: 98.4 F (36.9 C) (05/07 1200) Temp Source: Axillary (05/07 1200) BP: 109/66 mmHg (05/07 1200) Pulse Rate: 102 (05/07 1200)  Labs:  Recent Labs  11/30/14 0213  11/30/14 0919 11/30/14 1350 11/30/14 2016 12/01/14 0400 12/01/14 1037  HGB 7.6*  --  7.5*  --   --  7.1*  --   HCT 22.5*  --  21.5*  --   --  21.6*  --   PLT 270  --   --   --   --  236  --   HEPARINUNFRC  --   --   --   --  0.17* 0.19* 0.38  CREATININE 0.84  --   --   --   --  1.29*  --   TROPONINI  --   < > 19.02* 33.68* 38.35*  --   --   < > = values in this interval not displayed.  Estimated Creatinine Clearance: 67 mL/min (by C-G formula based on Cr of 1.29).   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  Pt with recent PE on Lovenox PTA. Repeat CT shows much improved SAH (largely absorbed). Pt s/p IVC filter 5/3. Resumed low dose heparin gtt for secondary stroke prevention in high risk pt. Heparin level therapeutic (0.38) (goal 0.3-0.5) on 1300 units/hr. Hgb down to 7.1 - watch. Plt ok. No overt bleeding noted.  Goal of Therapy:  Heparin level 0.3-0.5 units/ml Monitor platelets by anticoagulation protocol: Yes   Plan:  Heparin 1300 units / hr Daily HL, CBC Monitor for s/s bleeding  Sherlon Handing, PharmD, BCPS Clinical pharmacist, pager [phone] 12/01/2014 12:52 PM

## 2014-12-01 NOTE — Progress Notes (Signed)
[place] Progress Note Patient Name: [name] DOB: [birth year] MRN: [id number]   Date of Service  12/01/2014  HPI/Events of Note  Agitation.  eICU Interventions  Will order: 1. Renew restraints.  2. Versed IV infusion. Titrate to RASS 0 to -2.     Intervention Category Minor Interventions: Agitation / anxiety - evaluation and management  Lysle Dingwall 12/01/2014, 8:39 PM

## 2014-12-02 ENCOUNTER — Inpatient Hospital Stay (HOSPITAL_COMMUNITY): Payer: 59

## 2014-12-02 DIAGNOSIS — J9601 Acute respiratory failure with hypoxia: Secondary | ICD-10-CM

## 2014-12-02 LAB — CBC
HEMATOCRIT: 21 % — AB (ref 39.0–52.0)
HEMOGLOBIN: 6.7 g/dL — AB (ref 13.0–17.0)
MCH: 30.2 pg (ref 26.0–34.0)
MCHC: 31.9 g/dL (ref 30.0–36.0)
MCV: 94.6 fL (ref 78.0–100.0)
Platelets: 269 10*3/uL (ref 150–400)
RBC: 2.22 MIL/uL — ABNORMAL LOW (ref 4.22–5.81)
RDW: 14.4 % (ref 11.5–15.5)
WBC: 24.8 10*3/uL — ABNORMAL HIGH (ref 4.0–10.5)

## 2014-12-02 LAB — HEPARIN LEVEL (UNFRACTIONATED): HEPARIN UNFRACTIONATED: 0.32 [IU]/mL (ref 0.30–0.70)

## 2014-12-02 LAB — BASIC METABOLIC PANEL
ANION GAP: 11 (ref 5–15)
BUN: 28 mg/dL — ABNORMAL HIGH (ref 6–20)
CHLORIDE: 103 mmol/L (ref 101–111)
CO2: 22 mmol/L (ref 22–32)
Calcium: 8.8 mg/dL — ABNORMAL LOW (ref 8.9–10.3)
Creatinine, Ser: 0.97 mg/dL (ref 0.61–1.24)
Glucose, Bld: 171 mg/dL — ABNORMAL HIGH (ref 70–99)
POTASSIUM: 4 mmol/L (ref 3.5–5.1)
SODIUM: 136 mmol/L (ref 135–145)

## 2014-12-02 LAB — GLUCOSE, CAPILLARY
GLUCOSE-CAPILLARY: 159 mg/dL — AB (ref 70–99)
GLUCOSE-CAPILLARY: 170 mg/dL — AB (ref 70–99)
Glucose-Capillary: 139 mg/dL — ABNORMAL HIGH (ref 70–99)
Glucose-Capillary: 137 mg/dL — ABNORMAL HIGH (ref 70–99)

## 2014-12-02 MED ORDER — SODIUM CHLORIDE 0.9 % IV SOLN
5.0000 mg/h | INTRAVENOUS | Status: DC
  Administered 2014-12-02: 5 mg/h via INTRAVENOUS
  Administered 2014-12-02: 6 mg/h via INTRAVENOUS
  Administered 2014-12-03: 18 mg/h via INTRAVENOUS
  Filled 2014-12-02 (×2): qty 10

## 2014-12-02 MED ORDER — SODIUM CHLORIDE 0.9 % IV SOLN: Freq: Once | INTRAVENOUS | Status: DC

## 2014-12-02 MED ORDER — MAGNESIUM SULFATE 2 GM/50ML IV SOLN: 2.0000 g | Freq: Once | INTRAVENOUS | Status: DC

## 2014-12-02 MED ORDER — ENOXAPARIN SODIUM 100 MG/ML ~~LOC~~ SOLN
100.0000 mg | SUBCUTANEOUS | Status: DC
  Administered 2014-12-02: 100 mg via SUBCUTANEOUS
  Filled 2014-12-02 (×2): qty 1

## 2014-12-02 MED ORDER — DEXTROSE-NACL 5-0.45 % IV SOLN
INTRAVENOUS | Status: DC
  Administered 2014-12-02: 11:00:00 via INTRAVENOUS

## 2014-12-02 NOTE — Progress Notes (Signed)
[place] Progress Note Patient Name: [name] DOB: 08/03/1960 MRN: [id number]   Date of Service  12/02/2014  HPI/Events of Note  Plan was for comfort care when family present. Family is now present and I confirmed this plan and expectations and with the patient's wife. Questions answered.   eICU Interventions  Will make the patient a DNR/Comfort measures, extubate to [place] O2, D/C Fentanyl and Versed IV infusion. Start Morphine IV infusion for patient comfort.      Intervention Category Minor Interventions: Routine modifications to care plan (e.g. PRN medications for pain, fever)  Kasper Mudrick Eugene 12/02/2014, 4:26 PM

## 2014-12-02 NOTE — Progress Notes (Signed)
PULMONARY / CRITICAL CARE MEDICINE   Name: [name] MRN: [id number] DOB: [birth year]    ADMISSION DATE:  12/12/2014 CONSULTATION DATE:  12/02/2014  REFERRING MD :  EDP  CHIEF COMPLAINT:  Speech difficulties  INITIAL PRESENTATION:   54 yo male with slurred speech from [facility].  He had recent dx of Stage IV NSCLC, PE, and has hx of CAD.  STUDIES:  CT head 5/2 >>> acute SAH within sulcus at the right parietal lobe EEG 5/03 >>> normal Echo 5/04 >>> EF 55 to 94%, grade 1 diastolic dysfx CT head 5/6 >>> new Rt occipital and Lt frontoparietal strokes  SIGNIFICANT EVENTS: 5/2 admit with Rt SAH 5/3 IVC filter placed 5/6 restroke overnight and new MI requiring intubation overnight.  SUBJECTIVE: Tolerated pressure support.  Agitated overnight and required more sedation.  VITAL SIGNS: Temp:  [97.6 F (36.4 C)-99.8 F (37.7 C)] 98.9 F (37.2 C) (05/08 0424) Pulse Rate:  [100-117] 104 (05/08 0700) Resp:  [13-21] 16 (05/08 0700) BP: (94-127)/(60-78) 116/73 mmHg (05/08 0700) SpO2:  [99 %-100 %] 100 % (05/08 0700) FiO2 (%):  [40 %] 40 % (05/08 0400) Weight:  [155 lb 10.3 oz (70.6 kg)] 155 lb 10.3 oz (70.6 kg) (05/08 0500) VENTILATOR SETTINGS: Vent Mode:  [-] PRVC FiO2 (%):  [40 %] 40 % Set Rate:  [14 bmp] 14 bmp Vt Set:  [570 mL] 570 mL PEEP:  [5 cmH20] 5 cmH20 Pressure Support:  [5 cmH20-8 cmH20] 8 cmH20 Plateau Pressure:  [12 cmH20-16 cmH20] 16 cmH20 INTAKE / OUTPUT: Intake/Output      05/07 0701 - 05/08 0700 05/08 0701 - 05/09 0700   I.V. (mL/kg) 685 (9.7)    NG/GT 1550    IV Piggyback 500    Total Intake(mL/kg) 2735 (38.7)    Urine (mL/kg/hr) 1095 (0.6)    Total Output 1095     Net +1640           PHYSICAL EXAMINATION: General: no distress Neuro: RASS -2, moves Rt side HEENT: ETT in place Cardiovascular: regular, tachycardic Lungs: no wheeze Abdomen: soft Musculoskeletal: No edema Skin:no rashes  LABS:  PULMONARY  Recent Labs Lab 11/30/14 0730 12/01/14 0323   PHART 7.417 7.461*  PCO2ART 31.2* 30.5*  PO2ART 357* 153*  HCO3 19.7* 21.4  TCO2 20.7 22.4  O2SAT 100.0 99.9   CBC  Recent Labs Lab 11/30/14 0213 11/30/14 0919 12/01/14 0400 12/02/14 0241  HGB 7.6* 7.5* 7.1* 6.7*  HCT 22.5* 21.5* 21.6* 21.0*  WBC 22.4*  --  23.4* 24.8*  PLT 270  --  236 269   COAGULATION  Recent Labs Lab 11/27/2014 1535  INR 1.10   CARDIAC    Recent Labs Lab 11/29/14 0951 11/30/14 0742 11/30/14 0919 11/30/14 1350 11/30/14 2016  TROPONINI 14.88* 19.18* 19.02* 33.68* 38.35*   CHEMISTRY  Recent Labs Lab 11/27/14 1228 11/28/14 0235 11/30/14 0213 12/01/14 0400 12/02/14 0241  NA 135 133* 133* 132* 136  K 3.7 4.2 3.5 4.0 4.0  CL 102 100* 99* 99* 103  CO2 22 22 21* 22 22  GLUCOSE 115* 143* 128* 150* 171*  BUN '17 15 14 '$ 33* 28*  CREATININE 0.84 0.82 0.84 1.29* 0.97  CALCIUM 9.1 9.4 9.1 8.9 8.8*  MG 1.8 1.9 2.0 2.4  --   PHOS 3.2 4.2 3.0 4.6  --    Estimated Creatinine Clearance: 89 mL/min (by C-G formula based on Cr of 0.97).  LIVER  Recent Labs Lab 12/25/2014 1236 12/20/2014 1535  AST 24 31  ALT 40 44  ALKPHOS 108 111  BILITOT <0.20 0.2*  PROT 7.2 7.8  ALBUMIN 3.6 4.3  INR  --  1.10   IMAGING x48h Dg Chest Port 1 View  12/01/2014   CLINICAL DATA:  Acute respiratory failure.  EXAM: PORTABLE CHEST - 1 VIEW  COMPARISON:  11/30/2014  FINDINGS: The endotracheal tube tip is 4.2 cm above the carina. The nasogastric tube extends into the stomach. The lungs are clear. There is no pneumothorax. There is no large effusion. There are unchanged mass contours in the upper mediastinum.  IMPRESSION: Support equipment appears satisfactorily positioned.  The lungs are grossly clear.   Electronically Signed   By: Andreas Newport M.D.   On: 12/01/2014 05:36   Dg Abd Portable 1v  11/30/2014   CLINICAL DATA:  54 year old male with a history of feeding tube  EXAM: PORTABLE ABDOMEN - 1 VIEW  COMPARISON:  11/27/2014  FINDINGS: Gas within small bowel and  colon. No abnormally distended small bowel or colon.  Gastric tube projects over the lower mediastinum, terminating in the left upper quadrant. Side port terminates near the gastroesophageal junction.  IVC filter.  No displaced fracture.  No unexpected calcifications.  IMPRESSION: Gastric tube terminates in the left upper quadrant, overlying the stomach. The side port terminates near the GE junction.  Nonobstructive bowel gas pattern.  Signed,  Dulcy Fanny. Earleen Newport, DO  [facility]  [facility]   Electronically Signed   By: Corrie Mckusick D.O.   On: 11/30/2014 12:33   ASSESSMENT / PLAN:  NEUROLOGIC A:   Acute SAH on Admit. New large Rt CVA 5/03. P:   Per neurology  PULMONARY A: Acute respiratory failure 2nd to mental status. Hx of smoking. P:   Pressure support wean as tolerated >> respiratory mechanics support extubation trial, but concerned about his ability to protect airway.  CARDIOVASCULAR A:  Dx of PE in February. Hx HTN, HLD, MI, CAD s/p DES 2006 - on plavix and aspirin. Elevated troponin 2nd to NSTEMI. P:  Resumed ASA Resumed heparin gtt Continue lipitor  RENAL A:   AKI >> improving. P:   Monitor renal fx  GASTROINTESTINAL A:   Nutrition. P:   Tube feeds  HEMATOLOGIC/Oncology A:   NSCLC. Anemia of critical illness. P:  F/u CBC Transfuse for Hb < 7 Check iron studies  INFECTIOUS A:   No evidence for infection. P:   Monitor off Abx  ENDOCRINE A:   Hyperglycemia P:   SSI  Goals of care >> No CPR, no defibrillation.  Family considering withdrawal of vent support.  Need to have input from neurology about long term prognosis before family decides about whether to withdraw vent.  Updated pt's wife and daughters at bedside.  CC time 35 minutes.  Chesley Mires, MD [facility] Pulmonary/Critical Care 12/02/2014, 7:22 AM Pager:  [phone] After 3pm call: [phone]

## 2014-12-02 NOTE — Progress Notes (Signed)
**Note De-Identified  Obfuscation** Patient extubated and placed on 2L [place].

## 2014-12-02 NOTE — Progress Notes (Signed)
Pt family spoke with neurology and understand his chance for recovery is not good.  They have opted to transition to comfort measures.  I reviewed with them the process for vent withdrawal, and medications that will be used to ensure his comfort.  They want to continue anticoagulation because they are concerned that he could "stroke out" if his blood thinners are stopped.  Will have pharmacy dose lovenox and then d/c heparin gtt.  Will also continue IV fluids with dextrose >> family was concerned he would waste away without any fluids or caloric intake.  They would not want feeding tubes.  They are gathering remainder of family.  Once they are ready will then place order to have ETT removed.  Explained that time course for dying process is unpredictable, and that he might need to transfer to floor bed after ETT removed.  Chesley Mires, MD [facility] Pulmonary/Critical Care 12/02/2014, 9:30 AM Pager:  [phone] After 3pm call: [phone]

## 2014-12-02 NOTE — Progress Notes (Signed)
CRITICAL VALUE ALERT  Critical value received:  hgb  Date of notification:  5/8  Time of notification:  0308  Critical value read back:yes  Nurse who received alert:  Jamelle Haring  MD notified (1st page):  CCM  Time of first page:  3:10  MD notified (2nd page):  Time of second page:  Responding MD:  ccm  Time MD responded:  3:14

## 2014-12-02 NOTE — Progress Notes (Signed)
Patient extubated and placed on 2L [place]. Mouth care provided. Comfort measures provided for family. Right wrist restraint removed. Thayer Ohm D

## 2014-12-02 NOTE — Progress Notes (Signed)
STROKE TEAM PROGRESS NOTE   SUBJECTIVE (INTERVAL HISTORY) Wife and daughters and many family members are at the bedside.  Overall his condition has not changed from yesterday. He is still in sedation with Fentanyl and versed drip. He is moving right UE and LE spontaneously but with left hemiplegia. His BP still at low side and on heparin drip now for acute MI and neurological deterioration.   OBJECTIVE Temp:  [97.6 F (36.4 C)-100.9 F (38.3 C)] 100.4 F (38 C) (05/08 1150) Pulse Rate:  [100-113] 106 (05/08 1200) Cardiac Rhythm:  [-] Sinus tachycardia (05/08 1200) Resp:  [13-22] 19 (05/08 1200) BP: (94-127)/(60-78) 108/70 mmHg (05/08 1200) SpO2:  [99 %-100 %] 100 % (05/08 1200) FiO2 (%):  [40 %] 40 % (05/08 1200) Weight:  [155 lb 10.3 oz (70.6 kg)] 155 lb 10.3 oz (70.6 kg) (05/08 0500)   Recent Labs Lab 12/01/14 2017 12/02/14 0006 12/02/14 0349 12/02/14 0820 12/02/14 1148  GLUCAP 164* 139* 159* 170* 137*    Recent Labs Lab 11/27/14 1228 11/28/14 0235 11/30/14 0213 12/01/14 0400 12/02/14 0241  NA 135 133* 133* 132* 136  K 3.7 4.2 3.5 4.0 4.0  CL 102 100* 99* 99* 103  CO2 22 22 21* 22 22  GLUCOSE 115* 143* 128* 150* 171*  BUN '17 15 14 '$ 33* 28*  CREATININE 0.84 0.82 0.84 1.29* 0.97  CALCIUM 9.1 9.4 9.1 8.9 8.8*  MG 1.8 1.9 2.0 2.4  --   PHOS 3.2 4.2 3.0 4.6  --     Recent Labs Lab 12/06/2014 1236 11/27/2014 1535  AST 24 31  ALT 40 44  ALKPHOS 108 111  BILITOT <0.20 0.2*  PROT 7.2 7.8  ALBUMIN 3.6 4.3    Recent Labs Lab 12/21/2014 1236  12/07/2014 1535 11/27/14 1455 11/28/14 0235 11/30/14 0213 11/30/14 0919 12/01/14 0400 12/02/14 0241  WBC 21.8*  < > 19.2* 16.1* 18.5* 22.4*  --  23.4* 24.8*  NEUTROABS 16.1*  --  13.4*  --   --   --   --   --   --   HGB 8.6*  < > 8.8* 7.8* 8.0* 7.6* 7.5* 7.1* 6.7*  HCT 26.2*  < > 26.8* 23.5* 23.6* 22.5* 21.5* 21.6* 21.0*  MCV 92.0  < > 95.4 92.2 91.5 92.6  --  93.1 94.6  PLT 448*  < > 474* 382 376 270  --  236 269  < > =  values in this interval not displayed.  Recent Labs Lab 11/29/14 0951 11/30/14 0742 11/30/14 0919 11/30/14 1350 11/30/14 2016  TROPONINI 14.88* 19.18* 19.02* 33.68* 38.35*   No results for input(s): LABPROT, INR in the last 72 hours. No results for input(s): COLORURINE, LABSPEC, [place], GLUCOSEU, HGBUR, BILIRUBINUR, KETONESUR, PROTEINUR, UROBILINOGEN, NITRITE, LEUKOCYTESUR in the last 72 hours.  Invalid input(s): APPERANCEUR     Component Value Date/Time   CHOL 172 11/28/2014 0235   TRIG 166* 11/28/2014 0235   HDL 32* 11/28/2014 0235   CHOLHDL 5.4 11/28/2014 0235   VLDL 33 11/28/2014 0235   LDLCALC 107* 11/28/2014 0235   Lab Results  Component Value Date   HGBA1C 5.2 11/28/2014   No results found for: LABOPIA, COCAINSCRNUR, [place], [place], THCU, [place]   Recent Labs Lab 12/20/2014 [place] <5   I have personally reviewed the radiological images below and agree with the radiology interpretations.  Dg Chest 2 View 12/01/2014 - Support equipment appears satisfactorily positioned. The lungs are grossly clear. 11/27/2014    1. Persistent mediastinal and  left hilar adenopathy. 2. Persistent ill-defined density left upper lobe . No new lesions identified. 12/15/2014    1. Unchanged LEFT upper lobe spiculated pulmonary nodule. 2. RIGHT peritracheal and LEFT hilar adenopathy.     Ct Head Wo Contrast 11/30/14  No new hemorrhage. Subarachnoid hemorrhage in the sulci of the right frontoparietal region becoming less dense over time. Evolutionary changes of infarctions in both hemispheres. Developing low-density becoming more noticeable particularly in the right occipital lobe and in the left frontoparietal region. The occipital changes on the right could represent actual progression. Numerous other small infarctions shown by MRI are not specifically resolved. Small brain metastases shown by MRI are not specifically resolved.  12/11/2014   Acute subarachnoid hemorrhage within a  sulcus at the RIGHT parietal lobe.  No other intracranial abnormalities.    Mr Jeri Cos Wo Contrast 11/29/14 Image quality degraded by motion. In addition, the patient was not able to tolerate postcontrast imaging due to pain. Small enhancing metastatic deposits seen on the recent MRI of 11/27/2014 not visualized on today's study Mild subarachnoid hemorrhage on the right unchanged Progression of bilateral acute/subacute infarct since the recent MRI. See above description.  11/27/2014    1. 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 No significant mass effect or associated hemorrhage. 2. Additional subcentimeter focus of restricted diffusion adjacent to the atrium of the right lateral ventricle, also suspicious for small acute ischemic infarct. 3. Stable volume and distribution of acute subarachnoid hemorrhage within the right frontal region as compared to prior CT. No new intracranial hemorrhage. 4. 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 One of these lesions lies in close proximity to the acute subarachnoid hemorrhage within the right frontal lobe, raising the suspicion that this is the source of bleeding.   [name] 11/27/2014     5. Normal MRV of the brain.  EEG 11/27/14 - Normal electroencephalogram. There are no focal lateralizing or epileptiform features.  2D echo - - Left ventricle: The cavity size was normal. There was mild focal basal hypertrophy of the septum. Systolic function was normal. The estimated ejection fraction was in the range of 55% to 60%. There is akinesis of the basalinferior myocardium. There was an increased relative contribution of atrial contraction to ventricular filling. Doppler parameters are consistent with abnormal left ventricular relaxation (grade 1  diastolic dysfunction). - Aortic valve: Trileaflet; mildly thickened leaflets. - Mitral valve: Moderate diffuse thickening. There was trivial regurgitation. - Pulmonic valve: There was mild regurgitation.  PHYSICAL EXAM Intubated and sedated. Afebrile. Head is nontraumatic. Neck is supple without bruit.  Cardiac exam no murmur or gallop, but tachycardia. Lungs are clear to auscultation. Distal pulses are well felt.  Neurological Exam ;  Intubated and sedated with fentanyl and versed drip. Eyes are closed even with sternal rub.  Pupils 3 mm reactive. Corneal reflexes are present. Fundi could not be visualized. Gag cough and doll's eyes present. Spontaneous antigravity purposeful movements on the right side with painful stimuli. Dense left hemiplegia with only mild withdrawal in the left lower extremity to pain. Babinski negative bilaterally.  ASSESSMENT/PLAN [name] is a 54 y.o. male with history of HTN, hyperlipidemia, CAD s/p stenting, MI, smoking, newly diagnosed left lung cancer on 2/16, right PE who developed sudden onset of expressive aphasia. CT showed small R parietal SAH. He did not  receive IV t-PA due to [facility]. Subsequent repeat CT scans as well as MRI scans have shown progressive bilateral strokes and also brain metastasis. Pt troponin elevation consistent with acute STEMI. On heparin drip and ASA now.  Right SAH  Secondary to triple antithrombotics, including full dose lovenox for PE as well as asprin and plavix for recent stent  Repeat CT showed much improved SAH, largely absorbed   Put back on ASA  EEG no seizure.   TCD pending report  Ischemic left frontotemporal and parietal infarcts embolic secondary to hypercoagulable state from lung cancer  Resultant  Neuro deficits resolved initially but progressive neurological worsening with agitation and dense left hemiplegia since 11/29/14  MRI  left frontotemporal and parietal ischemic infarcts. Small R PV ischemic  infarct. Multifocal small brain mets.  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  Undergoing chemo treatment  Neurosurgery Sherwood Gambler) consulted  Brain mets felt to be amenable to stereotactic radiosurgery  Pt not candidate at this time  Pulmonary Embolism  Hypercoagulable from cancer  On full dose lovenox prior to admission  Given 50 protamine  IVC filter planned today  Hypertension  Home meds:   lopressor  On the low side  No pressor yet  Close monitoring  Hyperlipidemia  Home meds:  lipitor 80, resumed in hospital  LDL 107, goal < 70  Continue statin at discharge  Other Stroke Risk Factors  Cigarette smoker, 39 year history  Marijuana use  Coronary artery disease - MI 2006, 2012; stent 2006, 2012, 2015; PCI. Burt Knack)  Other Active Problems  Leukocytosis, no indication of infection  Elevated troponins   Hospital day # 6  This patient is critically ill due to [facility], progressive ischemic infarct, acute MI, lung cancer with brain mets and at significant risk of neurological worsening, death form recurrent brain hemorrhage, progressive infarcts, heart failure, bleeding from brain mets, cerebral edema and herniation. This patient's care requires constant monitoring of vital signs, hemodynamics, respiratory and cardiac monitoring, review of multiple databases, neurological assessment, discussion with family, other specialists and medical decision making of high complexity. I  spent 45 minutes of neurocritical care time in the care of this patient.  Have a long discussion with wife, daughters, and multiple family members at best and again today, updated patient current condition, diagnosis, treatment plan, and poor prognosis. Family stated that patient would not wish to leaving the condition without quality of life and without meaningful neurological recovery. They also understand lung cancer with metastasis is also a terminal condition especially in the current condition with acute stroke and acute MI. They would like to proceed with comfort care, will request palliative care consultation.  Rosalin Hawking, MD PhD Stroke Neurology 12/02/2014 12:59 PM       To contact Stroke Continuity provider, please refer to [email or web address] After hours, contact General Neurology

## 2014-12-02 NOTE — Progress Notes (Signed)
Palliative Medicine Team consult was received. I have reviewed the chart and noted goals discussion by CCM-awaiting family arrival for terminal extubation-our team will shadow the chart and engage with family after extubation for any additional goals and symptom management needs- may be overwhelming for them today to have another detailed discussion about transition to comfort care. If there are urgent needs or questions please call [phone]. Thank you for consulting out team to assist with this patients care.

## 2014-12-02 NOTE — Progress Notes (Signed)
[place] for Heparin -> Lovenox Indication: secondary stroke prevention  No Known Allergies  Patient Measurements: Height: '5\' 9"'$  (175.3 cm) Weight: 155 lb 10.3 oz (70.6 kg) IBW/kg (Calculated) : 70.7 Heparin Dosing Weight: 71 kg  Vital Signs: Temp: 100.9 F (38.3 C) (05/08 0800) Temp Source: Axillary (05/08 0800) BP: 113/68 mmHg (05/08 0900) Pulse Rate: 103 (05/08 0900)  Labs:  Recent Labs  11/30/14 0213  11/30/14 0919 11/30/14 1350  11/30/14 2016 12/01/14 0400 12/01/14 1037 12/02/14 0241  HGB 7.6*  --  7.5*  --   --   --  7.1*  --  6.7*  HCT 22.5*  --  21.5*  --   --   --  21.6*  --  21.0*  PLT 270  --   --   --   --   --  236  --  269  HEPARINUNFRC  --   --   --   --   < > 0.17* 0.19* 0.38 0.32  CREATININE 0.84  --   --   --   --   --  1.29*  --  0.97  TROPONINI  --   < > 19.02* 33.68*  --  38.35*  --   --   --   < > = values in this interval not displayed.  Estimated Creatinine Clearance: 89 mL/min (by C-G formula based on Cr of 0.97).   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  Pt with recent PE on Lovenox PTA. Repeat CT shows much improved SAH (largely absorbed). Pt s/p IVC filter 5/3. Resumed low dose heparin gtt for secondary stroke prevention in high risk pt. Heparin level therapeutic (0.32) (goal 0.3-0.5) on 1300 units/hr. Hgb down to 6.7 - watch. Plt ok. No overt bleeding noted.  Noted plan to transition to comfort measures/vent withdrawal. Family would like to continue anticoagulation due to concern that he could "stroke out" if his blood thinners are stopped. Plan to change heparin to lovenox for patient comfort/less monitoring.   Goal of Therapy:  Anti-Xa level 0.6-1 units/ml (4 hours post dose)  Monitor platelets by anticoagulation protocol: Yes   Plan:  D/c heparin and associated labs Lovenox '100mg'$  SQ q24h (first dose 1 hour after heparin d/c) Monitor for  s/s bleeding  Sherlon Handing, PharmD, BCPS Clinical pharmacist, pager [phone] 12/02/2014 10:04 AM

## 2014-12-03 ENCOUNTER — Encounter (HOSPITAL_COMMUNITY): Payer: Self-pay | Admitting: *Deleted

## 2014-12-03 ENCOUNTER — Other Ambulatory Visit: Payer: 59

## 2014-12-03 ENCOUNTER — Ambulatory Visit: Payer: 59

## 2014-12-03 ENCOUNTER — Ambulatory Visit: Payer: 59 | Admitting: Physician Assistant

## 2014-12-03 DIAGNOSIS — Z01818 Encounter for other preprocedural examination: Secondary | ICD-10-CM

## 2014-12-03 DIAGNOSIS — F919 Conduct disorder, unspecified: Secondary | ICD-10-CM

## 2014-12-03 DIAGNOSIS — R4689 Other symptoms and signs involving appearance and behavior: Secondary | ICD-10-CM | POA: Insufficient documentation

## 2014-12-03 MED ORDER — WHITE PETROLATUM GEL
Status: AC
  Administered 2014-12-03
  Filled 2014-12-03: qty 1

## 2014-12-04 ENCOUNTER — Ambulatory Visit: Payer: 59

## 2014-12-04 ENCOUNTER — Ambulatory Visit: Payer: 59 | Admitting: Radiation Oncology

## 2014-12-05 ENCOUNTER — Ambulatory Visit: Payer: 59

## 2014-12-05 ENCOUNTER — Telehealth: Payer: Self-pay

## 2014-12-05 NOTE — Telephone Encounter (Signed)
On 22-Dec-2014 I received a death certificate from [place] and [facility] on [employer]. The death certificate was for burial. This patient is a patient of Doctor Titus Mould. Upon his request the death certificate will be taken to 3100 (15M) for signature this afternoon. On 12-22-14 I received the death certificate back completed. I called the funeral home to let them know the death certificate was ready for pick-up.

## 2014-12-06 ENCOUNTER — Ambulatory Visit: Payer: 59

## 2014-12-07 ENCOUNTER — Ambulatory Visit: Payer: 59

## 2014-12-10 ENCOUNTER — Ambulatory Visit: Payer: 59

## 2014-12-10 ENCOUNTER — Other Ambulatory Visit: Payer: 59

## 2014-12-11 ENCOUNTER — Ambulatory Visit: Payer: 59

## 2014-12-12 ENCOUNTER — Ambulatory Visit: Payer: 59

## 2014-12-13 ENCOUNTER — Ambulatory Visit: Payer: 59

## 2014-12-13 ENCOUNTER — Ambulatory Visit: Payer: 59 | Admitting: Radiation Oncology

## 2014-12-13 ENCOUNTER — Ambulatory Visit: Admit: 2014-12-13 | Payer: 59 | Admitting: Radiation Oncology

## 2014-12-14 ENCOUNTER — Ambulatory Visit: Payer: 59

## 2014-12-17 ENCOUNTER — Other Ambulatory Visit: Payer: 59

## 2014-12-17 ENCOUNTER — Ambulatory Visit: Payer: 59

## 2014-12-17 NOTE — Discharge Summary (Signed)
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 Studies at his hospitalization __________ including EEG which was normal on May 3.  Echocardiogram on May 4 with an ejection fraction of 55-60% with grade 1 diastolic dysfunction.  A CT scan of the head on May 6 with worsening neurologic status within the right occipital and left frontal parietal strokes.  Significant events including his admission on May 2. The patient's IVC filter placed on May 3 because of his inability to continue anticoagulation secondary to subarachnoid hemorrhage __________ strokes ischemic in nature overnight on May 6, requiring intubation and unfortunately the concern was that he had brain mets and comfort care was initiated.  After further discussion with the wife and the patient expired __________ on the floor.  FINAL DIAGNOSES: 1. __________ metastatic non-small cell lung cancer. 2. Subarachnoid hemorrhage, likely related to metastatic disease. 3. Ischemic right occipital and left frontoparietal strokes.     Raylene Miyamoto, MD     DJF/MEDQ  D:  12/16/2014  T:  12/17/2014  Job:  [id number]

## 2014-12-18 ENCOUNTER — Ambulatory Visit: Payer: 59

## 2014-12-19 ENCOUNTER — Ambulatory Visit: Payer: 59

## 2014-12-20 ENCOUNTER — Ambulatory Visit: Payer: 59

## 2014-12-21 ENCOUNTER — Ambulatory Visit: Payer: 59

## 2014-12-25 ENCOUNTER — Ambulatory Visit: Payer: 59

## 2014-12-25 ENCOUNTER — Other Ambulatory Visit: Payer: 59

## 2014-12-26 ENCOUNTER — Ambulatory Visit: Payer: 59

## 2014-12-26 NOTE — Progress Notes (Signed)
Chaplain responded to spiritual care consult for end of life. Chaplain introduced herself to pt spouse at bedside and informed her of chaplain support as needed. Pt wife expressed some concern about pt's mother. Pt wife says "I know what to expects." She seems to have realistic expectations and is grieving. Pt wife described pt as a "jack-of-all-trades" and a loving man. Page chaplain as needed.   Dec 27, 2014 1000  Clinical Encounter Type  Visited With Family  Visit Type Follow-up;Spiritual support  Referral From Palliative care team  Spiritual Encounters  Spiritual Needs Emotional;Prayer;Grief support  Stress Factors  Family Stress Factors Loss;Family relationships  Fread Kottke, Barbette Hair, Chaplain 12/27/2014 10:52 AM

## 2014-12-26 NOTE — Progress Notes (Signed)
Nutrition Brief Note  Chart reviewed. Pt now transitioning to comfort care.  No further nutrition interventions warranted at this time.  Please re-consult as needed.   Kenzo Ozment RD, LDN, CNSC [phone] Pager [phone] After Hours Pager    

## 2014-12-26 NOTE — Progress Notes (Signed)
STROKE TEAM PROGRESS NOTE   SUBJECTIVE (INTERVAL HISTORY) Wife is at the bedside.  Family requested comfort care yesterday and pt was extubated and put on morphine drip. Rest comfortably. Dr. Titus Mould discussed with wife at bedside.   OBJECTIVE Pulse Rate:  [79-104] 104 (05/09 0300) Cardiac Rhythm:  [-] Sinus tachycardia (05/08 1600) Resp:  [11-16] 11 (05/09 0300) BP: (109-115)/(58-73) 110/58 mmHg (05/09 0300) SpO2:  [92 %-100 %] 96 % (05/09 0300) FiO2 (%):  [40 %] 40 % (05/08 1700)   Recent Labs Lab 12/01/14 2017 12/02/14 0006 12/02/14 0349 12/02/14 0820 12/02/14 1148  GLUCAP 164* 139* 159* 170* 137*    Recent Labs Lab 11/27/14 1228 11/28/14 0235 11/30/14 0213 12/01/14 0400 12/02/14 0241  NA 135 133* 133* 132* 136  K 3.7 4.2 3.5 4.0 4.0  CL 102 100* 99* 99* 103  CO2 22 22 21* 22 22  GLUCOSE 115* 143* 128* 150* 171*  BUN '17 15 14 '$ 33* 28*  CREATININE 0.84 0.82 0.84 1.29* 0.97  CALCIUM 9.1 9.4 9.1 8.9 8.8*  MG 1.8 1.9 2.0 2.4  --   PHOS 3.2 4.2 3.0 4.6  --     Recent Labs Lab 11/25/2014 1535  AST 31  ALT 44  ALKPHOS 111  BILITOT 0.2*  PROT 7.8  ALBUMIN 4.3    Recent Labs Lab 12/10/2014 1535 11/27/14 1455 11/28/14 0235 11/30/14 0213 11/30/14 0919 12/01/14 0400 12/02/14 0241  WBC 19.2* 16.1* 18.5* 22.4*  --  23.4* 24.8*  NEUTROABS 13.4*  --   --   --   --   --   --   HGB 8.8* 7.8* 8.0* 7.6* 7.5* 7.1* 6.7*  HCT 26.8* 23.5* 23.6* 22.5* 21.5* 21.6* 21.0*  MCV 95.4 92.2 91.5 92.6  --  93.1 94.6  PLT 474* 382 376 270  --  236 269    Recent Labs Lab 11/29/14 0951 11/30/14 0742 11/30/14 0919 11/30/14 1350 11/30/14 2016  TROPONINI 14.88* 19.18* 19.02* 33.68* 38.35*   No results for input(s): LABPROT, INR in the last 72 hours. No results for input(s): COLORURINE, LABSPEC, [place], GLUCOSEU, HGBUR, BILIRUBINUR, KETONESUR, PROTEINUR, UROBILINOGEN, NITRITE, LEUKOCYTESUR in the last 72 hours.  Invalid input(s): APPERANCEUR     Component Value Date/Time    CHOL 172 11/28/2014 0235   TRIG 166* 11/28/2014 0235   HDL 32* 11/28/2014 0235   CHOLHDL 5.4 11/28/2014 0235   VLDL 33 11/28/2014 0235   LDLCALC 107* 11/28/2014 0235   Lab Results  Component Value Date   HGBA1C 5.2 11/28/2014   No results found for: LABOPIA, COCAINSCRNUR, [place], [place], [facility], [place]   Recent Labs Lab 11/25/2014 [place] <5   I have personally reviewed the radiological images below and agree with the radiology interpretations.  Dg Chest 2 View 12/01/2014 - Support equipment appears satisfactorily positioned. The lungs are grossly clear. 11/27/2014    1. Persistent mediastinal and left hilar adenopathy. 2. Persistent ill-defined density left upper lobe . No new lesions identified. 12/11/2014    1. Unchanged LEFT upper lobe spiculated pulmonary nodule. 2. RIGHT peritracheal and LEFT hilar adenopathy.     Ct Head Wo Contrast 11/30/14  No new hemorrhage. Subarachnoid hemorrhage in the sulci of the right frontoparietal region becoming less dense over time. Evolutionary changes of infarctions in both hemispheres. Developing low-density becoming more noticeable particularly in the right occipital lobe and in the left frontoparietal region. The occipital changes on the right could represent actual progression. Numerous other small infarctions shown by MRI are  not specifically resolved. Small brain metastases shown by MRI are not specifically resolved.  11/28/2014   Acute subarachnoid hemorrhage within a sulcus at the RIGHT parietal lobe.  No other intracranial abnormalities.    Mr Jeri Cos Wo Contrast 11/29/14 Image quality degraded by motion. In addition, the patient was not able to tolerate postcontrast imaging due to pain. Small enhancing metastatic deposits seen on the recent MRI of 11/27/2014 not visualized on today's study Mild subarachnoid hemorrhage on the right unchanged Progression of bilateral acute/subacute infarct since the recent MRI. See above  description.  11/27/2014    1. 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 No significant mass effect or associated hemorrhage. 2. Additional subcentimeter focus of restricted diffusion adjacent to the atrium of the right lateral ventricle, also suspicious for small acute ischemic infarct. 3. Stable volume and distribution of acute subarachnoid hemorrhage within the right frontal region as compared to prior CT. No new intracranial hemorrhage. 4. 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 One of these lesions lies in close proximity to the acute subarachnoid hemorrhage within the right frontal lobe, raising the suspicion that this is the source of bleeding.   [name] 11/27/2014     5. Normal MRV of the brain.  EEG 11/27/14 - Normal electroencephalogram. There are no focal lateralizing or epileptiform features.  2D echo - - Left ventricle: The cavity size was normal. There was mild focal basal hypertrophy of the septum. Systolic function was normal. The estimated ejection fraction was in the range of 55% to 60%. There is akinesis of the basalinferior myocardium. There was an increased relative contribution of atrial contraction to ventricular filling. Doppler parameters are consistent with abnormal left ventricular relaxation (grade 1 diastolic dysfunction). - Aortic valve: Trileaflet; mildly thickened leaflets. - Mitral valve: Moderate diffuse thickening. There was trivial regurgitation. - Pulmonic valve: There was mild regurgitation.  PHYSICAL EXAM Intubated and sedated. Afebrile. Head is nontraumatic. Neck is supple without bruit.  Cardiac exam no murmur or gallop, but tachycardia. Lungs are clear to auscultation. Distal pulses are well felt.  Neurological Exam ;  Intubated and sedated  with fentanyl and versed drip. Eyes are closed even with sternal rub.  Pupils 3 mm reactive. Corneal reflexes are present. Fundi could not be visualized. Gag cough and doll's eyes present. Spontaneous antigravity purposeful movements on the right side with painful stimuli. Dense left hemiplegia with only mild withdrawal in the left lower extremity to pain. Babinski negative bilaterally.  ASSESSMENT/PLAN Mr. BOBBYE REINITZ is a 54 y.o. male with history of HTN, hyperlipidemia, CAD s/p stenting, MI, smoking, newly diagnosed left lung cancer on 2/16, right PE who developed sudden onset of expressive aphasia. CT showed small R parietal SAH. He did not receive IV t-PA due to [facility]. Subsequent repeat CT scans as well as MRI scans have shown progressive bilateral strokes and also brain metastasis. Pt troponin elevation consistent with acute STEMI. On heparin drip and ASA. Given  Poor prognosis and terminal condition, family request comfort care and he was extubated yesterday and on morphine drip.  Right SAH  Secondary to triple antithrombotics, including full dose lovenox for PE as well as asprin and plavix for recent stent  Repeat CT showed much improved SAH, largely absorbed   Put back on ASA. Now comfort care, ASA dc'ed.  EEG no seizure.  Ischemic left frontotemporal and parietal infarcts embolic secondary to hypercoagulable state from lung cancer  Resultant  Neuro deficits resolved initially but progressive neurological worsening with agitation and dense left hemiplegia since 11/29/14  MRI  left frontotemporal and parietal ischemic infarcts. Small R PV ischemic infarct. Multifocal small brain mets.  Carotid Doppler unremarkable  TCD pending  2D Echo  Normal EF, no source of embolus   LDL 102  HgbA1c, 5.2 Diet NPO time specified  aspirin 81 mg orally every day, clopidogrel 75 mg orally every day and full dose lovenox prior to admission, was on ASA and heparin drip. 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 [facility])  Other Active Problems  Leukocytosis, no indication of infection  Elevated troponins   Hospital day # 7  Pt in comfort care with terminal extubation and morphine drip. Neurology will sign off. Please call with questions. Thanks for the consult.  Rosalin Hawking, MD PhD Stroke Neurology 12/21/14 2:05 PM       To contact Stroke Continuity provider, please refer to [email or web address] After hours, contact General Neurology

## 2014-12-26 NOTE — Progress Notes (Signed)
Patient's family called me to check if he is still breathing, noted patient has no respirations, no pulse, pupils are fixed and dilated, patient is a DNR, next of kin are present, Dr. Titus Mould was notified, time of death was at 17:25 on this date Dec 16, 2014, [facility] was also notified, no belongings. Eye prep done.

## 2014-12-26 NOTE — Progress Notes (Signed)
PULMONARY / CRITICAL CARE MEDICINE   Name: [name] MRN: [id number] DOB: [birth year]    ADMISSION DATE:  12/22/2014 CONSULTATION DATE:  Dec 12, 2014  REFERRING MD :  EDP  CHIEF COMPLAINT:  Speech difficulties  INITIAL PRESENTATION:   54 yo male with slurred speech from [facility].  He had recent dx of Stage IV NSCLC, PE, and has hx of CAD.  STUDIES:  CT head 5/2 >>> acute SAH within sulcus at the right parietal lobe EEG 5/03 >>> normal Echo 5/04 >>> EF 55 to 88%, grade 1 diastolic dysfx CT head 5/6 >>> new Rt occipital and Lt frontoparietal strokes  SIGNIFICANT EVENTS: 5/2 admit with Rt SAH 5/3 IVC filter placed 5/6 restroke overnight and new MI requiring intubation overnight. 5/8 - comfort care extubation  SUBJECTIVE: rr 29  VITAL SIGNS: Temp:  [100.4 F (38 C)] 100.4 F (38 C) (05/08 1150) Pulse Rate:  [79-106] 104 (05/09 0300) Resp:  [11-22] 11 (05/09 0300) BP: (108-121)/(58-73) 110/58 mmHg (05/09 0300) SpO2:  [92 %-100 %] 96 % (05/09 0300) FiO2 (%):  [40 %] 40 % (05/08 1700) VENTILATOR SETTINGS: Vent Mode:  [-] PRVC FiO2 (%):  [40 %] 40 % Set Rate:  [14 bmp] 14 bmp Vt Set:  [570 mL] 570 mL PEEP:  [5 cmH20] 5 cmH20 Pressure Support:  [5 cmH20] 5 cmH20 INTAKE / OUTPUT: Intake/Output      05/08 0701 - 05/09 0700 05/09 0701 - 05/10 0700   I.V. (mL/kg) 350.8 (5)    NG/GT 195    IV Piggyback     Total Intake(mL/kg) 545.8 (7.7)    Urine (mL/kg/hr) 550 (0.3)    Total Output 550     Net -4.2           PHYSICAL EXAMINATION: General: mild distress RR 29 Neuro: not moving ext HEENT: jvd Cardiovascular: regular, tachycardic Lungs: ronchi Abdomen: soft Musculoskeletal: No edema Skin:no rashes  LABS:  PULMONARY  Recent Labs Lab 11/30/14 0730 12/01/14 0323  PHART 7.417 7.461*  PCO2ART 31.2* 30.5*  PO2ART 357* 153*  HCO3 19.7* 21.4  TCO2 20.7 22.4  O2SAT 100.0 99.9   CBC  Recent Labs Lab 11/30/14 0213 11/30/14 0919 12/01/14 0400 12/02/14 0241  HGB  7.6* 7.5* 7.1* 6.7*  HCT 22.5* 21.5* 21.6* 21.0*  WBC 22.4*  --  23.4* 24.8*  PLT 270  --  236 269   COAGULATION  Recent Labs Lab 12/25/2014 1535  INR 1.10   CARDIAC    Recent Labs Lab 11/29/14 0951 11/30/14 0742 11/30/14 0919 11/30/14 1350 11/30/14 2016  TROPONINI 14.88* 19.18* 19.02* 33.68* 38.35*   CHEMISTRY  Recent Labs Lab 11/27/14 1228 11/28/14 0235 11/30/14 0213 12/01/14 0400 12/02/14 0241  NA 135 133* 133* 132* 136  K 3.7 4.2 3.5 4.0 4.0  CL 102 100* 99* 99* 103  CO2 22 22 21* 22 22  GLUCOSE 115* 143* 128* 150* 171*  BUN '17 15 14 '$ 33* 28*  CREATININE 0.84 0.82 0.84 1.29* 0.97  CALCIUM 9.1 9.4 9.1 8.9 8.8*  MG 1.8 1.9 2.0 2.4  --   PHOS 3.2 4.2 3.0 4.6  --    Estimated Creatinine Clearance: 89 mL/min (by C-G formula based on Cr of 0.97).  LIVER  Recent Labs Lab 12/04/2014 1236 12/23/2014 1535  AST 24 31  ALT 40 44  ALKPHOS 108 111  BILITOT <0.20 0.2*  PROT 7.2 7.8  ALBUMIN 3.6 4.3  INR  --  1.10   IMAGING x48h Dg Chest Port 1  View  12/02/2014   CLINICAL DATA:  Respiratory failure.  Shortness of breath.  EXAM: PORTABLE CHEST - 1 VIEW  COMPARISON:  Yesterday.  FINDINGS: Normal sized heart. Endotracheal tube in satisfactory position. Nasogastric tube tip in the proximal stomach. Clear lungs. Stable widened superior mediastinum. Unremarkable bones.  IMPRESSION: Stable superior mediastinal mass or adenopathy. No acute abnormality.   Electronically Signed   By: Claudie Revering M.D.   On: 12/02/2014 07:47   ASSESSMENT / PLAN:  NEUROLOGIC A:   Acute SAH on Admit. New large Rt CVA 5/03. Comfort care P:   rr 29, need to increase morphine to control distress D/w wife, will d/w mother  PULMONARY A: Acute respiratory failure 2nd to mental status. Hx of smoking. P:   Goal is comfort Titrate rr 12-18 and pain No ROLE increase O2  To floor  Lavon Paganini. Titus Mould, MD, [place] [facility]: [facility]

## 2014-12-26 DEATH — deceased

## 2014-12-27 ENCOUNTER — Ambulatory Visit: Payer: 59

## 2014-12-28 ENCOUNTER — Ambulatory Visit: Payer: 59

## 2014-12-31 ENCOUNTER — Ambulatory Visit: Payer: 59

## 2014-12-31 ENCOUNTER — Other Ambulatory Visit: Payer: 59

## 2015-01-01 ENCOUNTER — Ambulatory Visit: Payer: 59

## 2015-01-02 ENCOUNTER — Ambulatory Visit: Payer: 59

## 2015-01-03 ENCOUNTER — Ambulatory Visit: Payer: 59

## 2015-01-04 ENCOUNTER — Ambulatory Visit: Payer: 59

## 2015-01-07 ENCOUNTER — Ambulatory Visit: Payer: 59

## 2015-01-07 ENCOUNTER — Other Ambulatory Visit: Payer: 59

## 2015-01-08 ENCOUNTER — Ambulatory Visit: Payer: 59

## 2015-01-09 ENCOUNTER — Ambulatory Visit: Payer: 59

## 2015-01-10 ENCOUNTER — Ambulatory Visit: Payer: 59

## 2015-01-11 ENCOUNTER — Ambulatory Visit: Payer: 59

## 2015-01-14 ENCOUNTER — Ambulatory Visit: Payer: 59

## 2015-01-15 ENCOUNTER — Ambulatory Visit: Payer: 59

## 2016-01-13 ENCOUNTER — Other Ambulatory Visit: Payer: Self-pay | Admitting: Nurse Practitioner

## 2016-08-11 IMAGING — CT NM PET TUM IMG INITIAL (PI) SKULL BASE T - THIGH
1 of 8 series · 1 of 25 positions shown · non-contrast
Comparison: 09/21/2014

CLINICAL DATA: Initial treatment strategy for Lung cancer.

EXAM:
NUCLEAR MEDICINE PET SKULL BASE TO THIGH
TECHNIQUE: 9.8 mCi F-18 FDG was injected intravenously. Full-ring PET imaging
was performed from the skull base to thigh after the radiotracer. CT
data was obtained and used for attenuation correction and anatomic
localization.
FASTING BLOOD GLUCOSE:  Value: 97 mg/dl

[Series 4: ct sk_thigh 5.0 b31f · axial · 5.0mm · 0.89mm/px · 1 of 237 slices shown]
[im 237/237  brain]
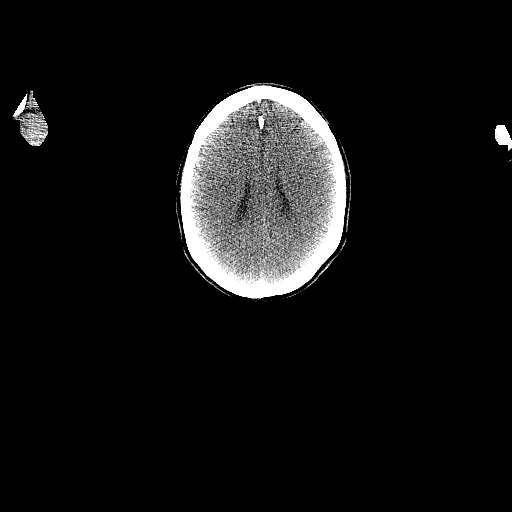

[1 of 25 positions shown; findings below may reference images not displayed]

FINDINGS: NECK

Left supraclavicular lymph node measures 8 mm and has an SUV max
equal to 3.8. Lymph node posterior to right lobe of thyroid gland
measures 1 cm. This has an SUV max equal to 3.8.

CHEST

There are hypermetabolic bilateral mediastinal lymph nodes. Right
paratracheal lymph node measures 8 mm and has an SUV max equal to
6.14. Hypermetabolic sub- carinal lymph node measures 1.1 cm and has
an SUV max equal to 6.7. Hypermetabolic left hilar lymph node
measures 3.2 cm and has an SUV max equal to 5.9. Within the left
upper lobe there is a central perihilar lesion which measures 2.3 x
1.6 x 2.5 cm and has an SUV max equal to 10.2. Within the left apex
there is a small satellite nodule measuring 8 mm. This has an SUV
max equal to 2.5.

ABDOMEN/PELVIS

No abnormal hypermetabolic activity within the liver, pancreas,
adrenal glands, or spleen. There is calcified atherosclerotic
disease involving the abdominal aorta and its branches. No aneurysm.
No hypermetabolic lymph nodes in the abdomen or pelvis.

SKELETON

No focal hypermetabolic activity to suggest skeletal metastasis.
IMPRESSION: 1. Left upper lobe pulmonary nodule exhibits malignant range FDG
uptake and is worrisome for primary bronchogenic carcinoma. Assuming
non-small cell histology this is consistent with at least a ZLYLP3
or Stage IIIB

## 2016-09-14 IMAGING — CR DG CHEST 2V
2 series · 2 of 2 positions shown · non-contrast
Comparison: 09/21/2014

CLINICAL DATA: Preop, mass in the left upper lobe with adenopathy

EXAM:
CHEST  2 VIEW

[w chest pa]
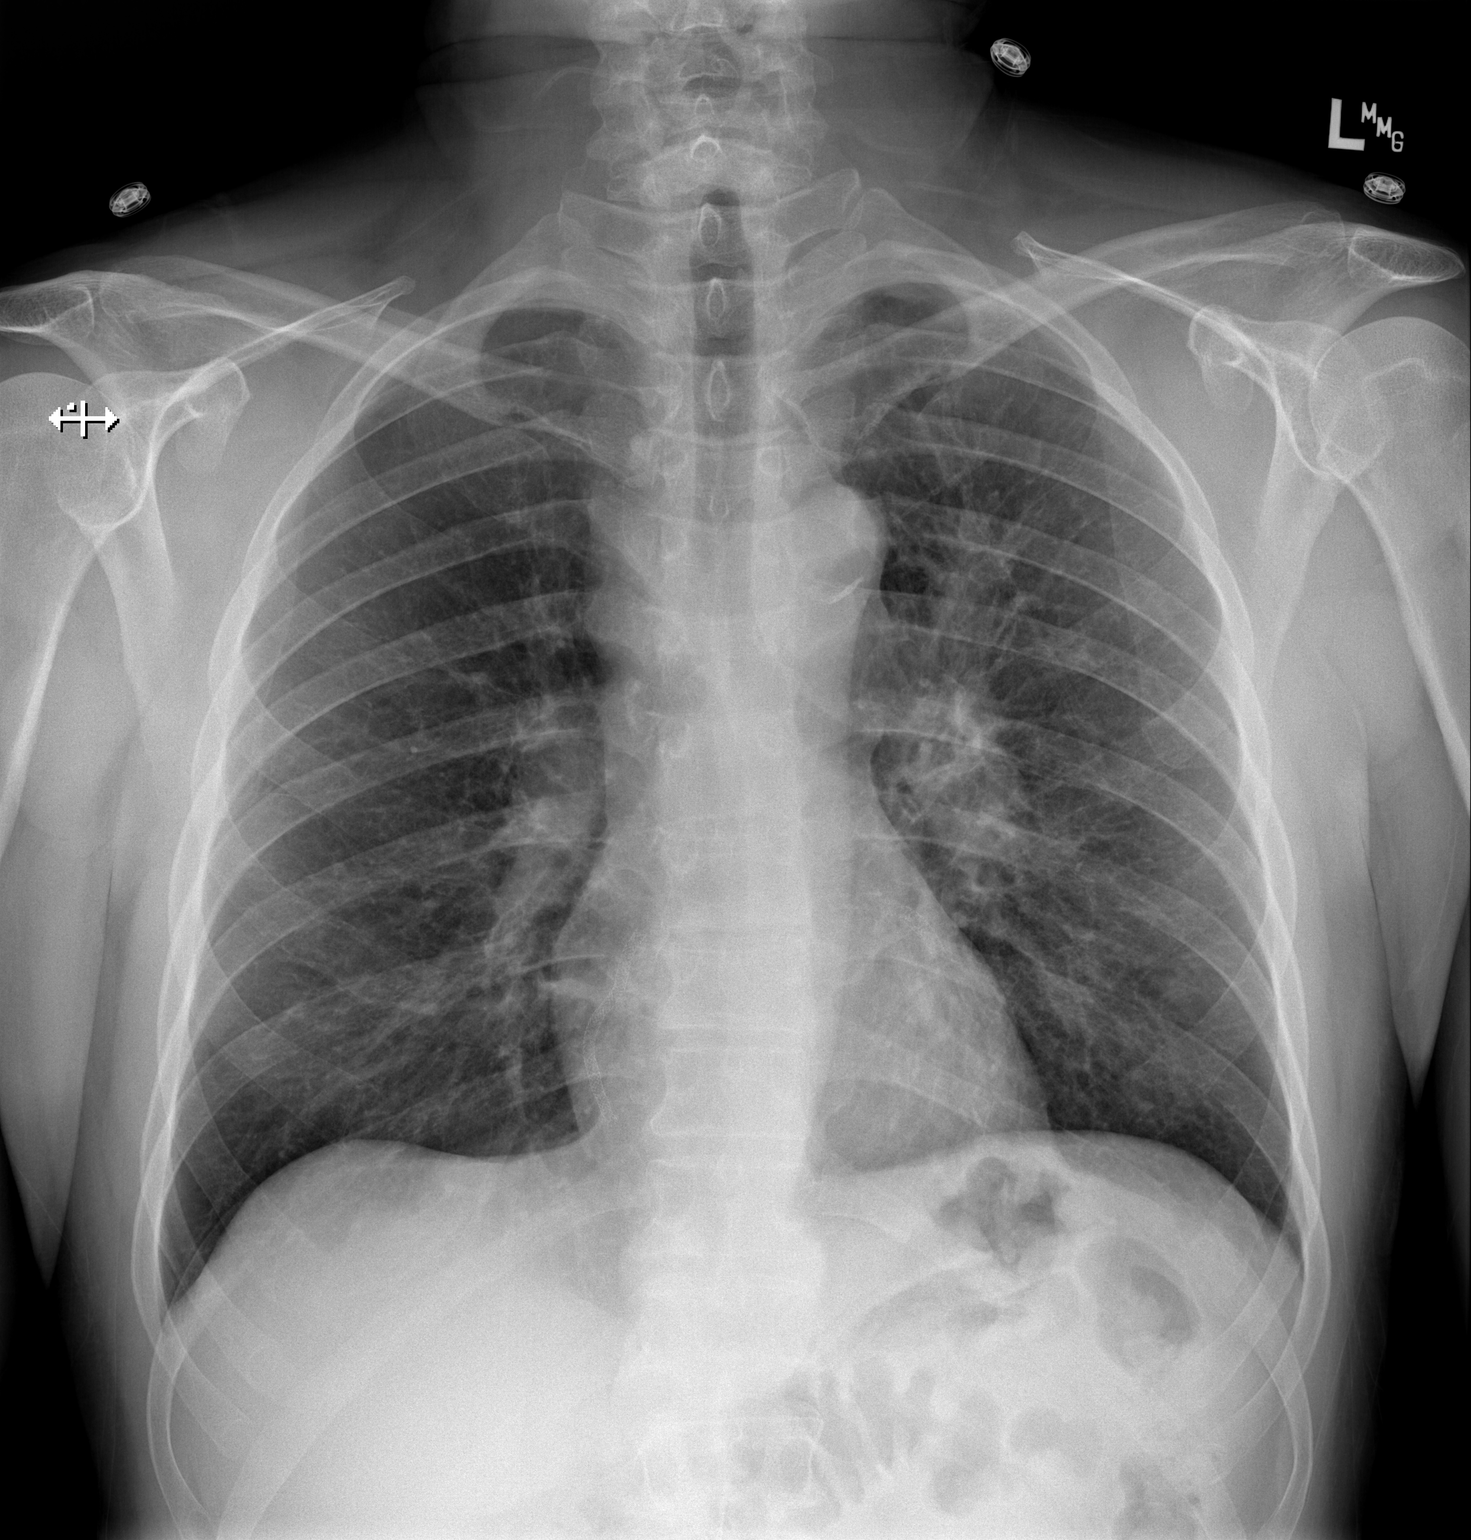

[w chest lat]
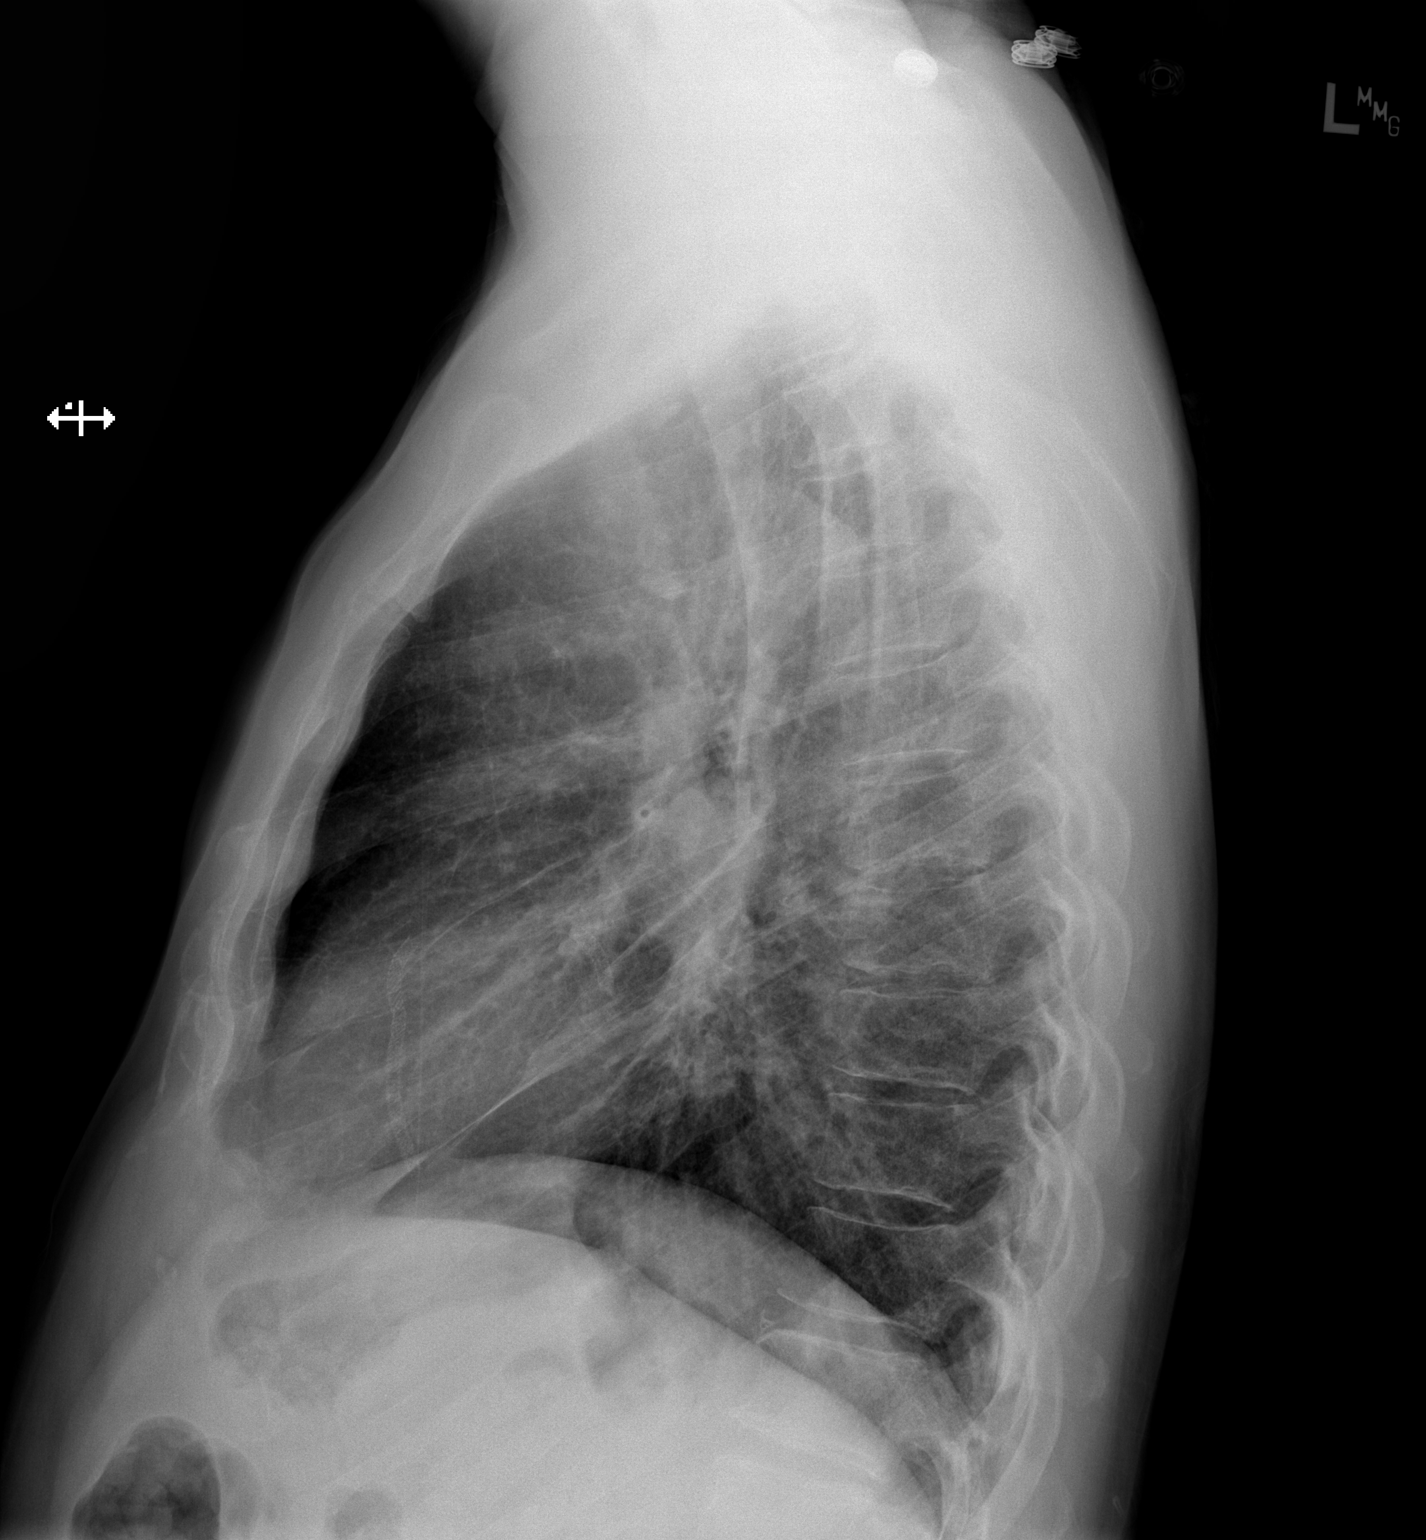

[2 of 2 positions shown; findings below may reference images not displayed]

FINDINGS: Stable appearance of the left suprahilar mass with left hilar and
right peritracheal enlargement. Normal heart size and aortic
contours. Bilateral coronary stents are noted. There is no edema,
consolidation, effusion, or pneumothorax.
IMPRESSION: 1. No active cardiopulmonary disease.
2. Grossly stable left hilar mass.
